# Patient Record
Sex: Female | Born: 1960 | Race: White | Hispanic: No | Marital: Married | Smoking: Former smoker
Health system: Southern US, Community
[De-identification: ages and names within clinical notes are randomized; demographics above are authoritative.]

## PROBLEM LIST (undated history)

## (undated) DIAGNOSIS — J45909 Unspecified asthma, uncomplicated: Secondary | ICD-10-CM

## (undated) DIAGNOSIS — K589 Irritable bowel syndrome without diarrhea: Secondary | ICD-10-CM

## (undated) DIAGNOSIS — R87610 Atypical squamous cells of undetermined significance on cytologic smear of cervix (ASC-US): Secondary | ICD-10-CM

## (undated) DIAGNOSIS — M549 Dorsalgia, unspecified: Secondary | ICD-10-CM

## (undated) HISTORY — PX: ABDOMINAL HYSTERECTOMY: SHX81

## (undated) HISTORY — DX: Atypical squamous cells of undetermined significance on cytologic smear of cervix (ASC-US): R87.610

## (undated) HISTORY — PX: OOPHORECTOMY: SHX86

## (undated) HISTORY — DX: Dorsalgia, unspecified: M54.9

## (undated) HISTORY — PX: PELVIC LAPAROSCOPY: SHX162

## (undated) HISTORY — PX: DILATION AND CURETTAGE OF UTERUS: SHX78

## (undated) HISTORY — PX: OTHER SURGICAL HISTORY: SHX169

## (undated) HISTORY — DX: Irritable bowel syndrome, unspecified: K58.9

## (undated) HISTORY — DX: Unspecified asthma, uncomplicated: J45.909

---

## 1998-08-08 ENCOUNTER — Encounter: Payer: Self-pay | Admitting: Gastroenterology

## 1998-08-08 ENCOUNTER — Ambulatory Visit (HOSPITAL_COMMUNITY): Admission: RE | Admit: 1998-08-08 | Discharge: 1998-08-08 | Payer: Self-pay | Admitting: Gastroenterology

## 2002-04-27 ENCOUNTER — Ambulatory Visit (HOSPITAL_BASED_OUTPATIENT_CLINIC_OR_DEPARTMENT_OTHER): Admission: RE | Admit: 2002-04-27 | Discharge: 2002-04-27 | Payer: Self-pay | Admitting: Orthopedic Surgery

## 2003-10-22 ENCOUNTER — Other Ambulatory Visit: Admission: RE | Admit: 2003-10-22 | Discharge: 2003-10-22 | Payer: Self-pay | Admitting: Obstetrics and Gynecology

## 2003-10-23 ENCOUNTER — Other Ambulatory Visit: Admission: RE | Admit: 2003-10-23 | Discharge: 2003-10-23 | Payer: Self-pay | Admitting: Obstetrics and Gynecology

## 2003-11-28 ENCOUNTER — Encounter (INDEPENDENT_AMBULATORY_CARE_PROVIDER_SITE_OTHER): Payer: Self-pay | Admitting: Specialist

## 2003-11-28 ENCOUNTER — Ambulatory Visit (HOSPITAL_COMMUNITY): Admission: RE | Admit: 2003-11-28 | Discharge: 2003-11-28 | Payer: Self-pay | Admitting: Obstetrics and Gynecology

## 2005-10-29 ENCOUNTER — Encounter (INDEPENDENT_AMBULATORY_CARE_PROVIDER_SITE_OTHER): Payer: Self-pay | Admitting: Specialist

## 2005-10-29 ENCOUNTER — Ambulatory Visit (HOSPITAL_COMMUNITY): Admission: RE | Admit: 2005-10-29 | Discharge: 2005-10-29 | Payer: Self-pay | Admitting: Obstetrics and Gynecology

## 2006-01-06 ENCOUNTER — Inpatient Hospital Stay (HOSPITAL_COMMUNITY): Admission: RE | Admit: 2006-01-06 | Discharge: 2006-01-08 | Payer: Self-pay | Admitting: Obstetrics and Gynecology

## 2006-01-06 ENCOUNTER — Encounter (INDEPENDENT_AMBULATORY_CARE_PROVIDER_SITE_OTHER): Payer: Self-pay | Admitting: Specialist

## 2009-03-06 ENCOUNTER — Encounter: Admission: RE | Admit: 2009-03-06 | Discharge: 2009-03-06 | Payer: Self-pay | Admitting: Family Medicine

## 2009-11-05 ENCOUNTER — Ambulatory Visit: Payer: Self-pay | Admitting: Gynecology

## 2009-11-05 ENCOUNTER — Other Ambulatory Visit: Admission: RE | Admit: 2009-11-05 | Discharge: 2009-11-05 | Payer: Self-pay | Admitting: Gynecology

## 2009-11-11 ENCOUNTER — Ambulatory Visit: Payer: Self-pay | Admitting: Gynecology

## 2010-04-01 ENCOUNTER — Emergency Department (HOSPITAL_COMMUNITY)
Admission: EM | Admit: 2010-04-01 | Discharge: 2010-04-01 | Disposition: A | Discharge status: Home / Self Care | Payer: BC Managed Care – PPO | Attending: Emergency Medicine | Admitting: Emergency Medicine

## 2010-04-01 DIAGNOSIS — IMO0001 Reserved for inherently not codable concepts without codable children: Secondary | ICD-10-CM | POA: Insufficient documentation

## 2010-04-01 DIAGNOSIS — M545 Low back pain, unspecified: Secondary | ICD-10-CM | POA: Insufficient documentation

## 2010-04-01 DIAGNOSIS — M533 Sacrococcygeal disorders, not elsewhere classified: Secondary | ICD-10-CM | POA: Insufficient documentation

## 2010-04-01 DIAGNOSIS — Z79899 Other long term (current) drug therapy: Secondary | ICD-10-CM | POA: Insufficient documentation

## 2010-04-01 DIAGNOSIS — K589 Irritable bowel syndrome without diarrhea: Secondary | ICD-10-CM | POA: Insufficient documentation

## 2010-06-20 NOTE — Op Note (Signed)
NAME:  Deborah Jennings, Deborah Jennings NO.:  [id number]   MEDICAL RECORD NO.:  [id number]          PATIENT TYPE:  AMB   LOCATION:  SDC                           FACILITY:  [facility]   PHYSICIAN:  Malva Limes, M.D.    DATE OF BIRTH:  [birth year]   DATE OF PROCEDURE:  11/28/2003  DATE OF DISCHARGE:                                 OPERATIVE REPORT   PREOPERATIVE DIAGNOSES:  Incomplete abortion at nine weeks estimated  gestational age.   POSTOPERATIVE DIAGNOSES:  Incomplete abortion at nine weeks estimated  gestational age.   PROCEDURE:  Dilation and curettage.   SURGEON:  Malva Limes, M.D.   ANESTHESIA:  MAC with paracervical block.   DRAINS:  None.   SPECIMENS:  Products of conception sent to pathology.   ANTIBIOTICS:  Ancef 1 g.   COMPLICATIONS:  None.   ESTIMATED BLOOD LOSS:  20 mL.   DESCRIPTION OF PROCEDURE:  The patient was taken to the operating room where  she was placed in dorsal supine position. The MAC anesthesia was then  administered, she was placed in dorsal lithotomy position, she was prepped  with Hibiclens and draped in the usual fashion for this procedure. An  examination revealed an anteverted uterus approximately eight weeks in size.  A sterile speculum was placed in the vagina, 15 mL of 1% lidocaine was used  for paracervical block. A single tooth tenaculum was applied to the anterior  cervical lip. The cervical os was then serially dilated to a 21 [place]. An 8  mm suction cannula was placed into the uterine cavity and products of  conception were withdrawn. Sharp curettage was then performed followed by  repeat suction. 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  It appeared  that very little of this fibroid protruded into the cavity.  This concluded  the procedure. The patient was taken to the recovery room in stable  condition. She will be discharged to home with Keflex  500 mg q.i.d. for two  days, Darvocet to take p.r.n.  She will be instructed to followup in the  office in four weeks. She will be given RhoGAM is Rh negative.     Mark   MA/MEDQ  D:  11/28/2003  T:  11/28/2003  Job:  [id number]

## 2010-06-20 NOTE — Discharge Summary (Signed)
NAMEWRENN, WILLCOX               ACCOUNT NO.:  [id number]   MEDICAL RECORD NO.:  [id number]          PATIENT TYPE:  INP   LOCATION:  9302                          FACILITY:  [facility]   PHYSICIAN:  Juluis Mire, M.D.   DATE OF BIRTH:  03-13-1960   DATE OF ADMISSION:  01/06/2006  DATE OF DISCHARGE:  01/08/2006                               DISCHARGE SUMMARY   ADMISSION DIAGNOSIS:  Pelvic pain secondary to pelvic adhesions.   DISCHARGE DIAGNOSIS:  Pelvic pain secondary to pelvic adhesions.   OPERATIVE PROCEDURE:  Diagnostic laparoscopy.  Subsequent exploratory  surgery with total abdominal hysterectomy, left salpingo-oophorectomy.   HISTORY:  For complete history and physical, please see dictated note.   COURSE IN THE HOSPITAL:  After laparoscopy noted that she had  significant pelvic adhesions involving the cul-de-sac, decision to  proceed with exploratory surgery.  We did exploratory laparotomy with  TAH-LSO.  Postoperatively she did excellent.  Postop hemoglobin was  11.8.  Discharged home on the second postop day.  At that time she was  tolerating a regular diet and ambulating without difficulty.  She had a  bowel movement, was voiding without difficulty.  No active vaginal  bleeding.  Incision and abdominal exam were benign.  She was completely  afebrile with stable vital signs.   There were no complications encountered during her stay in the hospital.  The patient was discharged home in stable condition.   DISPOSITION:  The patient to avoid heavy lifting, vaginal entrance or  driving of a car.  She is cautioned to watch for signs of infection,  nausea or vomiting, increased abdominal pain, active vaginal bleeding or  signs of phlebitis or pulmonary embolus.  Discharged home on Tylox as  needed for pain.  She will be followed up early next week in the office  to remove staples.      Juluis Mire, M.D.  Electronically Signed     JSM/MEDQ  D:  01/08/2006  T:   01/08/2006  Job:  [id number]

## 2010-06-20 NOTE — H&P (Signed)
Deborah Jennings, Deborah Jennings               ACCOUNT NO.:  [id number]   MEDICAL RECORD NO.:  [id number]          PATIENT TYPE:  AMB   LOCATION:  SDC                           [facility]ILITY[facility]   PHYSICIAN:  Juluis Mire, M.D.   DATE OF BIRTH:  [birth year]   DATE OF ADMISSION:  01/06/2006  DATE OF DISCHARGE:                              HISTORY & PHYSICAL   The patient is a 50 year old gravida 1, para 1, female who presents for  laparoscopic-assisted vaginal hysterectomy.   In relation to the present admission, the patient's cycles have  generally been between 21 and 28 days.  She has 5 days of flow with 2-  1/2 to 3 days being extremely heavy, changing pads and tampons every  hour with associated clots.  She is also having progressive pain and  discomfort, particularly on the left side.  She has extreme pain with  intercourse.  We had done a previous laparoscopy, hysteroscopy.  We did  find pelvic adhesions.  These were taken down.  Despite this, the  patient continues to have pain requiring higher doses of pain  medication.  In view of this, the patient now presents for  laparoscopically-assisted vaginal hysterectomy.   ALLERGIES:  She is allergic to AMPICILLIN.   MEDICATION:  Levbid.   PAST MEDICAL HISTORY:  The usual childhood diseases.  Does have a  history of irritable bowel syndrome.   PAST SURGICAL HISTORY:  She had the above-noted laparoscopy and  hysteroscopy as noted above.  She has also had a prior cesarean section  for breech presentation.   SOCIAL HISTORY:  No tobacco or alcohol use.   FAMILY HISTORY:  Father has a history of diabetes as well as  hypertension.   REVIEW OF SYSTEMS:  Noncontributory.   PHYSICAL EXAMINATION:  VITAL SIGNS:  The patient is afebrile with stable  vital signs.  HEENT:  Patient normocephalic.  Pupils equal, round and reactive to  light and accommodation.  Extraocular movements were intact.  Sclerae  and conjunctivae clear.  Oropharynx clear.  NECK:  Without thyromegaly.  BREASTS:  No discrete masses.  LUNGS:  Clear.  CARDIAC:  Regular rate and rhythm, no murmurs or gallops.  ABDOMEN:  Benign.  Does have left-sided tenderness.  PELVIC:  Normal external genitalia.  Vaginal mucosa clear.  Cervix  unremarkable.  Uterus upper limits of normal size, moderately tender.  Adnexa unremarkable.  EXTREMITIES:  Trace edema.  NEUROLOGIC:  Grossly within normal limits.   IMPRESSION:  Continued abnormal bleeding and pelvic pain secondary to  adenomyosis and pelvic adhesions.   PLAN:  The patient will undergo a laparoscopically-assisted vaginal  hysterectomy with left salpingo-oophorectomy.  She does understand that  this may not completely relieve pain symptoms.  The risks of the  procedure have been discussed, including the risk of infection; the risk  of hemorrhage that could require transfusion with the risk of AIDS or  hepatitis.  There is a risk of injury to adjacent organs including the  bladder, bowel or ureters that could require further exploratory  surgery.  Risk of deep venous thrombosis and  pulmonary emboli.  There is  also a risk of injury with placement during gynecological procedures  that can lead to nerve or muscular damage.  The patient expressed an  understanding of indications and risks.      Juluis Mire, M.D.  Electronically Signed     JSM/MEDQ  D:  01/06/2006  T:  01/06/2006  Job:  [id number]

## 2010-06-20 NOTE — Op Note (Signed)
NAME:  Deborah Jennings, Deborah Jennings                       ACCOUNT NO.:  [id number]   MEDICAL RECORD NO.:  [id number]                   PATIENT TYPE:  AMB   LOCATION:  DSC                                  FACILITY:  [facility]   PHYSICIAN:  Katy Fitch. Naaman Plummer., M.D.          DATE OF BIRTH:  Jun 02, 1960   DATE OF PROCEDURE:  04/27/2002  DATE OF DISCHARGE:                                 OPERATIVE REPORT   PREOPERATIVE DIAGNOSIS:  Entrapment neuropathy, median nerve, right carpal  tunnel.   POSTOPERATIVE DIAGNOSIS:  Entrapment neuropathy, median nerve, right carpal  tunnel.   PROCEDURE:  Release of right transverse carpal ligament.   SURGEON:  Katy Fitch. Sypher, M.D.   ASSISTANT:  Jonni Sanger, P.A.   ANESTHESIA:  General by LMA supervised by the anesthesiologist, Maren Beach, M.D.   INDICATIONS:  The patient is a 50 year old woman who presented for  evaluation and management of numbness in her right hand.  Clinical  examination revealed signs of carpal tunnel syndrome.  Electrodiagnostic  studies confirmed median neuropathy at the level of the right wrist.   After failure to respond to nonoperative measures, she is brought to the  operating room at this time for release of her right transverse carpal  ligament.   DESCRIPTION OF PROCEDURE:  The patient was brought to the operating room and  placed in the supine position on the operating table.  Following induction  of general anesthesia by LMA, the right arm was prepped with Betadine soap  and solution and sterilely draped.   Following exsanguination of the limb with an Esmarch bandage, the arterial  tourniquet was inflated to 220 mmHg.  The procedure commenced with a short  incision in the line of the ring finger of the palm.  The subcutaneous  tissues were carefully divided to reveal the palmar fascia.  This was split  longitudinally to reveal the common sensory branch of the median nerve.   These were followed back to the  transverse carpal ligament, which was  carefully isolated from the median nerve.   Bleeding points along the margin of the released ligament were  electrocauterized with bipolar current, followed by repair of the skin with  intradermal 3-0 Prolene suture.   A compressive dressing applied with a volar plaster splint maintaining the  wrist in 5 degrees of dorsiflexion.   For aftercare the patient is given a prescription for Percocet 5 mg one or  two tablets p.o. q.4-6h. p.r.n. pain, 20 tablets without refill.   She will return to our office for follow-up in seven to 10 days for dressing  change and advancement to an exercise program.  Anticipate suture removal at  10-14 days.  Katy Fitch Naaman Plummer., M.D.    RVS/MEDQ  D:  04/27/2002  T:  04/28/2002  Job:  [id number]

## 2010-06-20 NOTE — Op Note (Signed)
Deborah Jennings, Deborah Jennings               ACCOUNT NO.:  [id number]   MEDICAL RECORD NO.:  [id number]          PATIENT TYPE:  AMB   LOCATION:  SDC                           [facility]ACILITY[facility]   PHYSICIAN:  Juluis Mire, M.D.   DATE OF BIRTH:  [birth year]   DATE OF PROCEDURE:  01/06/2006  DATE OF DISCHARGE:                               OPERATIVE REPORT   PREOPERATIVE DIAGNOSIS:  Pelvic pain secondary to pelvic adhesions.   POSTOPERATIVE DIAGNOSIS:  Pelvic pain secondary to pelvic adhesions.   OPERATIVE PROCEDURE:  1. Open laparoscopy.  2. Attempt at lysis of adhesions.  3. Subsequent total abdominal hysterectomy with left salpingo-      oophorectomy.   SURGEON:  Juluis Mire, M.D.   ASSISTANT:  Duke Salvia. Marcelle Overlie, M.D.   ANESTHESIA:  General endotracheal.   ESTIMATED BLOOD LOSS:  300-400 mL.   PACKS AND DRAINS:  None.   INTRAOPERATIVE BLOOD REPLACEMENT:  None.   COMPLICATIONS:  None.   INDICATIONS:  In dictated history and physical.   PROCEDURE:  The patient was taken to the OR and placed in the supine  position.  After a satisfactory level of general endotracheal anesthesia  was obtained, the patient was placed in dorsal lithotomy position using  the Allen stirrups.  The abdomen, perineum and vagina were prepped out  with Betadine.  Bladder was emptied by in-and-out catheterization.  A  Hulka tenaculum was put in place and secured.  The patient then draped  in a sterile field.  A subumbilical incision made with a knife and  extended through the subcutaneous tissue.  The fascia was identified,  entered sharply and the incision in fascia was extended laterally.  We  entered the peritoneum with blunt finger pressure.  The Taut open  laparoscopic trocar was put in place and secured.  The laparoscope was  introduced.  There was no evidence of injury to adjacent organs.  A 5-mm  trocar was put in place in the suprapubic area under direct  visualization.  We put in a third 5-mm  trocar in the left lower quadrant  because of the pelvic adhesions.  At this point in time, we elevated the  uterus.  We were able to free up the left ovary and the right ovary  appeared to be normal, except for some implants of endometriosis.  However, at this point in time, the cul-de-sac had become completely  obliterated between adhesions from the sigmoid colon to the back to the  uterus and the left pelvic sidewall.  These appeared to be dense and we  felt like we could not approach these through the laparoscope; the  decision was to proceed with total abdominal hysterectomy.  At this  point in time, the abdomen was deflated of its carbon dioxide and all  trocars removed.  The patient's legs were repositioned.  The Hulka  tenaculum was removed.  A Foley was placed to straight drain.  A low  transverse skin incision was made with a knife and carried through  subcutaneous tissue.  The anterior rectus fascia was entered sharply and  incision in the fascia was extended laterally.  The fascia was then  taken off the muscle superiorly and inferiorly.  Muscles were separated  in the midline.  Peritoneum was entered and incision in the peritoneum  extended both superiorly and inferiorly.  O'Connor-O'Sullivan retractor  was put in place and bowel contents were packed superiorly out of the  pelvic cavity.  Utero-ovarian ligaments were clamped with Kellys.  The  uterus was then elevated.  We were able to dissect the sigmoid colon off  the back the uterus and developed the cul-de-sac at this point using  both blunt and sharp dissection; there was no evidence of injury to the  bowel.  At this point in time, the right round ligament was clamped, cut  and suture-ligated with 0 Vicryl.  The right utero-ovarian pedicle was  developed, clamped, cut and doubly ligated, first with a free tie of 0  Vicryl, then a suture ligature of 0 Vicryl.  Next, the left round  ligament was clamped, cut and  suture-ligated with 0 Vicryl.  We isolated  the ovarian vasculature above the ureter; it was clamped and cut and  doubly ligated first with free tie of 0 Vicryl, then a suture ligature  of 0 Vicryl.  Bladder flap was then developed. Uterine vessels were  skeletonized, clamped, cut and suture-ligated with 0 Vicryl.  Using the  clamp, cut and tie technique with suture ligatures of 0 Vicryl, the  parametrium was serially separated from the sides of the uterus.  We  made sure that the colon was out of the way.  Next, both vaginal angles  were clamped and cut, intervening vaginal mucosa was excised, uterus and  cervix and left ovary passed off the operative field and sent to  Pathology.  At this point in time, vaginal angles were secured with a  suture ligature of 0 Vicryl.  The intervening vaginal mucosa was closed  with a running suture of 0 Vicryl.  We thoroughly irrigated the pelvis;  we had good hemostasis.  The appendix was visualized and noted be  normal.  The right ovary was hemostatically intact and out of the  pelvis.  The left ovarian vasculature was hemostatically intact.  She  had minimal urine output throughout the case; however, it was clear.  At  this point in time, all laps and self-retaining tractors removed.  Muscles and peritoneum were closed with a running suture of 2-0 Vicryl,  fascia closed with a running suture of 0 PDS and skin was closed with  staples and Steri-Strips.  At this point in time, subumbilical fascia  was closed with 2 figure-of-eights of 0 Vicryl, skin with interrupted  subcuticulars of 4-0 Vicryl.  The suprapubic and lateral 5-mm trocar  incisions were closed with Dermabond.  The patient was taken out of the  dorsal lithotomy position and once alert and extubated, was transferred  to the recovery room in good condition.  Sponge, instrument and needle  counts were reported as correct by circulating nurse x2.     Juluis Mire, M.D.  Electronically  Signed     JSM/MEDQ  D:  01/06/2006  T:  01/06/2006  Job:  [id number]

## 2010-06-20 NOTE — H&P (Signed)
Deborah Jennings, Deborah Jennings               ACCOUNT NO.:  [id number]   MEDICAL RECORD NO.:  [id number]          PATIENT TYPE:  AMB   LOCATION:  SDC                           FACILITY:  [facility]   PHYSICIAN:  Juluis Mire, M.D.   DATE OF BIRTH:  1960-04-16   DATE OF ADMISSION:  10/29/2005  DATE OF DISCHARGE:                                HISTORY & PHYSICAL   A 50 year old  gravida 1, para 1 married female presents for hysteroscopy  with laparoscopy with laser standby.   In relation to the present admission the patient's cycles have generally  been between 21 at 28 days.  She has 5 days of flow, 2-1/2 days to 3 days  being extremely heavy, changing pads and tampons every hour with clots.  She  is also reporting increasing abdominal pain and bloating, particularly on  the left side.  With intercourse she also has discomfort on that side.  Ultrasound evaluation; it looked like the ovaries were somewhat fixed in the  pelvis.  Findings highly suggestive of endometriosis.  The patient now  presents for the above-noted surgery.   ALLERGIES:  IN TERMS OF ALLERGIES ALLERGIC TO AMPICILLIN.   MEDICATIONS:  Levbid.   PAST MEDICAL HISTORY:  Usual childhood diseases.  Does have a history of  irritable bowel syndrome.   SOCIAL HISTORY:  Reveals no tobacco or alcohol use.   FAMILY HISTORY:  Strong history of diabetes and hypertension.   REVIEW OF SYSTEMS:  Noncontributory.   PHYSICAL EXAMINATION:  The patient is afebrile with stable vital signs.  HEENT:  The patient is normocephalic.  Pupils equal, round, reactive to  light and accommodation.  Extraocular movements were intact.  Sclerae and  conjunctiva clear.  Oropharynx clear.  NECK:  Without thyromegaly.  BREASTS :  No discrete masses.  LUNGS:  Clear.  CARDIOVASCULAR:  Regular rhythm and rate without murmurs, rubs or gallops.  ABDOMEN:  Benign.  No mass, organomegaly or tenderness.  PELVIC:  Normal external genitalia.  Vaginal mucosa is clear.   Cervix is  unremarkable.  Uterus is of normal size and shape.  Uterus is somewhat  fixed.  Adnexa unremarkable.  EXTREMITIES:  Trace edema.  NEUROLOGIC:  Grossly within normal limits.   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   PLAN:  The patient will undergo hysteroscopy with subsequent diagnostic  laparoscopy with laser standby.  The overall risks of surgery have been  discussed including the risk of infection.  The risk of hemorrhage which  could  require transfusion with the risk of AIDS or hepatitis.  The risk of injury  to adjacent organs requiring further exploratory surgery.  The risk of deep  venous thrombosis and pulmonary emboli.  The patient voiced understanding of  indications and risks and accepting of them.      Juluis Mire, M.D.  Electronically Signed     JSM/MEDQ  D:  10/29/2005  T:  10/30/2005  Job:  [id number]

## 2010-06-20 NOTE — Op Note (Signed)
Deborah Jennings, Deborah Jennings               ACCOUNT NO.:  [id number]   MEDICAL RECORD NO.:  [id number]          PATIENT TYPE:  AMB   LOCATION:  SDC                           FACILITY:  [facility]   PHYSICIAN:  Juluis Mire, M.D.   DATE OF BIRTH:  [birth year]   DATE OF PROCEDURE:  10/29/2005  DATE OF DISCHARGE:  10/29/2005                                 OPERATIVE REPORT   PREOPERATIVE DIAGNOSIS:  Abnormal bleeding and pelvic pain.   POSTOPERATIVE DIAGNOSIS:  Pelvic adhesions.   OPERATIVE PROCEDURE:  Hysteroscopy with D&C. Laparoscopy with lysis of  adhesions.   SURGEON:  Juluis Mire, M.D.   ANESTHESIA:  General.   ESTIMATED BLOOD LOSS:  Minimal.   PACKS AND DRAINS:  None.   INTRAOPERATIVE BLOOD REPLACED:  None.   COMPLICATIONS:  None.   INDICATIONS:  Dictated in history and physical.   PROCEDURE IN DETAIL:  The patient was taken to the OR and placed in supine  position. After satisfactory level of general endotracheal anesthesia was  obtained, the patient was placed in a dorsal lithotomy position using the  Allen stirrups. The abdomen, perineum and vagina were prepped out with  Betadine. The patient was then draped out for hysteroscopy. Speculum was  placed in the vaginal vault. Cervix was grasped with a single-toothed  tenaculum. Uterus sounded to approximately 11 cm. Cervix was dilated to a  size 27 Pratt dilator. Nonoperative hysteroscopy was introduced.  Intrauterine cavity was distended using Sorbitol. Visualization revealed  normal endometrial cavity. There was no polyps or abnormalities.  Hysteroscope was then removed. Total deficit was 60 cc. Endometrial  curettings were then obtained and sent for pathological review. The Hulka  tenaculum was then put in place and secured.   The patient's legs were repositioned, and the patient was redraped. A  subumbilical incision was made with a knife. Veress needle was introduced  into the abdominal pain. The abdomen was insufflated  with approximately 3  liters of carbon dioxide. The operating laparoscope was introduced into the  abdominal cavity without difficulty. A 5-mm trocar was put into place in the  suprapubic area under direct visualization. There was no evidence of injury  to adjacent organs. Appendix was seen and found to be normal. Upper abdomen  including liver tip and gallbladder were cleared. Both lateral gutters were  cleared. The uterus was elevated. The uterus was the upper limits of normal  size, irregular, consistent with adenomyosis. Both ovaries were adherent  from the cul-de-sac. We were able to free using blunt and sharp dissection  both ovaries from the pelvic side wall. They were otherwise unremarkable. No  active endometriosis noted. Sigmoid colon was somewhat adherent to the  posterior aspect of the uterus and the cul-de-sac, at least in the lower  portion. This was left in place. We used a bipolar to control any bleeding  from the ovaries. No other pelvic pathology was noted at this point in time.  The pelvic cavity was thoroughly irrigated. We had good hemostasis. The  abdomen was deflated of carbon dioxide. All trocars removed. Subumbilical  incisions were  closed with interrupted subcuticular of 4-0 Vicryl. The  suprapubic incisions were closed with Dermabond. The Hulka tenaculum was  then removed. The patient was taken out of the dorsal lithotomy position.  Once alert and extubated, transferred to the recovery room in good  condition. Sponge, instrument and needle count ______________ circulating  nurse x2.      Juluis Mire, M.D.  Electronically Signed     JSM/MEDQ  D:  10/29/2005  T:  10/31/2005  Job:  [id number]

## 2011-02-09 ENCOUNTER — Encounter: Payer: Self-pay | Admitting: Gynecology

## 2011-02-11 ENCOUNTER — Other Ambulatory Visit: Payer: Self-pay | Admitting: *Deleted

## 2011-02-11 DIAGNOSIS — R921 Mammographic calcification found on diagnostic imaging of breast: Secondary | ICD-10-CM

## 2011-02-13 ENCOUNTER — Other Ambulatory Visit: Payer: Self-pay | Admitting: *Deleted

## 2011-02-13 ENCOUNTER — Other Ambulatory Visit: Payer: Self-pay | Admitting: Gynecology

## 2011-02-13 DIAGNOSIS — R921 Mammographic calcification found on diagnostic imaging of breast: Secondary | ICD-10-CM

## 2011-02-13 DIAGNOSIS — R928 Other abnormal and inconclusive findings on diagnostic imaging of breast: Secondary | ICD-10-CM

## 2011-02-16 ENCOUNTER — Other Ambulatory Visit: Payer: Self-pay | Admitting: Radiology

## 2011-02-17 ENCOUNTER — Other Ambulatory Visit: Payer: Self-pay | Admitting: Gynecology

## 2011-02-17 DIAGNOSIS — R928 Other abnormal and inconclusive findings on diagnostic imaging of breast: Secondary | ICD-10-CM

## 2011-02-17 DIAGNOSIS — R921 Mammographic calcification found on diagnostic imaging of breast: Secondary | ICD-10-CM

## 2011-03-16 ENCOUNTER — Encounter: Payer: Self-pay | Admitting: Gynecology

## 2011-03-16 DIAGNOSIS — K589 Irritable bowel syndrome without diarrhea: Secondary | ICD-10-CM | POA: Insufficient documentation

## 2011-03-16 DIAGNOSIS — N809 Endometriosis, unspecified: Secondary | ICD-10-CM | POA: Insufficient documentation

## 2011-03-17 ENCOUNTER — Encounter: Payer: BC Managed Care – PPO | Admitting: Gynecology

## 2011-03-25 ENCOUNTER — Encounter: Payer: Self-pay | Admitting: Gynecology

## 2011-03-25 ENCOUNTER — Ambulatory Visit (INDEPENDENT_AMBULATORY_CARE_PROVIDER_SITE_OTHER): Payer: Managed Care, Other (non HMO) | Admitting: Gynecology

## 2011-03-25 VITALS — BP 138/94 | Ht 65.0 in | Wt 179.0 lb

## 2011-03-25 DIAGNOSIS — N907 Vulvar cyst: Secondary | ICD-10-CM

## 2011-03-25 DIAGNOSIS — Z01419 Encounter for gynecological examination (general) (routine) without abnormal findings: Secondary | ICD-10-CM

## 2011-03-25 DIAGNOSIS — G43909 Migraine, unspecified, not intractable, without status migrainosus: Secondary | ICD-10-CM | POA: Insufficient documentation

## 2011-03-25 DIAGNOSIS — G8929 Other chronic pain: Secondary | ICD-10-CM

## 2011-03-25 DIAGNOSIS — R51 Headache: Secondary | ICD-10-CM

## 2011-03-25 DIAGNOSIS — F411 Generalized anxiety disorder: Secondary | ICD-10-CM

## 2011-03-25 DIAGNOSIS — N9089 Other specified noninflammatory disorders of vulva and perineum: Secondary | ICD-10-CM

## 2011-03-25 DIAGNOSIS — N951 Menopausal and female climacteric states: Secondary | ICD-10-CM

## 2011-03-25 DIAGNOSIS — F419 Anxiety disorder, unspecified: Secondary | ICD-10-CM

## 2011-03-25 MED ORDER — BUTALBITAL-ASA-CAFFEINE 50-325-40 MG PO CAPS
1.0000 | ORAL_CAPSULE | Freq: Two times a day (BID) | ORAL | Status: AC | PRN
Start: 2011-03-25 — End: 2011-04-04

## 2011-03-25 MED ORDER — LEVALBUTEROL HCL 0.63 MG/3ML IN NEBU: 1.0000 | INHALATION_SOLUTION | Freq: Once | RESPIRATORY_TRACT | Status: DC

## 2011-03-25 MED ORDER — ALPRAZOLAM 0.5 MG PO TABS: 0.5000 mg | ORAL_TABLET | Freq: Every evening | ORAL | Status: AC | PRN

## 2011-03-25 NOTE — Progress Notes (Signed)
Deborah Jennings [birth year] [id number]        51 y.o.  for annual exam.  Number of issues noted below.  Past medical history,surgical history, medications, allergies, family history and social history were all reviewed and documented in the EPIC chart. ROS:  Was performed and pertinent positives and negatives are included in the history.  Exam: Sherrilyn Rist chaperone present Filed Vitals:   03/25/11 1129  BP: 138/94   General appearance  Normal Skin grossly normal Head/Neck normal with no cervical or supraclavicular adenopathy thyroid normal Lungs  Bilateral mid to lower lung wheezing Cardiac RR, without RMG Abdominal  soft, nontender, without masses, organomegaly or hernia Breasts  examined lying and sitting without masses, retractions, discharge or axillary adenopathy.  Small area at needle biopsy site upper right breast noted Pelvic  Ext/BUS/vagina  A number of small classic sebaceous cysts over both labia majora bilaterally  Adnexa  Without masses or tenderness    Anus and perineum  normal   Rectovaginal  normal sphincter tone without palpated masses or tenderness.    Assessment/Plan:  51 y.o. female for annual exam.    1. Patient having extreme anxiety associated with menopausal symptoms. Husband is on disability and she has been laid off and having a lot of stress at home. She asked about Xanax which she has used in the past. I discussed other possibilities to include Prozac/Cymbalta both of which she has tried apparently without success. She normally sees Tomi Bamberger for this but cannot see her at present as Darl Pikes is setting up a new office and unable to see patients at this time.  I gave her a prescription for Xanax 0.5 mg #30 no refill and she is making an appointment to see her primary within the month. I did stress to her I think she needs to be on something more consistent and she can discuss this with Darl Pikes. 2. Menopausal symptoms. We'll check FSH/TSH. If menopausal I reviewed  options to include ERT. I discussed the WHI study increased risk of stroke heart attack DVT and possible increased risk of breast cancer. The ACOG and NAMS statements for lowest dose the shortest period of time. Given the symptoms of hot flushes, sweats and her emotional lability I think ERT would be appropriate and she agrees. I would start with a transdermal to avoid the first pass effect and she agrees with this. She will follow up for her hormone levels. 3. Headaches. Patient has a long history since teenage years of migraines and is out of her medication. She uses Fiorinal which seems to help her and I gave her a no refill prescription to get her through until she sees her primary who typically manages this for her. 4. Asthma. Patient has history of asthma with her smoking history. Her exam today shows bilateral wheezing. I refilled her bronchodilator she ran out and again she will follow up with her primary in reference to this. 5. Elevated blood pressure.  Patient's blood pressure is 138/94. She says it tends to go up when she is stressed. Again I urged her to follow up with her primary to follow and manage this and she agrees with this. 6. Mammography. Patient had recent mammogram with follow up biopsy which was benign. The small area on her breast I think is due to healing from this biopsy site and she will continue to follow this as long as it doesn't change or resolves then she'll follow.  She'll follow up for mammogram in 6 months  as they have recommended.  SBE on a monthly basis reviewed. 7. Vulvar cysts. She has a number of classic sebaceous cysts on her vulva which she has had and are not bothersome to her and she'll continue to monitor. 8. Pap smear. I did not do a Pap smear today. Her last Pap smear was October 2011. She has no history of abnormal Pap smears before. I reviewed current screening guidelines and the options to stop altogether she is status post hysterectomy for benign indications  versus doing a less frequent interval was reviewed and we'll readdress this on an annual basis. 9. Health maintenance. No other blood work besides her TSH FSH was done issues could have this all done through her primary physician's office.  Dara Lords MD, 1:11 PM 03/25/2011

## 2011-03-25 NOTE — Patient Instructions (Signed)
Follow up with her primary physician as we have discussed. We will call you with your hormone results.

## 2011-03-26 ENCOUNTER — Telehealth: Payer: Self-pay | Admitting: Gynecology

## 2011-03-26 LAB — FOLLICLE STIMULATING HORMONE: FSH: 84.2 m[IU]/mL

## 2011-03-26 LAB — TSH: TSH: 0.841 u[IU]/mL (ref 0.350–4.500)

## 2011-03-26 MED ORDER — ESTRADIOL 0.05 MG/24HR TD PTTW: 1.0000 | MEDICATED_PATCH | TRANSDERMAL | Status: DC

## 2011-03-26 NOTE — Telephone Encounter (Signed)
Left message for pt to call.

## 2011-03-26 NOTE — Telephone Encounter (Signed)
Tell patient that her FSH did come back elevated consistent with menopause. I do think that estrogen replacement would be a good idea given her situation. Again she would have to accept the risks of stroke, heart attack, DVT and the issue of breast cancer. Make sure that she follows up with her primary in reference to her blood pressure particularly in I'm going to prescribe the estrogen patch 0.05 mg that she can try. Ask her to follow up with me in several weeks just to see how she's doing.

## 2011-03-26 NOTE — Telephone Encounter (Signed)
Pt informed with the below note, she will call back to follow up.

## 2011-04-30 ENCOUNTER — Other Ambulatory Visit: Payer: Self-pay | Admitting: *Deleted

## 2011-04-30 MED ORDER — ESTRADIOL 0.05 MG/24HR TD PTTW: 1.0000 | MEDICATED_PATCH | TRANSDERMAL | Status: DC

## 2011-04-30 NOTE — Progress Notes (Signed)
Pharmacy requested 90-day rx

## 2013-06-05 ENCOUNTER — Ambulatory Visit
Admission: RE | Admit: 2013-06-05 | Discharge: 2013-06-05 | Disposition: A | Discharge status: Home / Self Care | Payer: BC Managed Care – PPO | Source: Ambulatory Visit | Attending: Nurse Practitioner | Admitting: Nurse Practitioner

## 2013-06-05 ENCOUNTER — Other Ambulatory Visit: Payer: Self-pay | Admitting: Nurse Practitioner

## 2013-06-05 DIAGNOSIS — R0602 Shortness of breath: Secondary | ICD-10-CM

## 2013-06-05 DIAGNOSIS — R059 Cough, unspecified: Secondary | ICD-10-CM

## 2013-06-05 DIAGNOSIS — R05 Cough: Secondary | ICD-10-CM

## 2013-06-05 DIAGNOSIS — R0989 Other specified symptoms and signs involving the circulatory and respiratory systems: Secondary | ICD-10-CM

## 2013-07-14 ENCOUNTER — Encounter: Payer: Self-pay | Admitting: Gynecology

## 2013-08-25 ENCOUNTER — Encounter: Payer: Self-pay | Admitting: Gynecology

## 2013-11-27 ENCOUNTER — Encounter (HOSPITAL_COMMUNITY): Payer: Self-pay | Admitting: Emergency Medicine

## 2013-11-27 ENCOUNTER — Emergency Department (HOSPITAL_COMMUNITY)
Admission: EM | Admit: 2013-11-27 | Discharge: 2013-11-27 | Disposition: A | Discharge status: Home / Self Care | Payer: BC Managed Care – PPO | Source: Home / Self Care | Attending: Emergency Medicine | Admitting: Emergency Medicine

## 2013-11-27 ENCOUNTER — Emergency Department (INDEPENDENT_AMBULATORY_CARE_PROVIDER_SITE_OTHER): Payer: BC Managed Care – PPO

## 2013-11-27 DIAGNOSIS — S82841A Displaced bimalleolar fracture of right lower leg, initial encounter for closed fracture: Secondary | ICD-10-CM

## 2013-11-27 DIAGNOSIS — R52 Pain, unspecified: Secondary | ICD-10-CM

## 2013-11-27 DIAGNOSIS — W19XXXA Unspecified fall, initial encounter: Secondary | ICD-10-CM

## 2013-11-27 DIAGNOSIS — S43421A Sprain of right rotator cuff capsule, initial encounter: Secondary | ICD-10-CM

## 2013-11-27 MED ORDER — HYDROCODONE-ACETAMINOPHEN 5-325 MG PO TABS: ORAL_TABLET | ORAL | Status: DC

## 2013-11-27 MED ORDER — ONDANSETRON 4 MG PO TBDP
8.0000 mg | ORAL_TABLET | Freq: Once | ORAL | Status: AC
  Administered 2013-11-27: 8 mg via ORAL

## 2013-11-27 MED ORDER — HYDROMORPHONE HCL 1 MG/ML IJ SOLN
2.0000 mg | Freq: Once | INTRAMUSCULAR | Status: AC
  Administered 2013-11-27: 2 mg via INTRAMUSCULAR

## 2013-11-27 MED ORDER — HYDROCODONE-ACETAMINOPHEN 5-325 MG PO TABS
ORAL_TABLET | ORAL | Status: AC
  Filled 2013-11-27: qty 2

## 2013-11-27 MED ORDER — HYDROMORPHONE HCL 1 MG/ML IJ SOLN
INTRAMUSCULAR | Status: AC
  Filled 2013-11-27: qty 2

## 2013-11-27 MED ORDER — ONDANSETRON 4 MG PO TBDP
ORAL_TABLET | ORAL | Status: AC
  Filled 2013-11-27: qty 2

## 2013-11-27 MED ORDER — HYDROCODONE-ACETAMINOPHEN 5-325 MG PO TABS
2.0000 | ORAL_TABLET | Freq: Once | ORAL | Status: AC
  Administered 2013-11-27: 2 via ORAL

## 2013-11-27 NOTE — Discharge Instructions (Signed)
Most shoulder pain is caused by soft tissue problems rather than arthritis.  Rotator cuff tendonitis or tendonosis, rotator cuff tears, impingement syndrome and cartilege (labrum tears) are a few of the common causes of shoulder pain.  Fortunately, most of these can be treated with conservative measures as outlined below.  Do not do the following:  Doing any work with the arms above shoulder level (especially lifting) until the pain has subsided.  Sleeping on the affected side.  Especially avoid sleeping with your arm under your head or your pillow.  This is a habit that is hard to break.  Some people have to pin the arm of their pajamas to the chest area to prevent this.  Do the following:  Do the shoulder exercises below twice daily followed by ice for 10 minutes.  If no better in 1 month, follow up here, with your primary care doctor, or with an orthopedist.  Use of over the counter pain meds can be of help.  Tylenol (or acetaminophen) is the safest to use.  It often helps to take this regularly.  You can take up to 2 325 mg tablets 5 times daily, but it best to start out much lower that that, perhaps 2 325 mg tablets twice daily, then increase from there. People who are on the blood thinner warfarin have to be careful about taking high doses of Tylenol.  For people who are able to tolerate them, ibuprofen and Aleve can also help with the pain.  You should discuss these agents with your physician before taking them.  People with chronic kidney disease, hypertension, peptic ulcer disease, and reflux can suffer adverse side effects. They should not be taken with warfarin. The maximum dosage of ibuprofen is 800 mg 3 times daily with meals.  The maximum dosage of Aleve is 2 and 1/2 tablets twice daily with food, but again, start out low and gradually increase the dose until adequate pain relief is achieved. Ibuprofen and Aleve should always be taken with food.        Ankle Fracture A fracture  is a break in a bone. The ankle joint is made up of three bones. These include the lower (distal)sections of your lower leg bones, called the tibia and fibula, along with a bone in your foot, called the talus. Depending on how bad the break is and if more than one ankle joint bone is broken, a cast or splint is used to protect and keep your injured bone from moving while it heals. Sometimes, surgery is required to help the fracture heal properly.  There are two general types of fractures:  Stable fracture. This includes a single fracture line through one bone, with no injury to ankle ligaments. A fracture of the talus that does not have any displacement (movement of the bone on either side of the fracture line) is also stable.  Unstable fracture. This includes more than one fracture line through one or more bones in the ankle joint. It also includes fractures that have displacement of the bone on either side of the fracture line. CAUSES  A direct blow to the ankle.   Quickly and severely twisting your ankle.  Trauma, such as a car accident or falling from a significant height. RISK FACTORS You may be at a higher risk of ankle fracture if:  You have certain medical conditions.  You are involved in high-impact sports.  You are involved in a high-impact car accident. SIGNS AND SYMPTOMS   Tender  and swollen ankle.  Bruising around the injured ankle.  Pain on movement of the ankle.  Difficulty walking or putting weight on the ankle.  A cold foot below the site of the ankle injury. This can occur if the blood vessels passing through your injured ankle were also damaged.  Numbness in the foot below the site of the ankle injury. DIAGNOSIS  An ankle fracture is usually diagnosed with a physical exam and X-rays. A CT scan may also be required for complex fractures. TREATMENT  Stable fractures are treated with a cast or splint and using crutches to avoid putting weight on your injured  ankle. This is followed by an ankle strengthening program. Some patients require a special type of cast, depending on other medical problems they may have. Unstable fractures require surgery to ensure the bones heal properly. Your health care provider will tell you what type of fracture you have and the best treatment for your condition. HOME CARE INSTRUCTIONS   Review correct crutch use with your health care provider and use your crutches as directed. Safe use of crutches is extremely important. Misuse of crutches can cause you to fall or cause injury to nerves in your hands or armpits.  Do not put weight or pressure on the injured ankle until directed by your health care provider.  To lessen the swelling, keep the injured leg elevated while sitting or lying down.  Apply ice to the injured area:  Put ice in a plastic bag.  Place a towel between your cast and the bag.  Leave the ice on for 20 minutes, 2-3 times a day.  If you have a plaster or fiberglass cast:  Do not try to scratch the skin under the cast with any objects. This can increase your risk of skin infection.  Check the skin around the cast every day. You may put lotion on any red or sore areas.  Keep your cast dry and clean.  If you have a plaster splint:  Wear the splint as directed.  You may loosen the elastic around the splint if your toes become numb, tingle, or turn cold or blue.  Do not put pressure on any part of your cast or splint; it may break. Rest your cast only on a pillow the first 24 hours until it is fully hardened.  Your cast or splint can be protected during bathing with a plastic bag sealed to your skin with medical tape. Do not lower the cast or splint into water.  Take medicines as directed by your health care provider. Only take over-the-counter or prescription medicines for pain, discomfort, or fever as directed by your health care provider.  Do not drive a vehicle until your health care  provider specifically tells you it is safe to do so.  If your health care provider has given you a follow-up appointment, it is very important to keep that appointment. Not keeping the appointment could result in a chronic or permanent injury, pain, and disability. If you have any problem keeping the appointment, call the facility for assistance. SEEK MEDICAL CARE IF: You develop increased swelling or discomfort. SEEK IMMEDIATE MEDICAL CARE IF:   Your cast gets damaged or breaks.  You have continued severe pain.  You develop new pain or swelling after the cast was put on.  Your skin or toenails below the injury turn blue or gray.  Your skin or toenails below the injury feel cold, numb, or have loss of sensitivity to touch.  There is a bad smell or pus draining from under the cast. MAKE SURE YOU:   Understand these instructions.  Will watch your condition.  Will get help right away if you are not doing well or get worse. Document Released: 01/17/2000 Document Revised: 01/24/2013 Document Reviewed: 08/18/2012 [facility] Patient Information 2015 [place], [place]. This information is not intended to replace advice given to you by your health care provider. Make sure you discuss any questions you have with your health care provider.

## 2013-11-27 NOTE — ED Provider Notes (Signed)
Chief Complaint   Ankle Pain   History of Present Illness   Deborah Jennings is a 53 year old female who stepped off a porch at her mother-in-law's house yesterday, and states her right ankle gave way underneath her, she rolled it inward and she fell to the ground. She did not hear a pop or snap. Ever since then she's had pain over both the medial and lateral malleoli, swelling, and is been unable to bear weight. She did not hit her head and there was no loss of consciousness. She also injured her right shoulder. The shoulder has good range of motion but with pain on abduction and flexion. The right foot feels numb and tingly. There is no numbness or weakness in the right arm.  Review of Systems   Other than as noted above, the patient denies any of the following symptoms: Systemic:  No fevers or chills.   Musculoskeletal:  No joint pain or swelling, back pain, or neck pain. Neurological:  No muscular weakness or paresthesias.  [place]   Past medical history, family history, social history, meds, and allergies were reviewed. She is allergic to ampicillin. She takes omeprazole and Xanax. She has asthma and migraine headaches.  Physical Examination     Vital signs:  BP 158/98  Pulse 80  Temp(Src) 99.1 F (37.3 C) (Oral)  Resp 20  SpO2 98% Gen:  Alert and oriented times 3.  In no distress. Musculoskeletal: Exam of the ankle reveals there is market swelling and pain to palpation over both medial and lateral The ankle has minimal range of motion with pain on slight movement. Anterior drawer sign negative.  Talar tilt could not be done. Squeeze test positive. Achilles tendon, peroneal tendon, and tibialis posterior were intact. Otherwise, all joints had a full a ROM with no swelling, bruising or deformity.  No edema, pulses full. Extremities were warm and pink.  Capillary refill was brisk.  Skin:  Clear, warm and dry.  No rash. Neuro:  Alert and oriented times 3.  Muscle strength was normal.   Sensation was intact to light touch.   Radiology   Dg Shoulder Right  11/27/2013   CLINICAL DATA:  Right shoulder pain  EXAM: RIGHT SHOULDER - 2+ VIEW  COMPARISON:  None.  FINDINGS: There is no evidence of fracture or dislocation. There is no evidence of arthropathy or other focal bone abnormality. Soft tissues are unremarkable.  IMPRESSION: No acute osseous injury of the right shoulder.   Electronically Signed   By: Kathreen Devoid   On: 11/27/2013 11:51   Dg Ankle Complete Right  11/27/2013   CLINICAL DATA:  Injury stepping off a porch yesterday, stepped down onto RIGHT ankle wrong, fell landing on RIGHT shoulder, pain and swelling throughout ankle greater medially, initial encounter  EXAM: RIGHT ANKLE - COMPLETE 3+ VIEW  COMPARISON:  None  FINDINGS: Osseous mineralization normal.  Ankle mortise intact.  Diffuse soft tissue swelling greatest laterally.  Small plantar calcaneal spur.  Question tiny avulsion fragment at tip of lateral malleolus.  Additional tiny calcific densities are seen adjacent to the tip of the medial malleolus cannot exclude tiny avulsion fractures.  No additional fracture, dislocation or bone destruction.  IMPRESSION: Question tiny avulsion fractures at the tips of the medial and lateral malleoli.  Significant soft tissue swelling.   Electronically Signed   By: Lavonia Dana M.D.   On: 11/27/2013 11:52   I reviewed the images independently and personally and concur with the radiologist's findings.  Course  in [facility]   The following medications were given:  Medications  HYDROcodone-acetaminophen (NORCO/VICODIN) 5-325 MG per tablet 2 tablet (2 tablets Oral Given 11/27/13 1129)  HYDROmorphone (DILAUDID) injection 2 mg (2 mg Intramuscular Given 11/27/13 1258)  ondansetron (ZOFRAN-ODT) disintegrating tablet 8 mg (8 mg Oral Given 11/27/13 1258)    She is placed in a cam walker boot and given crutches.   Assessment   The primary encounter diagnosis was Pain.  Diagnoses of Fall, Bimalleolar fracture, right, closed, initial encounter, and Rotator cuff sprain, right, initial encounter were also pertinent to this visit.  No evidence of compartment syndrome. The fractures are minimal evulsion fractures. Will require surgical intervention. They will take about 6 weeks to heal up. Will need follow-up with orthopedics.  Plan     1.  Meds:  The following meds were prescribed:   New Prescriptions   HYDROCODONE-ACETAMINOPHEN (NORCO/VICODIN) 5-325 MG PER TABLET    1 to 2 tabs every 4 to 6 hours as needed for pain.    2.  Patient Education/Counseling:  The patient was given appropriate handouts, self care instructions, including rest and activity, elevation, application of ice and compression, and instructed in pain control.  Given exercises for the shoulder.  3.  Follow up:  The patient was told to follow up here if no better in 3 to 4 days, or sooner if becoming worse in any way, and given some red flag symptoms such as increasing pain or neurological symptoms which would prompt immediate return.  Follow up with Dr. Augustin Coupe within the next week. No weightbearing until then.     Harden Mo, MD 11/27/13 [phone]

## 2013-11-27 NOTE — ED Notes (Signed)
Pt  Reports    While    Going  Down  Some  Steps          She  Twisted     Her  r  Ankle         She  Has  Pain  And  Swelling        To  The affected area

## 2013-12-04 ENCOUNTER — Encounter (HOSPITAL_COMMUNITY): Payer: Self-pay | Admitting: Emergency Medicine

## 2013-12-05 DIAGNOSIS — M25511 Pain in right shoulder: Secondary | ICD-10-CM | POA: Insufficient documentation

## 2013-12-05 DIAGNOSIS — S93401A Sprain of unspecified ligament of right ankle, initial encounter: Secondary | ICD-10-CM | POA: Insufficient documentation

## 2014-03-29 DIAGNOSIS — S82891A Other fracture of right lower leg, initial encounter for closed fracture: Secondary | ICD-10-CM | POA: Insufficient documentation

## 2014-05-01 ENCOUNTER — Other Ambulatory Visit: Payer: Self-pay | Admitting: Gynecology

## 2014-05-28 ENCOUNTER — Encounter: Payer: Self-pay | Admitting: Gynecology

## 2014-07-16 ENCOUNTER — Other Ambulatory Visit (HOSPITAL_COMMUNITY)
Admission: RE | Admit: 2014-07-16 | Discharge: 2014-07-16 | Disposition: A | Discharge status: Home / Self Care | Payer: 59 | Source: Ambulatory Visit | Attending: Gynecology | Admitting: Gynecology

## 2014-07-16 ENCOUNTER — Encounter: Payer: Self-pay | Admitting: Gynecology

## 2014-07-16 ENCOUNTER — Ambulatory Visit (INDEPENDENT_AMBULATORY_CARE_PROVIDER_SITE_OTHER): Payer: 59 | Admitting: Gynecology

## 2014-07-16 VITALS — BP 122/78 | Ht 65.0 in | Wt 215.0 lb

## 2014-07-16 DIAGNOSIS — N9489 Other specified conditions associated with female genital organs and menstrual cycle: Secondary | ICD-10-CM | POA: Diagnosis not present

## 2014-07-16 DIAGNOSIS — Z01419 Encounter for gynecological examination (general) (routine) without abnormal findings: Secondary | ICD-10-CM | POA: Insufficient documentation

## 2014-07-16 DIAGNOSIS — N898 Other specified noninflammatory disorders of vagina: Secondary | ICD-10-CM

## 2014-07-16 DIAGNOSIS — N907 Vulvar cyst: Secondary | ICD-10-CM | POA: Diagnosis not present

## 2014-07-16 DIAGNOSIS — N951 Menopausal and female climacteric states: Secondary | ICD-10-CM | POA: Diagnosis not present

## 2014-07-16 LAB — WET PREP FOR TRICH, YEAST, CLUE
Trich, Wet Prep: NONE SEEN
WBC, Wet Prep HPF POC: NONE SEEN
Yeast Wet Prep HPF POC: NONE SEEN

## 2014-07-16 MED ORDER — ESTRADIOL 0.05 MG/24HR TD PTTW: 1.0000 | MEDICATED_PATCH | TRANSDERMAL | Status: DC

## 2014-07-16 MED ORDER — CLINDAMYCIN PHOSPHATE 2 % VA CREA: 1.0000 | TOPICAL_CREAM | Freq: Every day | VAGINAL | Status: DC

## 2014-07-16 NOTE — Addendum Note (Signed)
Addended by: Nelva Nay on: 07/16/2014 04:54 PM   Modules accepted: Orders

## 2014-07-16 NOTE — Patient Instructions (Signed)
Use the vaginal cream nightly for 7 nights. Follow up if your symptoms persist or worsen. Start on the estrogen patches twice weekly. Call me if you have any issues with this.  You may obtain a copy of any labs that were done today by logging onto MyChart as outlined in the instructions provided with your AVS (after visit summary). The office will not call with normal lab results but certainly if there are any significant abnormalities then we will contact you.   Health Maintenance, Female A healthy lifestyle and preventative care can promote health and wellness.  Maintain regular health, dental, and eye exams.  Eat a healthy diet. Foods like vegetables, fruits, whole grains, low-fat dairy products, and lean protein foods contain the nutrients you need without too many calories. Decrease your intake of foods high in solid fats, added sugars, and salt. Get information about a proper diet from your caregiver, if necessary.  Regular physical exercise is one of the most important things you can do for your health. Most adults should get at least 150 minutes of moderate-intensity exercise (any activity that increases your heart rate and causes you to sweat) each week. In addition, most adults need muscle-strengthening exercises on 2 or more days a week.   Maintain a healthy weight. The body mass index (BMI) is a screening tool to identify possible weight problems. It provides an estimate of body fat based on height and weight. Your caregiver can help determine your BMI, and can help you achieve or maintain a healthy weight. For adults 20 years and older:  A BMI below 18.5 is considered underweight.  A BMI of 18.5 to 24.9 is normal.  A BMI of 25 to 29.9 is considered overweight.  A BMI of 30 and above is considered obese.  Maintain normal blood lipids and cholesterol by exercising and minimizing your intake of saturated fat. Eat a balanced diet with plenty of fruits and vegetables. Blood tests for  lipids and cholesterol should begin at age 52 and be repeated every 5 years. If your lipid or cholesterol levels are high, you are over 50, or you are a high risk for heart disease, you may need your cholesterol levels checked more frequently.Ongoing high lipid and cholesterol levels should be treated with medicines if diet and exercise are not effective.  If you smoke, find out from your caregiver how to quit. If you do not use tobacco, do not start.  Lung cancer screening is recommended for adults aged 69 80 years who are at high risk for developing lung cancer because of a history of smoking. Yearly low-dose computed tomography (CT) is recommended for people who have at least a 30-pack-year history of smoking and are a current smoker or have quit within the past 15 years. A pack year of smoking is smoking an average of 1 pack of cigarettes a day for 1 year (for example: 1 pack a day for 30 years or 2 packs a day for 15 years). Yearly screening should continue until the smoker has stopped smoking for at least 15 years. Yearly screening should also be stopped for people who develop a health problem that would prevent them from having lung cancer treatment.  If you are pregnant, do not drink alcohol. If you are breastfeeding, be very cautious about drinking alcohol. If you are not pregnant and choose to drink alcohol, do not exceed 1 drink per day. One drink is considered to be 12 ounces (355 mL) of beer, 5 ounces (148  mL) of wine, or 1.5 ounces (44 mL) of liquor.  Avoid use of street drugs. Do not share needles with anyone. Ask for help if you need support or instructions about stopping the use of drugs.  High blood pressure causes heart disease and increases the risk of stroke. Blood pressure should be checked at least every 1 to 2 years. Ongoing high blood pressure should be treated with medicines, if weight loss and exercise are not effective.  If you are 10 to 53 years old, ask your caregiver if  you should take aspirin to prevent strokes.  Diabetes screening involves taking a blood sample to check your fasting blood sugar level. This should be done once every 3 years, after age 56, if you are within normal weight and without risk factors for diabetes. Testing should be considered at a younger age or be carried out more frequently if you are overweight and have at least 1 risk factor for diabetes.  Breast cancer screening is essential preventative care for women. You should practice "breast self-awareness." This means understanding the normal appearance and feel of your breasts and may include breast self-examination. Any changes detected, no matter how small, should be reported to a caregiver. Women in their 24s and 30s should have a clinical breast exam (CBE) by a caregiver as part of a regular health exam every 1 to 3 years. After age 52, women should have a CBE every year. Starting at age 90, women should consider having a mammogram (breast X-ray) every year. Women who have a family history of breast cancer should talk to their caregiver about genetic screening. Women at a high risk of breast cancer should talk to their caregiver about having an MRI and a mammogram every year.  Breast cancer gene (BRCA)-related cancer risk assessment is recommended for women who have family members with BRCA-related cancers. BRCA-related cancers include breast, ovarian, tubal, and peritoneal cancers. Having family members with these cancers may be associated with an increased risk for harmful changes (mutations) in the breast cancer genes BRCA1 and BRCA2. Results of the assessment will determine the need for genetic counseling and BRCA1 and BRCA2 testing.  The Pap test is a screening test for cervical cancer. Women should have a Pap test starting at age 33. Between ages 82 and 49, Pap tests should be repeated every 2 years. Beginning at age 17, you should have a Pap test every 3 years as long as the past 3 Pap  tests have been normal. If you had a hysterectomy for a problem that was not cancer or a condition that could lead to cancer, then you no longer need Pap tests. If you are between ages 61 and 77, and you have had normal Pap tests going back 10 years, you no longer need Pap tests. If you have had past treatment for cervical cancer or a condition that could lead to cancer, you need Pap tests and screening for cancer for at least 20 years after your treatment. If Pap tests have been discontinued, risk factors (such as a new sexual partner) need to be reassessed to determine if screening should be resumed. Some women have medical problems that increase the chance of getting cervical cancer. In these cases, your caregiver may recommend more frequent screening and Pap tests.  The human papillomavirus (HPV) test is an additional test that may be used for cervical cancer screening. The HPV test looks for the virus that can cause the cell changes on the cervix. The cells  collected during the Pap test can be tested for HPV. The HPV test could be used to screen women aged 13 years and older, and should be used in women of any age who have unclear Pap test results. After the age of 52, women should have HPV testing at the same frequency as a Pap test.  Colorectal cancer can be detected and often prevented. Most routine colorectal cancer screening begins at the age of 76 and continues through age 98. However, your caregiver may recommend screening at an earlier age if you have risk factors for colon cancer. On a yearly basis, your caregiver may provide home test kits to check for hidden blood in the stool. Use of a small camera at the end of a tube, to directly examine the colon (sigmoidoscopy or colonoscopy), can detect the earliest forms of colorectal cancer. Talk to your caregiver about this at age 54, when routine screening begins. Direct examination of the colon should be repeated every 5 to 10 years through age 80,  unless early forms of pre-cancerous polyps or small growths are found.  Hepatitis C blood testing is recommended for all people born from [birth year] through 1965 and any individual with known risks for hepatitis C.  Practice safe sex. Use condoms and avoid high-risk sexual practices to reduce the spread of sexually transmitted infections (STIs). Sexually active women aged 65 and younger should be checked for Chlamydia, which is a common sexually transmitted infection. Older women with new or multiple partners should also be tested for Chlamydia. Testing for other STIs is recommended if you are sexually active and at increased risk.  Osteoporosis is a disease in which the bones lose minerals and strength with aging. This can result in serious bone fractures. The risk of osteoporosis can be identified using a bone density scan. Women ages 27 and over and women at risk for fractures or osteoporosis should discuss screening with their caregivers. Ask your caregiver whether you should be taking a calcium supplement or vitamin D to reduce the rate of osteoporosis.  Menopause can be associated with physical symptoms and risks. Hormone replacement therapy is available to decrease symptoms and risks. You should talk to your caregiver about whether hormone replacement therapy is right for you.  Use sunscreen. Apply sunscreen liberally and repeatedly throughout the day. You should seek shade when your shadow is shorter than you. Protect yourself by wearing long sleeves, pants, a wide-brimmed hat, and sunglasses year round, whenever you are outdoors.  Notify your caregiver of new moles or changes in moles, especially if there is a change in shape or color. Also notify your caregiver if a mole is larger than the size of a pencil eraser.  Stay current with your immunizations. Document Released: 08/04/2010 Document Revised: 05/16/2012 Document Reviewed: 08/04/2010 [facility] Patient Information 2014 [place].

## 2014-07-16 NOTE — Progress Notes (Signed)
Deborah Jennings 13-Aug-1960 [id number]        53 y.o.  G2P1011 for annual exam.  Has not been in since 2013. Several issues noted below.  Past medical history,surgical history, problem list, medications, allergies, family history and social history were all reviewed and documented as reviewed in the EPIC chart.  ROS:  Performed with pertinent positives and negatives included in the history, assessment and plan.   Additional significant findings :  none   Exam: Kim Counsellor Vitals:   07/16/14 1612  BP: 122/78  Height: 5\' 5"  (1.651 m)  Weight: 215 lb (97.523 kg)   General appearance:  Normal affect, orientation and appearance. Skin: Grossly normal HEENT: Without gross lesions.  No cervical or supraclavicular adenopathy. Thyroid normal.  Lungs:  Clear without wheezing, rales or rhonchi Cardiac: RR, without RMG Abdominal:  Soft, nontender, without masses, guarding, rebound, organomegaly or hernia Breasts:  Examined lying and sitting without masses, retractions, discharge or axillary adenopathy. Pelvic:  Ext/BUS/vagina with slight white discharge. Numerous benign appearing small sebaceous cysts over both labia majora  Adnexa  Without masses or tenderness    Anus and perineum  Normal   Rectovaginal  Normal sphincter tone without palpated masses or tenderness.    Assessment/Plan:  54 y.o. G80P1011 female for annual exam.   1. Postmenopausal/menopausal symptoms. Status post TAH RSO in the past for endometriosis. Had been on Vivelle 0.05 mg patches but ran out. Now having significant hot flashes, night sweats, emotional swings and vaginal dryness. Options for management reviewed to include reinitiation of her ERT. Risks/benefits to include increased risk of thrombosis such as stroke heart attack DVT as well as possible breast cancer risks. Patient understands and accepts and wants to start back and I really ordered her Vivelle-Dot 0.05 mg patches 1 year. Will call me if she does not get  acceptable results after 1-2 months. 2. Vaginal odor. Patient notes vaginal odor on and off. No significant discharge itching or irritation. Wet prep is positive for bacterial vaginosis. Treat with Cleocin vaginal cream nightly 7 nights at her request. Follow up if symptoms persist, worsen or recur. 3. Sebaceous cysts bilateral labia majora. Present and stable for years. Continue to monitor and report any changes. 4. Pap smear 2011. Pap smear of vaginal cuff today. No history of significant abnormal Pap smears. Options to stop screening and she is status post hysterectomy for benign indications versus less frequent screening intervals reviewed. Will readdress on annual basis. 5. Mammography 2013 and overdue. Patient knows to schedule now agrees to do so. SBE monthly reviewed. 6. Colostomy 2013. Repeat at their recommended interval. 7. DEXA never. Will plan further into the menopause. Increase calcium vitamin D reviewed. 8. Health maintenance. No routine blood work done as patient reports this done at her primary physician's office. Follow up in one year, sooner if any issues with her HRT     Anastasio Auerbach MD, 4:44 PM 07/16/2014

## 2014-07-17 LAB — URINALYSIS W MICROSCOPIC + REFLEX CULTURE
Bilirubin Urine: NEGATIVE
Casts: NONE SEEN
Crystals: NONE SEEN
Glucose, UA: NEGATIVE mg/dL
Hgb urine dipstick: NEGATIVE
Ketones, ur: NEGATIVE mg/dL
Leukocytes, UA: NEGATIVE
Nitrite: NEGATIVE
Protein, ur: NEGATIVE mg/dL
Specific Gravity, Urine: 1.03 (ref 1.005–1.030)
Urobilinogen, UA: 0.2 mg/dL (ref 0.0–1.0)
pH: 5 (ref 5.0–8.0)

## 2014-07-18 LAB — URINE CULTURE
Colony Count: NO GROWTH
Organism ID, Bacteria: NO GROWTH

## 2014-07-18 LAB — CYTOLOGY - PAP

## 2015-09-10 ENCOUNTER — Encounter: Payer: Self-pay | Admitting: *Deleted

## 2016-04-14 ENCOUNTER — Encounter: Payer: Self-pay | Admitting: Gynecology

## 2016-04-21 ENCOUNTER — Ambulatory Visit (INDEPENDENT_AMBULATORY_CARE_PROVIDER_SITE_OTHER): Payer: Commercial Managed Care - HMO | Admitting: Gynecology

## 2016-04-21 ENCOUNTER — Encounter: Payer: Self-pay | Admitting: Gynecology

## 2016-04-21 VITALS — BP 132/76 | Ht 64.5 in | Wt 210.0 lb

## 2016-04-21 DIAGNOSIS — B9689 Other specified bacterial agents as the cause of diseases classified elsewhere: Secondary | ICD-10-CM

## 2016-04-21 DIAGNOSIS — N898 Other specified noninflammatory disorders of vagina: Secondary | ICD-10-CM | POA: Diagnosis not present

## 2016-04-21 DIAGNOSIS — N952 Postmenopausal atrophic vaginitis: Secondary | ICD-10-CM

## 2016-04-21 DIAGNOSIS — Z01411 Encounter for gynecological examination (general) (routine) with abnormal findings: Secondary | ICD-10-CM

## 2016-04-21 DIAGNOSIS — Z13220 Encounter for screening for lipoid disorders: Secondary | ICD-10-CM | POA: Diagnosis not present

## 2016-04-21 DIAGNOSIS — N760 Acute vaginitis: Secondary | ICD-10-CM | POA: Diagnosis not present

## 2016-04-21 DIAGNOSIS — L723 Sebaceous cyst: Secondary | ICD-10-CM

## 2016-04-21 DIAGNOSIS — N941 Unspecified dyspareunia: Secondary | ICD-10-CM | POA: Diagnosis not present

## 2016-04-21 DIAGNOSIS — N951 Menopausal and female climacteric states: Secondary | ICD-10-CM | POA: Diagnosis not present

## 2016-04-21 LAB — WET PREP FOR TRICH, YEAST, CLUE
Trich, Wet Prep: NONE SEEN
Yeast Wet Prep HPF POC: NONE SEEN

## 2016-04-21 MED ORDER — CLINDAMYCIN PHOSPHATE 2 % VA CREA: 1.0000 | TOPICAL_CREAM | Freq: Every day | VAGINAL | 0 refills | Status: DC

## 2016-04-21 MED ORDER — ESTRADIOL 0.05 MG/24HR TD PTTW: 1.0000 | MEDICATED_PATCH | TRANSDERMAL | 12 refills | Status: DC

## 2016-04-21 NOTE — Progress Notes (Signed)
Deborah Jennings Sep 08, 1960 [id number]        56 y.o.  G2P1011 for annual exam.  Also complaining of vaginal odor with irritation. Previously treated for bacterial vaginosis 2016 with Cleocin vaginal cream with resolution of her symptoms but they now have returned. Had previously been on ERT but discontinued last year. Notes worsening hot flushes and night sweats. Also having daily vaginal dryness and dyspareunia despite use of the lubricants now becoming very uncomfortable for her. Status post TAH RSO in the past for endometriosis.  Past medical history,surgical history, problem list, medications, allergies, family history and social history were all reviewed and documented as reviewed in the EPIC chart.  ROS:  Performed with pertinent positives and negatives included in the history, assessment and plan.   Additional significant findings :  None   Exam: Copywriter, advertising Vitals:   04/21/16 1108  BP: 132/76  Weight: 210 lb (95.3 kg)  Height: 5' 4.5" (1.638 m)   Body mass index is 35.49 kg/m.  General appearance:  Normal affect, orientation and appearance. Skin: Grossly normal HEENT: Without gross lesions.  No cervical or supraclavicular adenopathy. Thyroid normal.  Lungs:  Clear without wheezing, rales or rhonchi Cardiac: RR, without RMG Abdominal:  Soft, nontender, without masses, guarding, rebound, organomegaly. Small finger size umbilical hernia easily reducible noted Breasts:  Examined lying and sitting without masses, retractions, discharge or axillary adenopathy. Pelvic:  Ext, BUS, Vagina: With atrophic changes. Multiple small classic sebaceous cysts involving both labia majora  Adnexa: Without masses or tenderness    Anus and perineum: Normal   Rectovaginal: Normal sphincter tone without palpated masses or tenderness.    Assessment/Plan:  56 y.o. G53P1011 female for annual exam.   1. Menopausal symptoms/vaginal atrophy symptoms of vaginal dryness daily with worsening  dyspareunia. I reviewed options with her to include different lubricants as well as OTC products. I reviewed vaginal estrogen alone options up to including ERT. Various deliveries to include oral versus transdermal discussed. Benefits of transdermal from a decreased thrombosis risk reviewed. Risks to include stroke heart attack DVT and the breast cancer issue discussed. After a lengthy discussion she wants to start on estradiol 0.5 mg patches. We'll start twice weekly with refill 1 year. She'll call me if she does not have adequate relief of her symptoms. 2. Vaginal odor with irritation. Wet prep consistent with bacterial vaginosis. Options for treatment to include oral versus vaginal discussed and she prefers Cleocin vaginal cream 7 nights. Follow up if symptoms persist, worsen or recur. 3. Vulvar sebaceous cysts. Present for years not bothersome to the patient. Continue to follow and report any issues. 4. Easily reducible umbilical hernia present for years. Signs and symptoms of incarceration discussed. 5. Mammography 2013. The common cancer in women reviewed. Need to schedule now stressed and patient agrees to do so. SBE monthly reviewed. 6. Pap smear 2016. No Pap smear done today. No history of significant abnormal Pap smears. Options to stop screening altogether per current screening guidelines and hysterectomy history discussed. Will readdress on annual basis. 7. Colonoscopy 2013. Repeat at their recommended interval. 8. DEXA never. Will check baseline D level today. Plan DEXA further into the menopause. 9. Health maintenance. Patient requests baseline labs. CBC, CMP, lipid profile, TSH, vitamin D, urinalysis ordered. Follow up in one year, sooner as needed.  Additional time in excess of her routine gynecologic exam was spent in direct face to face counseling and coordination of care in regards to her menopausal symptoms and atrophic vaginitis  symptoms with discussion of treatment options and  ultimately prescription provided. Also her bacterial vaginitis evaluation and treatment.    Anastasio Auerbach MD, 11:34 AM 04/21/2016

## 2016-04-21 NOTE — Patient Instructions (Signed)
Use the vaginal cream nightly for 7 nights. Follow up if the vaginal issues continue.  Start back on the estrogen patches twice weekly. Call for any issues with this.  Schedule your mammogram.

## 2016-04-22 ENCOUNTER — Other Ambulatory Visit: Payer: Commercial Managed Care - HMO

## 2016-04-22 LAB — URINALYSIS W MICROSCOPIC + REFLEX CULTURE
Bacteria, UA: NONE SEEN [HPF]
Bilirubin Urine: NEGATIVE
Casts: NONE SEEN [LPF]
Crystals: NONE SEEN [HPF]
Glucose, UA: NEGATIVE
Hgb urine dipstick: NEGATIVE
Ketones, ur: NEGATIVE
Leukocytes, UA: NEGATIVE
Nitrite: NEGATIVE
Protein, ur: NEGATIVE
Specific Gravity, Urine: 1.019 (ref 1.001–1.035)
WBC, UA: NONE SEEN WBC/HPF (ref <=5)
Yeast: NONE SEEN [HPF]
pH: 7 (ref 5.0–8.0)

## 2016-04-23 ENCOUNTER — Other Ambulatory Visit: Payer: Commercial Managed Care - HMO

## 2016-04-23 LAB — COMPREHENSIVE METABOLIC PANEL
ALT: 26 U/L (ref 6–29)
AST: 25 U/L (ref 10–35)
Albumin: 4.1 g/dL (ref 3.6–5.1)
Alkaline Phosphatase: 67 U/L (ref 33–130)
BUN: 18 mg/dL (ref 7–25)
CO2: 26 mmol/L (ref 20–31)
Calcium: 8.9 mg/dL (ref 8.6–10.4)
Chloride: 108 mmol/L (ref 98–110)
Creat: 0.65 mg/dL (ref 0.50–1.05)
Glucose, Bld: 104 mg/dL — ABNORMAL HIGH (ref 65–99)
Potassium: 4.7 mmol/L (ref 3.5–5.3)
Sodium: 142 mmol/L (ref 135–146)
Total Bilirubin: 0.4 mg/dL (ref 0.2–1.2)
Total Protein: 6.4 g/dL (ref 6.1–8.1)

## 2016-04-23 LAB — VITAMIN D 25 HYDROXY (VIT D DEFICIENCY, FRACTURES): Vit D, 25-Hydroxy: 31 ng/mL (ref 30–100)

## 2016-04-23 LAB — CBC WITH DIFFERENTIAL/PLATELET
Basophils Absolute: 0 {cells}/uL (ref 0–200)
Basophils Relative: 0 %
Eosinophils Absolute: 178 {cells}/uL (ref 15–500)
Eosinophils Relative: 2 %
HCT: 41.8 % (ref 35.0–45.0)
Hemoglobin: 14.2 g/dL (ref 11.7–15.5)
Lymphocytes Relative: 35 %
Lymphs Abs: 3115 {cells}/uL (ref 850–3900)
MCH: 31.1 pg (ref 27.0–33.0)
MCHC: 34 g/dL (ref 32.0–36.0)
MCV: 91.5 fL (ref 80.0–100.0)
MPV: 11.7 fL (ref 7.5–12.5)
Monocytes Absolute: 712 {cells}/uL (ref 200–950)
Monocytes Relative: 8 %
Neutro Abs: 4895 {cells}/uL (ref 1500–7800)
Neutrophils Relative %: 55 %
Platelets: 216 K/uL (ref 140–400)
RBC: 4.57 MIL/uL (ref 3.80–5.10)
RDW: 14.4 % (ref 11.0–15.0)
WBC: 8.9 K/uL (ref 3.8–10.8)

## 2016-04-23 LAB — LIPID PANEL
Cholesterol: 241 mg/dL — ABNORMAL HIGH
HDL: 77 mg/dL
LDL Cholesterol: 119 mg/dL — ABNORMAL HIGH
Total CHOL/HDL Ratio: 3.1 ratio
Triglycerides: 223 mg/dL — ABNORMAL HIGH
VLDL: 45 mg/dL — ABNORMAL HIGH

## 2016-04-23 LAB — URINE CULTURE

## 2016-04-23 LAB — TSH: TSH: 1.98 mIU/L

## 2016-04-24 ENCOUNTER — Other Ambulatory Visit: Payer: Self-pay | Admitting: *Deleted

## 2016-04-24 DIAGNOSIS — E782 Mixed hyperlipidemia: Secondary | ICD-10-CM

## 2016-04-24 DIAGNOSIS — R7309 Other abnormal glucose: Secondary | ICD-10-CM

## 2016-05-05 ENCOUNTER — Other Ambulatory Visit: Payer: Commercial Managed Care - HMO

## 2016-08-28 ENCOUNTER — Ambulatory Visit (INDEPENDENT_AMBULATORY_CARE_PROVIDER_SITE_OTHER): Payer: 59 | Admitting: Family Medicine

## 2016-08-28 ENCOUNTER — Encounter: Payer: Self-pay | Admitting: Family Medicine

## 2016-08-28 VITALS — BP 136/90 | HR 84 | Temp 98.7°F | Resp 18 | Ht 65.0 in | Wt 208.0 lb

## 2016-08-28 DIAGNOSIS — Z1231 Encounter for screening mammogram for malignant neoplasm of breast: Secondary | ICD-10-CM

## 2016-08-28 DIAGNOSIS — Z1239 Encounter for other screening for malignant neoplasm of breast: Secondary | ICD-10-CM

## 2016-08-28 DIAGNOSIS — H66002 Acute suppurative otitis media without spontaneous rupture of ear drum, left ear: Secondary | ICD-10-CM

## 2016-08-28 DIAGNOSIS — F418 Other specified anxiety disorders: Secondary | ICD-10-CM

## 2016-08-28 DIAGNOSIS — Z0000 Encounter for general adult medical examination without abnormal findings: Secondary | ICD-10-CM | POA: Diagnosis not present

## 2016-08-28 MED ORDER — AZITHROMYCIN 250 MG PO TABS
ORAL_TABLET | ORAL | 0 refills | Status: DC
Start: 2016-08-28 — End: 2017-03-12

## 2016-08-28 MED ORDER — ALPRAZOLAM 1 MG PO TABS: 1.0000 mg | ORAL_TABLET | Freq: Three times a day (TID) | ORAL | 1 refills | Status: DC | PRN
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
## 2016-09-01 ENCOUNTER — Other Ambulatory Visit: Payer: 59

## 2016-09-02 ENCOUNTER — Telehealth: Payer: Self-pay | Admitting: Family Medicine

## 2016-09-02 MED ORDER — CEFDINIR 300 MG PO CAPS: 300.0000 mg | ORAL_CAPSULE | Freq: Two times a day (BID) | ORAL | 0 refills | Status: DC

## 2016-09-02 NOTE — Telephone Encounter (Signed)
Called and spoke to Spencer, pt's mother, and informed her that we were sending in different antibx. She will inform pt.

## 2016-09-02 NOTE — Telephone Encounter (Signed)
Pt is still sick from last visit, dr. Dennard Schaumann told her to call if she was not better so we could call in something else.

## 2016-09-04 ENCOUNTER — Other Ambulatory Visit: Payer: 59

## 2016-10-12 ENCOUNTER — Encounter: Payer: Self-pay | Admitting: Family Medicine

## 2016-10-21 LAB — HM MAMMOGRAPHY

## 2016-10-28 ENCOUNTER — Encounter: Payer: Self-pay | Admitting: Family Medicine

## 2017-02-16 ENCOUNTER — Other Ambulatory Visit: Payer: Self-pay | Admitting: Family Medicine

## 2017-02-16 NOTE — Telephone Encounter (Signed)
Requesting refill    Xanax  LOV: 08/28/16  LRF:   08/28/16

## 2017-03-12 ENCOUNTER — Ambulatory Visit (INDEPENDENT_AMBULATORY_CARE_PROVIDER_SITE_OTHER): Payer: Managed Care, Other (non HMO) | Admitting: Family Medicine

## 2017-03-12 ENCOUNTER — Encounter: Payer: Self-pay | Admitting: Family Medicine

## 2017-03-12 VITALS — BP 150/100 | HR 88 | Temp 98.8°F | Resp 20 | Ht 65.0 in | Wt 206.0 lb

## 2017-03-12 DIAGNOSIS — J209 Acute bronchitis, unspecified: Secondary | ICD-10-CM | POA: Diagnosis not present

## 2017-03-12 DIAGNOSIS — M67471 Ganglion, right ankle and foot: Secondary | ICD-10-CM | POA: Diagnosis not present

## 2017-03-12 MED ORDER — AZITHROMYCIN 250 MG PO TABS: ORAL_TABLET | ORAL | 0 refills | Status: DC

## 2017-03-12 MED ORDER — ALBUTEROL SULFATE HFA 108 (90 BASE) MCG/ACT IN AERS: 2.0000 | INHALATION_SPRAY | Freq: Four times a day (QID) | RESPIRATORY_TRACT | 0 refills | Status: DC | PRN

## 2017-03-12 MED ORDER — HYDROCODONE-HOMATROPINE 5-1.5 MG/5ML PO SYRP
5.0000 mL | ORAL_SOLUTION | Freq: Three times a day (TID) | ORAL | 0 refills | Status: DC | PRN
Start: 2017-03-12 — End: 2017-04-22

## 2017-03-12 MED ORDER — PREDNISONE 20 MG PO TABS: ORAL_TABLET | ORAL | 0 refills | Status: DC

## 2017-03-12 NOTE — Progress Notes (Signed)
Subjective:    Patient ID: Deborah Jennings, female    DOB: May 05, 1960, 57 y.o.   MRN: [id number]  HPI  Patient developed a cold 1 week ago.  Symptoms gradually improved over last weekend.  However Monday symptoms drastically worsened suddenly.  She reports audible wheezing, increasing chest congestion, cough productive of yellow sputum, she reports bilateral pleurisy.  On exam she has diminished breath sounds bilaterally with expiratory wheezing.  She also has rhonchorous breath sounds bilaterally.  She also reports a lesion growing on the dorsum of her right foot.  It is over the proximal portion of the fifth metatarsal.  It is a soft well-circumscribed fluid-filled cyst similar to a ganglion cyst however is extremely tender to touch.  She states that it used to be small but is recently begun getting larger. Past Medical History:  Diagnosis Date  . Asthma   . Endometriosis   . IBS (irritable bowel syndrome)   . Migraine    Past Surgical History:  Procedure Laterality Date  . ABDOMINAL HYSTERECTOMY     RSO  . CESAREAN SECTION    . DILATION AND CURETTAGE OF UTERUS    . lower back surgery     "[place]"  . OOPHORECTOMY     RSO  . PELVIC LAPAROSCOPY     Lysis of adhesions-Endometriosis   Current Outpatient Medications on File Prior to Visit  Medication Sig Dispense Refill  . ALPRAZolam (XANAX) 1 MG tablet TAKE 1 TABLET BY MOUTH 3 TIMES A DAY AS NEEDED FOR ANXIETY 30 tablet 1  . estradiol (VIVELLE-DOT) 0.05 MG/24HR patch Place 1 patch (0.05 mg total) onto the skin 2 (two) times a week. 8 patch 12  . Multiple Vitamin (MULTIVITAMIN) tablet Take 1 tablet by mouth daily.     No current facility-administered medications on file prior to visit.    Allergies  Allergen Reactions  . Ampicillin Swelling   Social History   Socioeconomic History  . Marital status: Married    Spouse name: Not on file  . Number of children: Not on file  . Years of education: Not on file  .  Highest education level: Not on file  Social Needs  . Financial resource strain: Not on file  . Food insecurity - worry: Not on file  . Food insecurity - inability: Not on file  . Transportation needs - medical: Not on file  . Transportation needs - non-medical: Not on file  Occupational History  . Not on file  Tobacco Use  . Smoking status: Former Research scientist (life sciences)  . Smokeless tobacco: Never Used  Substance and Sexual Activity  . Alcohol use: Yes    Alcohol/week: 3.0 oz    Types: 5 Standard drinks or equivalent per week  . Drug use: No  . Sexual activity: Not Currently    Birth control/protection: Surgical    Comment: 1st intercourse 57 yo-More than 5 partners  Other Topics Concern  . Not on file  Social History Narrative  . Not on file     Review of Systems  All other systems reviewed and are negative.      Objective:   Physical Exam  HENT:  Right Ear: External ear normal.  Left Ear: External ear normal.  Nose: Nose normal.  Mouth/Throat: Oropharynx is clear and moist. No oropharyngeal exudate.  Eyes: Conjunctivae are normal.  Neck: Neck supple.  Cardiovascular: Normal rate, regular rhythm and normal heart sounds.  Pulmonary/Chest: No respiratory distress. She has wheezes.  Abdominal:  Soft. Bowel sounds are normal.  Musculoskeletal:       Right foot: There is tenderness, bony tenderness and deformity.       Feet:  Lymphadenopathy:    She has no cervical adenopathy.  Vitals reviewed.         Assessment & Plan:  Acute bronchitis, unspecified organism - Plan: albuterol (PROVENTIL HFA;VENTOLIN HFA) 108 (90 Base) MCG/ACT inhaler, azithromycin (ZITHROMAX) 250 MG tablet, predniSONE (DELTASONE) 20 MG tablet  Ganglion cyst of right foot - Plan: Ambulatory referral to Podiatry  Patient has acute bronchitis with bronchospasm.  Begin a Z-Pak for the bronchitis and treat the bronchospasm with prednisone taper pack in addition to albuterol 2 puffs inhaled every 4-6 hours as  needed for wheezing.  She also has a ganglion cyst on the dorsum of her right foot.  I will consult podiatrist for surgical excision.  This is also the second event where her blood pressure was elevated.  Patient denies that she has high blood pressure and would like to check it at home and report advised to me in 1 week.  She believes this numbers artificially elevated because she feels poorly due to her illness.

## 2017-03-19 ENCOUNTER — Telehealth: Payer: Self-pay | Admitting: Family Medicine

## 2017-03-19 NOTE — Telephone Encounter (Signed)
Pt needs refill on levbid or levson sent to [facility] [address].

## 2017-03-22 ENCOUNTER — Other Ambulatory Visit: Payer: Self-pay | Admitting: Family Medicine

## 2017-03-22 MED ORDER — HYOSCYAMINE SULFATE ER 0.375 MG PO TB12: 0.3750 mg | ORAL_TABLET | Freq: Two times a day (BID) | ORAL | 5 refills | Status: DC

## 2017-03-22 NOTE — Telephone Encounter (Signed)
Called pt and she states that she has taken levbid in the past for her IBS and it works better for her, she is having a flare. OK to send to pharm. Need sig please as we have never rx'd that for her.

## 2017-03-22 NOTE — Telephone Encounter (Signed)
[facility] 

## 2017-03-29 ENCOUNTER — Encounter: Payer: Managed Care, Other (non HMO) | Admitting: Podiatry

## 2017-03-29 ENCOUNTER — Ambulatory Visit (INDEPENDENT_AMBULATORY_CARE_PROVIDER_SITE_OTHER): Payer: Managed Care, Other (non HMO)

## 2017-03-29 DIAGNOSIS — M6740 Ganglion, unspecified site: Secondary | ICD-10-CM | POA: Diagnosis not present

## 2017-04-01 NOTE — Progress Notes (Signed)
Patient cancelled

## 2017-04-02 DIAGNOSIS — R87610 Atypical squamous cells of undetermined significance on cytologic smear of cervix (ASC-US): Secondary | ICD-10-CM

## 2017-04-02 HISTORY — DX: Atypical squamous cells of undetermined significance on cytologic smear of cervix (ASC-US): R87.610

## 2017-04-06 ENCOUNTER — Encounter: Payer: Self-pay | Admitting: Podiatry

## 2017-04-06 ENCOUNTER — Ambulatory Visit (INDEPENDENT_AMBULATORY_CARE_PROVIDER_SITE_OTHER): Payer: Managed Care, Other (non HMO)

## 2017-04-06 ENCOUNTER — Ambulatory Visit: Payer: Managed Care, Other (non HMO) | Admitting: Podiatry

## 2017-04-06 DIAGNOSIS — M21619 Bunion of unspecified foot: Secondary | ICD-10-CM

## 2017-04-06 DIAGNOSIS — M6740 Ganglion, unspecified site: Secondary | ICD-10-CM | POA: Diagnosis not present

## 2017-04-06 NOTE — Patient Instructions (Signed)
Bunion A bunion is a bump on the base of the big toe that forms when the bones of the big toe joint move out of position. Bunions may be small at first, but they often get larger over time. The can make walking painful. What are the causes? A bunion may be caused by:  Wearing narrow or pointed shoes that force the big toe to press against the other toes.  Abnormal foot development that causes the foot to roll inward (pronate).  Changes in the foot that are caused by certain diseases, such as rheumatoid arthritis and polio.  A foot injury.  What increases the risk? The following factors may make you more likely to develop this condition:  Wearing shoes that squeeze the toes together.  Having certain diseases, such as: ? Rheumatoid arthritis. ? Polio. ? Cerebral palsy.  Having family members who have bunions.  Being born with a foot deformity, such as flat feet or low arches.  Doing activities that put a lot of pressure on the feet, such as ballet dancing.  What are the signs or symptoms? The main symptom of a bunion is a noticeable bump on the big toe. Other symptoms may include:  Pain.  Swelling around the big toe.  Redness and inflammation.  Thick or hardened skin on the big toe or between the toes.  Stiffness or loss of motion in the big toe.  Trouble with walking.  How is this diagnosed? A bunion may be diagnosed based on your symptoms, medical history, and activities. You may have tests, such as:  X-rays. These allow your health care provider to check the position of the bones in your foot and look for damage to your joint. They also help your health care provider to determine the severity of your bunion and the best way to treat it.  Joint aspiration. In this test, a sample of fluid is removed from the toe joint. This test, which may be done if you are in a lot of pain, helps to rule out diseases that cause painful swelling of the joints, such as  arthritis.  How is this treated? There is no cure for a bunion, but treatment can help to prevent a bunion from getting worse. Treatment depends on the severity of your symptoms. Your health care provider may recommend:  Wearing shoes that have a wide toe box.  Using bunion pads to cushion the affected area.  Taping your toes together to keep them in a normal position.  Placing a device inside your shoe (orthotics) to help reduce pressure on your toe joint.  Taking medicine to ease pain, inflammation, and swelling.  Applying heat or ice to the affected area.  Doing stretching exercises.  Surgery to remove scar tissue and move the toes back into their normal position. This treatment is rare.  Follow these instructions at home:  Support your toe joint with proper footwear, shoe padding, or taping as told by your health care provider.  Take over-the-counter and prescription medicines only as told by your health care provider.  If directed, apply ice to the injured area: ? Put ice in a plastic bag. ? Place a towel between your skin and the bag. ? Leave the ice on for 20 minutes, 2-3 times per day.  If directed, apply heat to the affected area before you exercise. Use the heat source that your health care provider recommends, such as a moist heat pack or a heating pad. ? Place a towel between your   skin and the heat source. ? Leave the heat on for 20-30 minutes. ? Remove the heat if your skin turns bright red. This is especially important if you are unable to feel pain, heat, or cold. You may have a greater risk of getting burned.  Do exercises as told by your health care provider.  Keep all follow-up visits as told by your health care provider. Contact a health care provider if:  Your symptoms get worse.  Your symptoms do not improve in 2 weeks. Get help right away if:  You have severe pain and trouble with walking. This information is not intended to replace advice given  to you by your health care provider. Make sure you discuss any questions you have with your health care provider. Document Released: 01/19/2005 Document Revised: 06/27/2015 Document Reviewed: 08/19/2014 Elsevier Interactive Patient Education  2018 [employer]. Ganglion Cyst A ganglion cyst is a noncancerous, fluid-filled lump that occurs near joints or tendons. The ganglion cyst grows out of a joint or the lining of a tendon. It most often develops in the hand or wrist, but it can also develop in the shoulder, elbow, hip, knee, ankle, or foot. The round or oval ganglion cyst can be the size of a pea or larger than a grape. Increased activity may enlarge the size of the cyst because more fluid starts to build up. What are the causes? It is not known what causes a ganglion cyst to grow. However, it may be related to:  Inflammation or irritation around the joint.  An injury.  Repetitive movements or overuse.  Arthritis.  What increases the risk? Risk factors include:  Being a woman.  Being age 82-50.  What are the signs or symptoms? Symptoms may include:  A lump. This most often appears on the hand or wrist, but it can occur in other areas of the body.  Tingling.  Pain.  Numbness.  Muscle weakness.  Weak grip.  Less movement in a joint.  How is this diagnosed? Ganglion cysts are most often diagnosed based on a physical exam. Your health care provider will feel the lump and may shine a light alongside it. If it is a ganglion cyst, a light often shines through it. Your health care provider may order an X-ray, ultrasound, or MRI to rule out other conditions. How is this treated? Ganglion cysts usually go away on their own without treatment. If pain or other symptoms are involved, treatment may be needed. Treatment is also needed if the ganglion cyst limits your movement or if it gets infected. Treatment may include:  Wearing a brace or splint on your wrist or  finger.  Taking anti-inflammatory medicine.  Draining fluid from the lump with a needle (aspiration).  Injecting a steroid into the joint.  Surgery to remove the ganglion cyst.  Follow these instructions at home:  Do not press on the ganglion cyst, poke it with a needle, or hit it.  Take medicines only as directed by your health care provider.  Wear your brace or splint as directed by your health care provider.  Watch your ganglion cyst for any changes.  Keep all follow-up visits as directed by your health care provider. This is important. Contact a health care provider if:  Your ganglion cyst becomes larger or more painful.  You have increased redness, red streaks, or swelling.  You have pus coming from the lump.  You have weakness or numbness in the affected area.  You have a fever or  chills. This information is not intended to replace advice given to you by your health care provider. Make sure you discuss any questions you have with your health care provider. Document Released: 01/17/2000 Document Revised: 06/27/2015 Document Reviewed: 07/04/2013 Elsevier Interactive Patient Education  2018 [employer].

## 2017-04-06 NOTE — Progress Notes (Signed)
Subjective:   Patient ID: Deborah Jennings, female   DOB: 57 y.o.   MRN: [id number]   HPI Deborah Jennings presents the office today for concerns of a painful mass to the outside aspect of the right foot which is been ongoing for last several weeks.  She states that it feels that there is hitting the nerve on the area.  She states that it is tender to touch.  Denies any recent injury or trauma.  She also has bunions of both of her feet with the left side worse than the right.  She states that then she developed a mass in the right foot it puts more pressure to the shoes and causing the bunion to her.  She has had findings her entire life in the in general do not hurt when she wears the proper shoes.  No other treatment for this.  No other concerns today.  Review of Systems  All other systems reviewed and are negative.  Past Medical History:  Diagnosis Date  . Asthma   . Endometriosis   . IBS (irritable bowel syndrome)   . Migraine     Past Surgical History:  Procedure Laterality Date  . ABDOMINAL HYSTERECTOMY     RSO  . CESAREAN SECTION    . DILATION AND CURETTAGE OF UTERUS    . lower back surgery     "[place]"  . OOPHORECTOMY     RSO  . PELVIC LAPAROSCOPY     Lysis of adhesions-Endometriosis     Current Outpatient Medications:  .  albuterol (PROVENTIL HFA;VENTOLIN HFA) 108 (90 Base) MCG/ACT inhaler, Inhale 2 puffs into the lungs every 6 (six) hours as needed for wheezing or shortness of breath., Disp: 1 Inhaler, Rfl: 0 .  ALPRAZolam (XANAX) 1 MG tablet, TAKE 1 TABLET BY MOUTH 3 TIMES A DAY AS NEEDED FOR ANXIETY, Disp: 30 tablet, Rfl: 1 .  azithromycin (ZITHROMAX) 250 MG tablet, 2 tabs poqday1, 1 tab poqday 2-5, Disp: 6 tablet, Rfl: 0 .  estradiol (VIVELLE-DOT) 0.05 MG/24HR patch, Place 1 patch (0.05 mg total) onto the skin 2 (two) times a week., Disp: 8 patch, Rfl: 12 .  HYDROcodone-homatropine (HYCODAN) 5-1.5 MG/5ML syrup, Take 5 mLs by mouth every 8 (eight) hours as  needed for cough., Disp: 120 mL, Rfl: 0 .  hyoscyamine (LEVBID) 0.375 MG 12 hr tablet, Take 1 tablet (0.375 mg total) by mouth 2 (two) times daily., Disp: 60 tablet, Rfl: 5 .  Multiple Vitamin (MULTIVITAMIN) tablet, Take 1 tablet by mouth daily., Disp: , Rfl:  .  predniSONE (DELTASONE) 20 MG tablet, 3 tabs poqday 1-2, 2 tabs poqday 3-4, 1 tab poqday 5-6, Disp: 12 tablet, Rfl: 0  Allergies  Allergen Reactions  . Ampicillin Swelling    Social History   Socioeconomic History  . Marital status: Married    Spouse name: Not on file  . Number of children: Not on file  . Years of education: Not on file  . Highest education level: Not on file  Social Needs  . Financial resource strain: Not on file  . Food insecurity - worry: Not on file  . Food insecurity - inability: Not on file  . Transportation needs - medical: Not on file  . Transportation needs - non-medical: Not on file  Occupational History  . Not on file  Tobacco Use  . Smoking status: Former Research scientist (life sciences)  . Smokeless tobacco: Never Used  Substance and Sexual Activity  . Alcohol use: Yes  Alcohol/week: 3.0 oz    Types: 5 Standard drinks or equivalent per week  . Drug use: No  . Sexual activity: Not Currently    Birth control/protection: Surgical    Comment: 1st intercourse 57 yo-More than 5 partners  Other Topics Concern  . Not on file  Social History Narrative  . Not on file       Objective:  Physical Exam  General: AAO x3, NAD  Dermatological: Skin is warm, dry and supple bilateral. Nails x 10 are well manicured; remaining integument appears unremarkable at this time. There are no open sores, no preulcerative lesions, no rash or signs of infection present.  Vascular: Dorsalis Pedis artery and Posterior Tibial artery pedal pulses are 2/4 bilateral with immedate capillary fill time. Pedal hair growth present. No varicosities and no lower extremity edema present bilateral. There is no pain with calf compression, swelling,  warmth, erythema.   Neruologic: Grossly intact via light touch bilateral. Vibratory intact via tuning fork bilateral. Protective threshold with Semmes Wienstein monofilament intact to all pedal sites bilateral.   Musculoskeletal: Fluid-filled mobile soft tissue mass to the dorsal lateral aspect the right foot with tenderness palpation.  No swelling erythema or increased warmth.  No crepitation.  There is also significant HAV present in the left side and moderate on the right side.  No pain along the findings today.  Mild decrease in first MPJ range of motion.  No other areas of tenderness identified today.  Muscular strength 5/5 in all groups tested bilateral.  Gait: Unassisted, Nonantalgic.       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  Significant HAV is present bilaterally.  Skin marker was utilized to identify an area of soft tissue mass in the right foot without any underlying calcification foreign body present. -Regards the findings we discussed both conservative as well as surgical treatment options.  At this time her pain is minimal to the bunions and I recommended continuous shoe gear modifications, offloading and padding.  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 -Regards to the ganglion cyst on the right foot I recommended aspiration and steroid injection.  Understanding risks and complications she wished to proceed with this.  Area was cleaned with alcohol and 3 cc of a one-to-one mixture of 2% lidocaine plain 0.5% Marcaine plain was infiltrated in a regional block fashion around the mass.  Once anesthetized the skin was prepped with Betadine.  An 18-gauge needle was utilized to aspirate clear, gel-like  fluid.  She is having some discomfort so I tried to manually express the fluid I was able to get quite a bit of clear gel-like fluid coming from the area.  No pus.  After I was no longer able to get any fluid I injected 1 cc mixture of Kenalog as well as 0.5% Marcaine plain into the area.  A compression bandage was applied.  She tolerated without any complications.  Postinjection care was discussed.  Discussed surgical intervention should it come back.  Trula Slade DPM

## 2017-04-09 ENCOUNTER — Other Ambulatory Visit: Payer: Self-pay | Admitting: Family Medicine

## 2017-04-09 DIAGNOSIS — J209 Acute bronchitis, unspecified: Secondary | ICD-10-CM

## 2017-04-19 ENCOUNTER — Ambulatory Visit: Payer: Managed Care, Other (non HMO) | Admitting: Podiatry

## 2017-04-19 DIAGNOSIS — M6740 Ganglion, unspecified site: Secondary | ICD-10-CM | POA: Diagnosis not present

## 2017-04-20 NOTE — Progress Notes (Signed)
Subjective: Berlynn presents the office today for concerns of the cyst on the also aspect of the right foot starting to come back.  She states it is not anywhere near like it was not is not painful except if she puts some pressure to the area and even this is better.  She denies any redness.  She has noticed some bruising on the area since the aspiration.  She denies any recent injury or trauma she has no other concerns today. Denies any systemic complaints such as fevers, chills, nausea, vomiting. No acute changes since last appointment, and no other complaints at this time.   Objective: AAO x3, NAD DP/PT pulses palpable bilaterally, CRT less than 3 seconds Mild recurrence of the ganglion cyst to the lateral aspect of the foot.  There is mild ecchymosis overlying the area.  There is no tenderness palpation of the area.  There is only a small fluid-filled mass present and is substantially improved compared to what it was. No open lesions or pre-ulcerative lesions.  No pain with calf compression, swelling, warmth, erythema  Assessment: Ganglion cyst right foot  Plan: -All treatment options discussed with the patient including all alternatives, risks, complications.  -At this time we discussed various treatment options.  We discussed observation, injection, aspiration, surgery.  After discussion she elected to proceed to wash the area.  I will see her back in the next 6 weeks to see how she is doing.  He states the same we will try to draw some fluid off of it gets larger we discussed surgical intervention.  I dispensed a compression anklet to see if this will help as well.  Also discussed warm compress and ice to the area/ -Patient encouraged to call the office with any questions, concerns, change in symptoms.   Trula Slade DPM

## 2017-04-22 ENCOUNTER — Ambulatory Visit: Payer: Managed Care, Other (non HMO) | Admitting: Gynecology

## 2017-04-22 ENCOUNTER — Encounter: Payer: Self-pay | Admitting: Gynecology

## 2017-04-22 VITALS — BP 124/80 | Ht 65.0 in | Wt 203.0 lb

## 2017-04-22 DIAGNOSIS — Z01411 Encounter for gynecological examination (general) (routine) with abnormal findings: Secondary | ICD-10-CM

## 2017-04-22 DIAGNOSIS — Z79890 Hormone replacement therapy (postmenopausal): Secondary | ICD-10-CM | POA: Diagnosis not present

## 2017-04-22 DIAGNOSIS — N952 Postmenopausal atrophic vaginitis: Secondary | ICD-10-CM

## 2017-04-22 DIAGNOSIS — N951 Menopausal and female climacteric states: Secondary | ICD-10-CM

## 2017-04-22 DIAGNOSIS — N907 Vulvar cyst: Secondary | ICD-10-CM | POA: Diagnosis not present

## 2017-04-22 DIAGNOSIS — K429 Umbilical hernia without obstruction or gangrene: Secondary | ICD-10-CM | POA: Diagnosis not present

## 2017-04-22 MED ORDER — ESTRADIOL 0.075 MG/24HR TD PTTW: 1.0000 | MEDICATED_PATCH | TRANSDERMAL | 12 refills | Status: DC

## 2017-04-22 NOTE — Addendum Note (Signed)
Addended by: Nelva Nay on: 04/22/2017 02:47 PM   Modules accepted: Orders

## 2017-04-22 NOTE — Patient Instructions (Signed)
Start the higher dose estrogen patch as we discussed.  Let me know if there are any issues.  Follow-up for annual exam in 1 year

## 2017-04-22 NOTE — Progress Notes (Signed)
    Lisandra Mathisen Nov 12, 1960 [id number]        57 y.o.  G2P1011 for annual gynecologic exam.  Patient notes she is continuing to have some hot flushes and sweats as well as vaginal dryness.  She is on estradiol 0.05 mg patches.  She notes she is much better since starting the patches but not totally resolved as far as symptoms.  Past medical history,surgical history, problem list, medications, allergies, family history and social history were all reviewed and documented as reviewed in the EPIC chart.  ROS:  Performed with pertinent positives and negatives included in the history, assessment and plan.   Additional significant findings : None   Exam: Caryn Bee assistant Vitals:   04/22/17 1401  BP: 124/80  Weight: 203 lb (92.1 kg)  Height: 5\' 5"  (1.651 m)   Body mass index is 33.78 kg/m.  General appearance:  Normal affect, orientation and appearance. Skin: Grossly normal HEENT: Without gross lesions.  No cervical or supraclavicular adenopathy. Thyroid normal.  Lungs:  Clear without wheezing, rales or rhonchi Cardiac: RR, without RMG Abdominal:  Soft, nontender, without masses, guarding, rebound, organomegaly or hernia Breasts:  Examined lying and sitting without masses, retractions, discharge or axillary adenopathy. Pelvic:  Ext, BUS, Vagina: With mild atrophic changes.  Multiple small classic appearing sebaceous cysts involving both labia majora.  Pap smear of vaginal cuff done  Adnexa: Without masses or tenderness    Anus and perineum: Normal   Rectovaginal: Normal sphincter tone without palpated masses or tenderness.    Assessment/Plan:  57 y.o. G43P1011 female for annual gynecologic exam.  Status post TAH RSO for endometriosis in the past.   1. Postmenopausal/atrophic genital changes/HRT.  Patient continued to have some menopausal symptoms although much improved since starting the estradiol 0.05 mg patch.  Also having vaginal dryness using lubricants intermittently.  We  again reviewed the whole issue of HRT, risks versus benefits.  Thrombosis such as stroke heart attack DVT and breast cancer issues discussed versus benefits as far as symptom relief cardiovascular and bone health.  Recommended increasing her dose slightly to 0.075 mg patch and see how she does with that from her symptom standpoint.  Patient agrees with this.  Refill times 1 year provided.  She will call if she does not feel she is getting adequate response. 2. Pap smear 2016.  Pap smear of vaginal cuff today.  No history of significant abnormal Pap smears.  Options to stop screening per current screening guidelines reviewed.  Will readdress on annual basis. 3. DEXA never.  Will plan further into the menopause. 4. Colonoscopy 2013.  Repeat at their recommended interval. 5. Mammography 10/2016.  Continue with annual mammography when due.  Breast exam normal today. 6. Sebaceous cysts both labia majora.  Classic in appearance.  Asymptomatic to the patient.  Continue to monitor and report any changes. 7. Health maintenance.  No routine lab work done as patient reports she is going to follow-up with Dr. Dennard Schaumann and due through his office.  Follow-up in 1 year, sooner if any issues with the HRT.   Anastasio Auerbach MD, 2:24 PM 04/22/2017

## 2017-04-27 ENCOUNTER — Encounter: Payer: Self-pay | Admitting: Gynecology

## 2017-04-27 LAB — PAP IG W/ RFLX HPV ASCU

## 2017-04-27 LAB — HUMAN PAPILLOMAVIRUS, HIGH RISK: HPV DNA High Risk: NOT DETECTED

## 2017-05-01 ENCOUNTER — Other Ambulatory Visit: Payer: Self-pay

## 2017-05-01 ENCOUNTER — Ambulatory Visit (HOSPITAL_COMMUNITY)
Admission: EM | Admit: 2017-05-01 | Discharge: 2017-05-01 | Disposition: A | Discharge status: Home / Self Care | Payer: Managed Care, Other (non HMO) | Attending: Family Medicine | Admitting: Family Medicine

## 2017-05-01 DIAGNOSIS — L03012 Cellulitis of left finger: Secondary | ICD-10-CM | POA: Diagnosis not present

## 2017-05-01 DIAGNOSIS — W268XXA Contact with other sharp object(s), not elsewhere classified, initial encounter: Secondary | ICD-10-CM

## 2017-05-01 MED ORDER — LIDOCAINE HCL (PF) 1 % IJ SOLN
INTRAMUSCULAR | Status: AC
  Filled 2017-05-01: qty 2

## 2017-05-01 MED ORDER — SULFAMETHOXAZOLE-TRIMETHOPRIM 800-160 MG PO TABS: 1.0000 | ORAL_TABLET | Freq: Two times a day (BID) | ORAL | 0 refills | Status: AC

## 2017-05-01 MED ORDER — CEFTRIAXONE SODIUM 1 G IJ SOLR
1.0000 g | Freq: Once | INTRAMUSCULAR | Status: AC
  Administered 2017-05-01: 1 g via INTRAMUSCULAR

## 2017-05-01 MED ORDER — CEFTRIAXONE SODIUM 1 G IJ SOLR
INTRAMUSCULAR | Status: AC
  Filled 2017-05-01: qty 10

## 2017-05-01 MED ORDER — HYDROCODONE-ACETAMINOPHEN 5-325 MG PO TABS: 1.0000 | ORAL_TABLET | Freq: Four times a day (QID) | ORAL | 0 refills | Status: DC | PRN

## 2017-05-01 NOTE — ED Provider Notes (Signed)
[place]   [id number] 05/01/17 Arrival Time: 1211  ASSESSMENT & PLAN:  1. Paronychia of finger of left hand     Meds ordered this encounter  Medications  . cefTRIAXone (ROCEPHIN) injection 1 g  . sulfamethoxazole-trimethoprim (BACTRIM DS,SEPTRA DS) 800-160 MG tablet    Sig: Take 1 tablet by mouth 2 (two) times daily for 10 days.    Dispense:  20 tablet    Refill:  0  . HYDROcodone-acetaminophen (NORCO/VICODIN) 5-325 MG tablet    Sig: Take 1 tablet by mouth every 6 (six) hours as needed for moderate pain or severe pain.    Dispense:  10 tablet    Refill:  0   Medication sedation precautions. Will f/u with PCP or here if not showing improvement over the next 24-36 hours; ED if needed. I doubt she has a felon. Reviewed expectations re: course of current medical issues. Questions answered. Outlined signs and symptoms indicating need for more acute intervention. Patient verbalized understanding. After Visit Summary given.   SUBJECTIVE:  Deborah Jennings is a 57 y.o. female who reports cutting her distal left second finger about 5 days ago. Oer the past 1-2 days reports increasing discomfort and erythema over L second finger around nail. Afebrile. Self-opened "swollen area" at home with some bloody/purulent drainage. Still tender. No specific aggravating or alleviating factors reported. No h/o similar.  ROS: As per HPI.   OBJECTIVE:  Vitals:   05/01/17 1249  BP: (!) 142/97  Pulse: 80  Temp: 98.6 F (37 C)  TempSrc: Oral  SpO2: 100%     General appearance: alert; no distress Skin: distal left second finger with erythema and swelling around nail folds; swelling and erythema extend proximally over DIP joint; approx 0.5 cm area of darkened skin/bruising just behind fingernail; pad of fingertip slightly tense Psychological: alert and cooperative; normal mood and affect   Allergies  Allergen Reactions  . Ampicillin Swelling    Past Medical History:    Diagnosis Date  . ASCUS of cervix with negative high risk HPV 04/2017  . Asthma   . Endometriosis   . IBS (irritable bowel syndrome)   . Migraine    Social History   Socioeconomic History  . Marital status: Married    Spouse name: Not on file  . Number of children: Not on file  . Years of education: Not on file  . Highest education level: Not on file  Occupational History  . Not on file  Social Needs  . Financial resource strain: Not on file  . Food insecurity:    Worry: Not on file    Inability: Not on file  . Transportation needs:    Medical: Not on file    Non-medical: Not on file  Tobacco Use  . Smoking status: Former Research scientist (life sciences)  . Smokeless tobacco: Never Used  Substance and Sexual Activity  . Alcohol use: Yes    Alcohol/week: 3.0 oz    Types: 5 Standard drinks or equivalent per week  . Drug use: Never  . Sexual activity: Yes    Birth control/protection: Surgical    Comment: 1st intercourse 57 yo-More than 5 partners  Lifestyle  . Physical activity:    Days per week: Not on file    Minutes per session: Not on file  . Stress: Not on file  Relationships  . Social connections:    Talks on phone: Not on file    Gets together: Not on file    Attends religious service: Not  on file    Active member of club or organization: Not on file    Attends meetings of clubs or organizations: Not on file    Relationship status: Not on file  Other Topics Concern  . Not on file  Social History Narrative  . Not on file         Vanessa Kick, MD 05/05/17 [phone]

## 2017-05-01 NOTE — Discharge Instructions (Addendum)
If not seeing improvement over the next 24-48 hours please proceed to the Emergency Department for evaluation.  Be aware, pain medications may cause drowsiness. Please do not drive, operate heavy machinery or make important decisions while on this medication, it can cloud your judgement.  Medications received today: Meds ordered this encounter  Medications   cefTRIAXone (ROCEPHIN) injection 1 g   sulfamethoxazole-trimethoprim (BACTRIM DS,SEPTRA DS) 800-160 MG tablet    Sig: Take 1 tablet by mouth 2 (two) times daily for 10 days.    Dispense:  20 tablet    Refill:  0   HYDROcodone-acetaminophen (NORCO/VICODIN) 5-325 MG tablet    Sig: Take 1 tablet by mouth every 6 (six) hours as needed for moderate pain or severe pain.    Dispense:  10 tablet    Refill:  0

## 2017-05-01 NOTE — ED Triage Notes (Signed)
Left pointer finger , cut it Monday and it was healing up and Thursday it started getting sore, per pt she can feel her heart rate and she was in pain, per pt her enter lft pointer finger is swollen and looks infected. Per pt she tried to treat it at home but she thinks it got worse

## 2017-05-06 ENCOUNTER — Ambulatory Visit (INDEPENDENT_AMBULATORY_CARE_PROVIDER_SITE_OTHER): Payer: Managed Care, Other (non HMO)

## 2017-05-06 ENCOUNTER — Ambulatory Visit (INDEPENDENT_AMBULATORY_CARE_PROVIDER_SITE_OTHER): Payer: Managed Care, Other (non HMO) | Admitting: Orthopaedic Surgery

## 2017-05-06 DIAGNOSIS — L03012 Cellulitis of left finger: Secondary | ICD-10-CM | POA: Diagnosis not present

## 2017-05-06 MED ORDER — TRAMADOL HCL 50 MG PO TABS: 50.0000 mg | ORAL_TABLET | Freq: Three times a day (TID) | ORAL | 2 refills | Status: DC | PRN

## 2017-05-06 MED ORDER — AMOXICILLIN-POT CLAVULANATE 875-125 MG PO TABS: 1.0000 | ORAL_TABLET | Freq: Two times a day (BID) | ORAL | 0 refills | Status: DC

## 2017-05-06 MED ORDER — NAPROXEN 500 MG PO TABS: 500.0000 mg | ORAL_TABLET | Freq: Two times a day (BID) | ORAL | 3 refills | Status: DC

## 2017-05-06 NOTE — Progress Notes (Signed)
Office Visit Note   Patient: Deborah Jennings           Date of Birth: 04-01-1960           MRN: [id number] Visit Date: 05/06/2017              Requested by: Susy Frizzle, MD [address] [place], [place] [address] PCP: Susy Frizzle, MD   Assessment & Plan: Visit Diagnoses:  1. Paronychia of left index finger     Plan: Impression is 57 year old female with partially treated left index finger paronychia.  Under sterile conditions and digital block I performed decompression of the paronychia and I took a sliver on the radial side of the nail.  This was decompressed with a 11 blade under sterile conditions.  No frank pus was expressed.  Bactroban ointment with dry dressing applied.  Patient instructed to perform Dial soap soaks twice a day with dressing changes.  Pain medicines and Augmentin were prescribed.  Patient states that she has no problems with amoxicillin.  Follow-up in 1 week for wound check.  Patient instructed to follow-up sooner if she develops any concerning signs or symptoms. Total face to face encounter time was greater than 45 minutes and over half of this time was spent in counseling and/or coordination of care.  Follow-Up Instructions: Return in about 1 week (around 05/13/2017) for wound check.   Orders:  Orders Placed This Encounter  Procedures  . XR Finger Index Left   Meds ordered this encounter  Medications  . amoxicillin-clavulanate (AUGMENTIN) 875-125 MG tablet    Sig: Take 1 tablet by mouth 2 (two) times daily.    Dispense:  20 tablet    Refill:  0  . naproxen (NAPROSYN) 500 MG tablet    Sig: Take 1 tablet (500 mg total) by mouth 2 (two) times daily with a meal.    Dispense:  30 tablet    Refill:  3  . traMADol (ULTRAM) 50 MG tablet    Sig: Take 1-2 tablets (50-100 mg total) by mouth 3 (three) times daily as needed.    Dispense:  30 tablet    Refill:  2      Procedures: No procedures performed   Clinical Data: No additional  findings.   Subjective: Chief Complaint  Patient presents with  . Left Hand - Wound Check    HPI patient is a pleasant 57 year old female who presents to our clinic today with an infection to her left index finger.  She states that on 04/26/2017, she was cutting with a kitchen knife and sliced the distal aspect of her index finger.  A few days later she was experiencing soreness, so she applied An oil.  The next day, the finger was much more painful and started to look infected.  She sterilized a needle and drained this herself.  The next day, Saturday, the finger swelled up once again but looks as if it was all pus.  She went to [facility] urgent care where she was evaluated.  She was given a shot of Rocephin as well as oral Bactrim.  She has been taking this twice a day since.  She has not noticed any relief in pain or appearance of the finger.  No fevers or chills.  No other systemic symptoms.  Review of Systems as detailed in HPI.  All others reviewed and are negative.   Objective: Vital Signs: There were no vitals taken for this visit.  Physical Exam  well-developed well-nourished female in no acute distress.  Alert and oriented x3.  Ortho Exam examination of her index finger of the left hand reveals moderate swelling and tenderness to the distal aspect.  Fingertip is soft. Limited range of motion secondary to pain and swelling.  Specialty Comments:  No specialty comments available.  Imaging: No results found.   PMFS History: Patient Active Problem List   Diagnosis Date Noted  . Migraine   . Endometriosis   . IBS (irritable bowel syndrome)    Past Medical History:  Diagnosis Date  . ASCUS of cervix with negative high risk HPV 04/2017  . Asthma   . Endometriosis   . IBS (irritable bowel syndrome)   . Migraine     Family History  Problem Relation Age of Onset  . Diabetes Father   . Hypertension Father   . Hypertension Mother   . Dementia Mother     Past Surgical History:    Procedure Laterality Date  . ABDOMINAL HYSTERECTOMY     RSO  . CESAREAN SECTION    . DILATION AND CURETTAGE OF UTERUS    . lower back surgery     "[place]"  . OOPHORECTOMY     RSO  . PELVIC LAPAROSCOPY     Lysis of adhesions-Endometriosis   Social History   Occupational History  . Not on file  Tobacco Use  . Smoking status: Former Research scientist (life sciences)  . Smokeless tobacco: Never Used  Substance and Sexual Activity  . Alcohol use: Yes    Alcohol/week: 3.0 oz    Types: 5 Standard drinks or equivalent per week  . Drug use: Never  . Sexual activity: Yes    Birth control/protection: Surgical    Comment: 1st intercourse 57 yo-More than 5 partners

## 2017-05-13 ENCOUNTER — Ambulatory Visit (INDEPENDENT_AMBULATORY_CARE_PROVIDER_SITE_OTHER): Payer: Managed Care, Other (non HMO) | Admitting: Orthopaedic Surgery

## 2017-05-13 ENCOUNTER — Encounter (INDEPENDENT_AMBULATORY_CARE_PROVIDER_SITE_OTHER): Payer: Self-pay | Admitting: Orthopaedic Surgery

## 2017-05-13 DIAGNOSIS — L03011 Cellulitis of right finger: Secondary | ICD-10-CM | POA: Insufficient documentation

## 2017-05-13 DIAGNOSIS — L03012 Cellulitis of left finger: Secondary | ICD-10-CM

## 2017-05-13 NOTE — Progress Notes (Signed)
      Patient: Deborah Jennings           Date of Birth: 1960-07-03           MRN: [id number] Visit Date: 05/13/2017 PCP: Susy Frizzle, MD   Assessment & Plan:  Chief Complaint:  Chief Complaint  Patient presents with  . Left Index Finger - Pain, Follow-up   Visit Diagnoses:  1. Paronychia of finger of left hand     Plan: Patient comes in for follow-up of decompression of paronychia of the index finger of the left hand.  She is doing much better.  She has noticed some bloody and occasional clear drainage from the wound.  No purulent drainage.  Pain is much better.  No fevers or chills.  She has been on the Augmentin without any issues.  Examination of the left hand reveals moderate swelling with mild erythema to the index finger.  No active drainage noted.  moderate tenderness throughout.  She is neurovascularly intact distally.  At this point, we will have her continue with wet-to-dry dressing changes with mupirocin.  She will follow-up with [place] in 2 weeks time for repeat evaluation.  Call concern or questions in the meantime  Follow-Up Instructions: Return in about 2 weeks (around 05/27/2017).   Orders:  No orders of the defined types were placed in this encounter.  No orders of the defined types were placed in this encounter.   Imaging: No results found.  PMFS History: Patient Active Problem List   Diagnosis Date Noted  . Paronychia of finger of left hand 05/13/2017  . Migraine   . Endometriosis   . IBS (irritable bowel syndrome)    Past Medical History:  Diagnosis Date  . ASCUS of cervix with negative high risk HPV 04/2017  . Asthma   . Endometriosis   . IBS (irritable bowel syndrome)   . Migraine     Family History  Problem Relation Age of Onset  . Diabetes Father   . Hypertension Father   . Hypertension Mother   . Dementia Mother     Past Surgical History:  Procedure Laterality Date  . ABDOMINAL HYSTERECTOMY     RSO  . CESAREAN SECTION    . DILATION  AND CURETTAGE OF UTERUS    . lower back surgery     "[place]"  . OOPHORECTOMY     RSO  . PELVIC LAPAROSCOPY     Lysis of adhesions-Endometriosis   Social History   Occupational History  . Not on file  Tobacco Use  . Smoking status: Former Research scientist (life sciences)  . Smokeless tobacco: Never Used  Substance and Sexual Activity  . Alcohol use: Yes    Alcohol/week: 3.0 oz    Types: 5 Standard drinks or equivalent per week  . Drug use: Never  . Sexual activity: Yes    Birth control/protection: Surgical    Comment: 1st intercourse 57 yo-More than 5 partners

## 2017-05-25 ENCOUNTER — Ambulatory Visit: Payer: Managed Care, Other (non HMO) | Admitting: Podiatry

## 2017-05-25 DIAGNOSIS — M6740 Ganglion, unspecified site: Secondary | ICD-10-CM | POA: Diagnosis not present

## 2017-05-25 NOTE — Patient Instructions (Signed)

## 2017-05-26 ENCOUNTER — Other Ambulatory Visit: Payer: Self-pay | Admitting: Podiatry

## 2017-05-26 ENCOUNTER — Telehealth: Payer: Self-pay | Admitting: *Deleted

## 2017-05-26 DIAGNOSIS — M6740 Ganglion, unspecified site: Secondary | ICD-10-CM

## 2017-05-26 NOTE — Telephone Encounter (Signed)
-----   Message from Trula Slade, DPM sent at 05/26/2017  1:39 PM EDT ----- Can you please order a diagnostic ultrasound to evaluate the soft tissue mass on the right foot. This is for pre-op. She is scheduled for surgery on 06/16/2017

## 2017-05-26 NOTE — Progress Notes (Signed)
Subjective: 57 year old female presents the office today for follow-up evaluation and recurrence of the cyst of the right foot.  She states is not painful but when it was however has come back and has a small blue discoloration to the area.  Area is painful with pressure in shoes.  No treatment for the last saw her she has no new concerns.  No recent injury. Denies any systemic complaints such as fevers, chills, nausea, vomiting. No acute changes since last appointment, and no other complaints at this time.   Objective: AAO x3, NAD DP/PT pulses palpable bilaterally, CRT less than 3 seconds Along the lateral aspect of the right foot continues to be a reoccurrence of a fluid-filled mobile soft tissue mass consistent with a ganglion cyst.  There is a vein overlying the area does have a slight blue discoloration to it.  There is no pulsating within the mass.  There is tenderness palpation to the area.  No other cyst identified. No open lesions or pre-ulcerative lesions.  No pain with calf compression, swelling, warmth, erythema  Assessment: Recurrent soft tissue mass right foot  Plan: -All treatment options discussed with the patient including all alternatives, risks, complications.  -Given the recurrence I discussed with conservative as well as surgical treatment options.  We will order an ultrasound to make sure there is no vascular structures to the mass.  We discussed aspiration versus surgical intervention.  At this point she wished to go and proceed with surgery to excise the mass.  Discussed this is not a guarantee resolution of symptoms there is a chance of recurrence or need for further surgery.  She wishes to proceed with surgical excision. -The incision placement as well as the postoperative course was discussed with the patient. I discussed risks of the surgery which include, but not limited to, infection, bleeding, pain, swelling, need for further surgery, delayed or nonhealing, painful or  ugly scar, numbness or sensation changes, over/under correction, recurrence, transfer lesions, further deformity, hardware failure, DVT/PE, loss of toe/foot. Patient understands these risks and wishes to proceed with surgery. The surgical consent was reviewed with the patient all 3 pages were signed. No promises or guarantees were given to the outcome of the procedure. All questions were answered to the best of my ability. Before the surgery the patient was encouraged to call the office if there is any further questions. The surgery will be performed at the [facility] on an outpatient basis. -Patient encouraged to call the office with any questions, concerns, change in symptoms.   Trula Slade DPM

## 2017-05-27 NOTE — Telephone Encounter (Signed)
Faxed orders to [facility]. 

## 2017-05-31 ENCOUNTER — Ambulatory Visit (INDEPENDENT_AMBULATORY_CARE_PROVIDER_SITE_OTHER): Payer: Managed Care, Other (non HMO) | Admitting: Orthopaedic Surgery

## 2017-06-02 ENCOUNTER — Telehealth: Payer: Self-pay | Admitting: *Deleted

## 2017-06-02 ENCOUNTER — Telehealth: Payer: Self-pay | Admitting: Podiatry

## 2017-06-02 NOTE — Telephone Encounter (Signed)
I am calling to let you know that Dr. Jacqualyn Posey said it was okay for you not to have the MRI.  "Okay great, what time will my surgery be?  I haven't heard from anybody."  You will hear from someone from the surgical center a day or two prior to your surgery date.  They will give you your arrival time when they call you.  "Let me ask you this, that place has a red streak on it.  It's like a pin point area but nothing has leaked out of it or anything.  What do you think may be going on?"  I'd have to ask Dr. Jacqualyn Posey.  I don't know.  "Well I will just keep an eye on it for the next couple of days.  Nothing is oozing out ou it.  I haven't been wearing a shoe on it because it hurts.  I just been wearing flip flops.  If it gets worse, I will let you know."

## 2017-06-02 NOTE — Telephone Encounter (Signed)
OK thanks. Delydia- can you please let her know that is fine.

## 2017-06-02 NOTE — Telephone Encounter (Signed)
Patient does not want to have the MRI done now- She was told it would cost 2500 to have it done and her deductible is 3000 so it would be completely out of pocket. She just wants to have the surgery. If you can call patient back at [phone]

## 2017-06-02 NOTE — Telephone Encounter (Signed)
She should be scheduled already.

## 2017-06-03 NOTE — Telephone Encounter (Signed)
If it is a red streak it could be infection. I would like to see her to make sure.

## 2017-06-04 ENCOUNTER — Encounter: Payer: Self-pay | Admitting: Podiatry

## 2017-06-04 ENCOUNTER — Ambulatory Visit: Payer: Managed Care, Other (non HMO) | Admitting: Podiatry

## 2017-06-04 DIAGNOSIS — M6740 Ganglion, unspecified site: Secondary | ICD-10-CM

## 2017-06-04 NOTE — Telephone Encounter (Signed)
I am calling to see how you are doing.  "It's about the same.  It hurts and it's still red."  Dr. Jacqualyn Posey would like for you to come in and let him check it.  He said it may be infected.  Can you come in today so he can check it?  "Yes, I can come in today, what time?"  He has a 10:45 am and a 1:15 pm available.  "I can do the 1:15 pm."  I will schedule it.  See you this afternoon.

## 2017-06-06 NOTE — Progress Notes (Signed)
Subjective: Dama presents the office today for follow-up evaluation and concerns of mild increasing pain along the soft tissue mostly also aspect of her right foot.  Due to the cost she did not want to get the MRI performed and she did wear a new pair shoes which may put pressure to the area and since then she noticed a red spot on the mass area.  Denies any increase in warmth or any openings or any drainage.  She states that since the area has gotten larger she is concerned that is coming burst.  She has no other concerns. Denies any systemic complaints such as fevers, chills, nausea, vomiting. No acute changes since last appointment, and no other complaints at this time.   Objective: AAO x3, NAD DP/PT pulses palpable bilaterally, CRT less than 3 seconds On the lateral aspect of the right foot there is a fluid-filled mobile soft tissue mass consistent with a ganglion cyst.  There is a small amount of red area overlying the cyst but this is from superficial skin abrasions likely from where she whether she went rubbed.  This is not an infection redness is more of an abrasion.  There is no increase in warmth.  There is no surrounding erythema or ascending cellulitis.  There is no open lesions identified. No open lesions or pre-ulcerative lesions.  No pain with calf compression, swelling, warmth, erythema  Assessment: Ganglion cyst right foot, abrasion  Plan: -All treatment options discussed with the patient including all alternatives, risks, complications.  -At this time this is not an infection this is an abrasion from her that she would rub.  This is superficial there is no signs of infection.  Continue to offload and continue wearing open toed shoe.  She is scheduled for surgery in 2 weeks but will try to see if he can move it up to next week.  We can discuss the surgery and she has no further questions.  She can keep a small amount of antibiotic ointment on the abrasion daily. -Patient  encouraged to call the office with any questions, concerns, change in symptoms.   Trula Slade DPM

## 2017-06-07 ENCOUNTER — Telehealth: Payer: Self-pay | Admitting: *Deleted

## 2017-06-07 NOTE — Telephone Encounter (Signed)
If she wants to go do this week I can just do it if the surgery center will allow me to work that late.

## 2017-06-07 NOTE — Telephone Encounter (Signed)
"  Dr. Jacqualyn Posey was going to speak to you regarding calling me back.  I did call [employer] and it's going to be at least a one hour nerve test for my husband.  So we were trying to figure out how long it would take, between 10:15 am to 11:30 am.  So, we can schedule it after that on May 9.  Give me a call back.  Thank you."

## 2017-06-07 NOTE — Telephone Encounter (Signed)
I'm calling to let you know Dr. Jacqualyn Posey cannot do your case this Wednesday.  "I know, I received a call from someone at the surgery center.  They said my surgery is on May 15.  They told me to be there at 6 am and asked me to go ahead and register so they will already have all my information.  Plus, I had told him the wrong date.  My husband's appointment is on Thursday instead of Wednesday.  I had messed up.  So everything has turned out fine."

## 2017-06-13 ENCOUNTER — Other Ambulatory Visit: Payer: Self-pay | Admitting: Gynecology

## 2017-06-16 ENCOUNTER — Encounter: Payer: Self-pay | Admitting: Podiatry

## 2017-06-16 ENCOUNTER — Other Ambulatory Visit: Payer: Self-pay | Admitting: Podiatry

## 2017-06-16 DIAGNOSIS — D492 Neoplasm of unspecified behavior of bone, soft tissue, and skin: Secondary | ICD-10-CM | POA: Diagnosis not present

## 2017-06-16 MED ORDER — PROMETHAZINE HCL 25 MG PO TABS: 25.0000 mg | ORAL_TABLET | Freq: Three times a day (TID) | ORAL | 0 refills | Status: DC | PRN

## 2017-06-16 MED ORDER — CLINDAMYCIN HCL 300 MG PO CAPS: 300.0000 mg | ORAL_CAPSULE | Freq: Three times a day (TID) | ORAL | 0 refills | Status: DC

## 2017-06-16 MED ORDER — OXYCODONE-ACETAMINOPHEN 5-325 MG PO TABS: 1.0000 | ORAL_TABLET | Freq: Four times a day (QID) | ORAL | 0 refills | Status: DC | PRN

## 2017-06-21 ENCOUNTER — Encounter: Payer: Self-pay | Admitting: Podiatry

## 2017-06-21 ENCOUNTER — Ambulatory Visit (INDEPENDENT_AMBULATORY_CARE_PROVIDER_SITE_OTHER): Payer: Managed Care, Other (non HMO) | Admitting: Podiatry

## 2017-06-21 VITALS — Temp 98.6°F

## 2017-06-21 DIAGNOSIS — Z9889 Other specified postprocedural states: Secondary | ICD-10-CM

## 2017-06-21 DIAGNOSIS — M6740 Ganglion, unspecified site: Secondary | ICD-10-CM

## 2017-06-22 ENCOUNTER — Telehealth: Payer: Self-pay | Admitting: Podiatry

## 2017-06-22 MED ORDER — OXYCODONE-ACETAMINOPHEN 5-325 MG PO TABS: 1.0000 | ORAL_TABLET | Freq: Four times a day (QID) | ORAL | 0 refills | Status: DC | PRN

## 2017-06-22 NOTE — Telephone Encounter (Signed)
Patient stated she came in yesterday and was suppose to be prescribed a pain medication. When she called the pharmacy nothing was there. She would like a call back at [phone]

## 2017-06-22 NOTE — Telephone Encounter (Signed)
Left message informing pt the rx had been sent to the pharmacy.

## 2017-06-22 NOTE — Progress Notes (Signed)
Subjective: Deborah Jennings is a 57 y.o. is seen today in office s/p right foot soft tissue mass excision preformed on 06/16/2017. They state their pain is improving but she still taking pain medication.  She is asking for refill today.  She is been in the surgical boot.  She gets intermittent intermittent sharp pain but localized on surgical side she denies any sharp pain into the toe or up the foot.. Denies any systemic complaints such as fevers, chills, nausea, vomiting. No calf pain, chest pain, shortness of breath.   Objective: General: No acute distress, AAOx3  DP/PT pulses palpable 2/4, CRT < 3 sec to all digits.  Protective sensation intact. Motor function intact.  Right foot: Incision is well coapted without any evidence of dehiscence and sutures are intact. There is no surrounding erythema, ascending cellulitis, fluctuance, crepitus, malodor, drainage/purulence. There is mild edema around the surgical site. There is mild pain along the surgical site.  No other areas of tenderness to bilateral lower extremities.  No other open lesions or pre-ulcerative lesions.  No pain with calf compression, swelling, warmth, erythema.   Assessment and Plan:  Status post right foot surgery, doing well with no complications   -Treatment options discussed including all alternatives, risks, and complications -Antibiotic ointment was applied today followed by a bandage.  Keep the dressing clean, dry, intact. -Wear CAM boot at all times -Ice/elevation -Pain medication as needed.  Refill this for her today. -Monitor for any clinical signs or symptoms of infection and DVT/PE and directed to call the office immediately should any occur or go to the ER. -Follow-up 10 days for suture removal or sooner if any problems arise. In the meantime, encouraged to call the office with any questions, concerns, change in symptoms.   Celesta Gentile, DPM

## 2017-06-22 NOTE — Telephone Encounter (Signed)
done

## 2017-06-29 ENCOUNTER — Encounter: Payer: Managed Care, Other (non HMO) | Admitting: Podiatry

## 2017-07-01 ENCOUNTER — Ambulatory Visit (INDEPENDENT_AMBULATORY_CARE_PROVIDER_SITE_OTHER): Payer: Managed Care, Other (non HMO) | Admitting: Podiatry

## 2017-07-01 ENCOUNTER — Encounter: Payer: Self-pay | Admitting: Podiatry

## 2017-07-01 DIAGNOSIS — M6740 Ganglion, unspecified site: Secondary | ICD-10-CM | POA: Diagnosis not present

## 2017-07-01 DIAGNOSIS — Z9889 Other specified postprocedural states: Secondary | ICD-10-CM

## 2017-07-04 NOTE — Progress Notes (Signed)
Subjective: Deborah Jennings is a 57 y.o. is seen today in office s/p right foot soft tissue mass excision preformed on 06/16/2017.  She states that she is doing better.  She gets some sharp pain sharply along the incision but denies any sharp pains going up the leg or down into the toes.  She has remained in the surgical boot.  She states her pain is overall improving.  She denies any systemic complaints including fevers, chills, nausea, vomiting. No calf pain, chest pain, shortness of breath.   Objective: General: No acute distress, AAOx3  DP/PT pulses palpable 2/4, CRT < 3 sec to all digits.  Protective sensation intact. Motor function intact.  Right foot: Incision is well coapted without any evidence of dehiscence and sutures are intact.  There is mild motion across the central portion of the incision.  There is no surrounding erythema, ascending cellulitis, fluctuance, crepitus, malodor, drainage/purulence. There is decreased edema around the surgical site. There is decreased pain along the surgical site.  No other areas of tenderness to bilateral lower extremities.  No other open lesions or pre-ulcerative lesions.  No pain with calf compression, swelling, warmth, erythema.   Assessment and Plan:  Status post right foot surgery, doing well with no complications   -Treatment options discussed including all alternatives, risks, and complications -The stitches on either end of the incision but the central aspect had some motions are left the sutures intact.  Antibiotic ointment and a bandage was applied.  She can change this every couple days if needed bisoprolol when the area wet.  Transition to a surgical shoe and this was dispensed today.  Continue to ice and elevate.  Pain medication as needed. -Monitor for any clinical signs or symptoms of infection and DVT/PE and directed to call the office immediately should any occur or go to the ER. -Follow-up in 1 week for suture removal or sooner if any  problems arise. In the meantime, encouraged to call the office with any questions, concerns, change in symptoms.   Celesta Gentile, DPM

## 2017-07-08 ENCOUNTER — Ambulatory Visit (INDEPENDENT_AMBULATORY_CARE_PROVIDER_SITE_OTHER): Payer: Self-pay | Admitting: Podiatry

## 2017-07-08 DIAGNOSIS — Z9889 Other specified postprocedural states: Secondary | ICD-10-CM

## 2017-07-08 DIAGNOSIS — M6740 Ganglion, unspecified site: Secondary | ICD-10-CM

## 2017-07-11 NOTE — Progress Notes (Signed)
Subjective: Deborah Jennings is a 57 y.o. is seen today in office s/p right foot soft tissue mass excision preformed on 06/16/2017.  She states the area is doing better and only a little bit of tenderness.  She presents today for suture removal.  She is remained in the cam boot.  Her pain is improving.  No other concerns.  Does not report any changes.  She denies any systemic complaints including fevers, chills, nausea, vomiting. No calf pain, chest pain, shortness of breath.   Objective: General: No acute distress, AAOx3  DP/PT pulses palpable 2/4, CRT < 3 sec to all digits.  Protective sensation intact. Motor function intact.  Right foot: Incision is well coapted without any evidence of dehiscence and sutures are intact.  Incision appears to be well coapted and the scab is forming along the incision.  There is no surrounding erythema, ascending sialitis.  There is no fluctuation or crepitation or any malodor.  No drainage or pus.  Minimal tenderness palpation from the surgical site.  Subjectively she is getting some sharp pain starting on the incision but not shooting into the toes of the foot. No other open lesions or pre-ulcerative lesions.  No pain with calf compression, swelling, warmth, erythema.   Assessment and Plan:  Status post right foot surgery, doing well with no complications  -Treatment options discussed including all alternatives, risks, and complications -Sutures removed to any complications.  Steri-Strips were applied for reinforcement followed by antibiotic ointment and a dressing.  She can do this on a daily basis as well.  Surgical shoe.  Ice and elevation.  Pain medication as needed but she has not been needing this. -Reviewed pathology with her and gave her a copy of the results -Monitor for any clinical signs or symptoms of infection and directed to call the office immediately should any occur or go to the ER.  Return in about 10 days (around 07/18/2017).  Trula Slade  DPM

## 2017-07-13 ENCOUNTER — Telehealth (INDEPENDENT_AMBULATORY_CARE_PROVIDER_SITE_OTHER): Payer: Self-pay | Admitting: Orthopaedic Surgery

## 2017-07-13 NOTE — Telephone Encounter (Signed)
See message below °

## 2017-07-13 NOTE — Telephone Encounter (Signed)
She was supposed to follow up.

## 2017-07-13 NOTE — Telephone Encounter (Signed)
Patient called and wanted to know if Dr. Erlinda Hong wanted to see her after the nail came completely off or did he want to see her before to make sure there is no more infection.  CB#[phone].  Thank you.

## 2017-07-14 NOTE — Telephone Encounter (Signed)
Can you make appt please see message below.

## 2017-07-15 NOTE — Telephone Encounter (Signed)
07/21/17 3:15pm appt made

## 2017-07-20 ENCOUNTER — Encounter: Payer: Managed Care, Other (non HMO) | Admitting: Podiatry

## 2017-07-21 ENCOUNTER — Encounter (INDEPENDENT_AMBULATORY_CARE_PROVIDER_SITE_OTHER): Payer: Self-pay | Admitting: Orthopaedic Surgery

## 2017-07-21 ENCOUNTER — Ambulatory Visit (INDEPENDENT_AMBULATORY_CARE_PROVIDER_SITE_OTHER): Payer: Managed Care, Other (non HMO) | Admitting: Orthopaedic Surgery

## 2017-07-21 DIAGNOSIS — L03012 Cellulitis of left finger: Secondary | ICD-10-CM | POA: Diagnosis not present

## 2017-07-21 NOTE — Progress Notes (Signed)
      Patient: Deborah Jennings           Date of Birth: [birth year]           MRN: [id number] Visit Date: 07/21/2017 PCP: Susy Frizzle, MD   Assessment & Plan:  Chief Complaint:  Chief Complaint  Patient presents with  . Left Index Finger - Pain   Visit Diagnoses:  1. Paronychia of finger of left hand     Plan: Patient is a pleasant 57 year old female who presents to our clinic today for follow-up of her left index finger paronychia.  She still has moderate tenderness to the tip of the finger but no drainage noted.  Her nail is still in place but is close to falling off.  Examination of her left index finger reveals moderate and diffuse tenderness.  No cellulitis or signs of infection.  No drainage.  Her nail is still attached.  At this point, we have reassured her that it is normal to have some tenderness to that area.  She can trim her nail at this point.  She will follow-up with [place] as needed.  Call with concerning symptoms in the meantime. Total face to face encounter time was greater than 25 minutes and over half of this time was spent in counseling and/or coordination of care.  Follow-Up Instructions: Return if symptoms worsen or fail to improve.   Orders:  No orders of the defined types were placed in this encounter.  No orders of the defined types were placed in this encounter.   Imaging: No results found.  PMFS History: Patient Active Problem List   Diagnosis Date Noted  . Ganglion 05/26/2017  . Paronychia of finger of left hand 05/13/2017  . Avulsion fracture of right ankle 03/29/2014  . Right shoulder pain 12/05/2013  . Sprain of right ankle 12/05/2013  . Migraine   . Endometriosis   . IBS (irritable bowel syndrome)    Past Medical History:  Diagnosis Date  . ASCUS of cervix with negative high risk HPV 04/2017  . Asthma   . Endometriosis   . IBS (irritable bowel syndrome)   . Migraine     Family History  Problem Relation Age of Onset  . Diabetes  Father   . Hypertension Father   . Hypertension Mother   . Dementia Mother     Past Surgical History:  Procedure Laterality Date  . ABDOMINAL HYSTERECTOMY     RSO  . CESAREAN SECTION    . DILATION AND CURETTAGE OF UTERUS    . lower back surgery     "[place]"  . OOPHORECTOMY     RSO  . PELVIC LAPAROSCOPY     Lysis of adhesions-Endometriosis   Social History   Occupational History  . Not on file  Tobacco Use  . Smoking status: Former Research scientist (life sciences)  . Smokeless tobacco: Never Used  Substance and Sexual Activity  . Alcohol use: Yes    Alcohol/week: 3.0 oz    Types: 5 Standard drinks or equivalent per week  . Drug use: Never  . Sexual activity: Yes    Birth control/protection: Surgical    Comment: 1st intercourse 57 yo-More than 5 partners

## 2017-08-02 ENCOUNTER — Other Ambulatory Visit (INDEPENDENT_AMBULATORY_CARE_PROVIDER_SITE_OTHER): Payer: Self-pay | Admitting: Orthopaedic Surgery

## 2017-08-10 ENCOUNTER — Ambulatory Visit (INDEPENDENT_AMBULATORY_CARE_PROVIDER_SITE_OTHER): Payer: Managed Care, Other (non HMO) | Admitting: Podiatry

## 2017-08-10 DIAGNOSIS — Z9889 Other specified postprocedural states: Secondary | ICD-10-CM

## 2017-08-10 DIAGNOSIS — M6740 Ganglion, unspecified site: Secondary | ICD-10-CM

## 2017-08-10 MED ORDER — SULFAMETHOXAZOLE-TRIMETHOPRIM 800-160 MG PO TABS: 1.0000 | ORAL_TABLET | Freq: Two times a day (BID) | ORAL | 0 refills | Status: DC

## 2017-08-11 NOTE — Progress Notes (Signed)
Subjective: 57 year old female presents the office today for concerns of some burning on the incision on the right foot.  She recent underwent soft tissue mass excision.  She states that she has been active and she has been in the yard working and she admits that she did this after surgery but she would keep a bag over the foot.  Today she is concerned for possible infection as there is been some burning to the incision site.  She denies any opening to the incision she denies any drainage or pus coming from the area she denies any red streaks or increase in swelling.  She also denies any systemic complaints  such as fevers, chills, nausea, vomiting. No acute changes since last appointment, and no other complaints at this time.   Objective: AAO x3, NAD DP/PT pulses palpable bilaterally, CRT less than 3 seconds Incision along the lateral aspect the right foot appears to be well-healed pain on the proximal aspect there is a faint hyperkeratotic lesion was slight surrounding erythema.  Unable to debride this today and there is a very minimal amount of clear drainage but there is no purulence.  I think this is from a suture reaction from a subcutaneous suture.  There is no probing, undermining or tunneling.  There is no fluctuation or crepitation.  There is no ascending sialitis.  There is no malodor. No open lesions or pre-ulcerative lesions.  No pain with calf compression, swelling, warmth, erythema  Assessment: 57 year old female with suture reaction right side  Plan:  -All treatment options discussed with the patient including all alternatives, risks, complications.  -I debrided the proximal aspect the incision today.  Area was cleaned and irrigated.  Antibiotic ointment and a bandage was applied.  There is no drainage significant of the culture as this would be a superficial culture.  Prescribed Bactrim today as a precaution.  Daily dressing changes.  Hold off on getting the area dirty and go outside  without covering to the area make sure the area stays clean. -Monitor for any clinical signs or symptoms of infection and directed to call the office immediately should any occur or go to the ER. -Patient encouraged to call the office with any questions, concerns, change in symptoms.   Return in about 2 weeks (around 08/24/2017).  Trula Slade DPM

## 2017-08-27 ENCOUNTER — Encounter: Payer: Managed Care, Other (non HMO) | Admitting: Podiatry

## 2017-09-13 ENCOUNTER — Encounter: Payer: Self-pay | Admitting: Podiatry

## 2017-09-13 ENCOUNTER — Ambulatory Visit (INDEPENDENT_AMBULATORY_CARE_PROVIDER_SITE_OTHER): Payer: Managed Care, Other (non HMO) | Admitting: Podiatry

## 2017-09-13 DIAGNOSIS — Z9889 Other specified postprocedural states: Secondary | ICD-10-CM

## 2017-09-13 DIAGNOSIS — M6740 Ganglion, unspecified site: Secondary | ICD-10-CM

## 2017-09-14 NOTE — Progress Notes (Signed)
Subjective: 57 year old female presents the office today for follow-up evaluation after undergoing soft tissue mass excision of the right foot.  Overall she states that she is doing better.  She is intermittently gets a sharp pain to the foot but overall is getting better.  Overall the pain is improving.  She still does report some pain but this is much better than what it was last appointment.  Denies any redness or swelling to the area.  She was just cut back from the beach as well.  She has no other concerns today.  She denies any systemic complaints  such as fevers, chills, nausea, vomiting. No acute changes since last appointment, and no other complaints at this time.   Objective: AAO x3, NAD DP/PT pulses palpable bilaterally, CRT less than 3 seconds Incision along the lateral aspect the right foot appears to be well-healed.  Mild thickening of the scar scar formation is present to the area but there is no surrounding erythema, ascending cellulitis.  Very minimal edema there is no fluctuation or crepitation is no clinical signs of infection.  No significant tenderness palpation of the surgical site.  No other areas of tenderness. No open lesions or pre-ulcerative lesions.  No pain with calf compression, swelling, warmth, erythema  Assessment: 57 year old female with resolving symptoms right foot  Plan:  -All treatment options discussed with the patient including all alternatives, risks, complications.  -At this point area is doing well and is much improved.  She is wearing a regular shoe without any issues.  Discussed with her she can apply cocoa butter or vitamin E cream to the incision daily to help with scar formation.  Continue wearing supportive shoes and ice at the end of the day.  At this point she is doing well there is no signs of infection the incision is completely healed she is back to wearing regular shoes and will discharge her from the postoperative course however encouraged her to  call any questions or concerns any changes in symptoms and she agrees with this plan.  Trula Slade DPM

## 2017-10-24 ENCOUNTER — Other Ambulatory Visit: Payer: Self-pay | Admitting: Family Medicine

## 2017-11-17 ENCOUNTER — Other Ambulatory Visit (INDEPENDENT_AMBULATORY_CARE_PROVIDER_SITE_OTHER): Payer: Self-pay | Admitting: Orthopaedic Surgery

## 2018-04-27 ENCOUNTER — Encounter: Payer: Managed Care, Other (non HMO) | Admitting: Gynecology

## 2018-05-12 ENCOUNTER — Encounter: Payer: Managed Care, Other (non HMO) | Admitting: Gynecology

## 2018-05-18 ENCOUNTER — Other Ambulatory Visit: Payer: Self-pay | Admitting: *Deleted

## 2018-05-18 MED ORDER — HYOSCYAMINE SULFATE ER 0.375 MG PO TB12: 0.3750 mg | ORAL_TABLET | Freq: Two times a day (BID) | ORAL | 1 refills | Status: DC

## 2018-06-23 ENCOUNTER — Encounter: Payer: Managed Care, Other (non HMO) | Admitting: Gynecology

## 2018-07-29 ENCOUNTER — Other Ambulatory Visit: Payer: Self-pay | Admitting: Family Medicine

## 2018-07-29 DIAGNOSIS — J209 Acute bronchitis, unspecified: Secondary | ICD-10-CM

## 2018-10-19 ENCOUNTER — Ambulatory Visit (INDEPENDENT_AMBULATORY_CARE_PROVIDER_SITE_OTHER): Payer: Managed Care, Other (non HMO) | Admitting: Orthopaedic Surgery

## 2018-10-19 ENCOUNTER — Ambulatory Visit (INDEPENDENT_AMBULATORY_CARE_PROVIDER_SITE_OTHER): Payer: Managed Care, Other (non HMO)

## 2018-10-19 ENCOUNTER — Encounter: Payer: Self-pay | Admitting: Orthopaedic Surgery

## 2018-10-19 VITALS — Ht 65.0 in | Wt 190.0 lb

## 2018-10-19 DIAGNOSIS — L03011 Cellulitis of right finger: Secondary | ICD-10-CM | POA: Diagnosis not present

## 2018-10-19 MED ORDER — AMOXICILLIN-POT CLAVULANATE 875-125 MG PO TABS: 1.0000 | ORAL_TABLET | Freq: Two times a day (BID) | ORAL | 0 refills | Status: DC

## 2018-10-19 NOTE — Progress Notes (Signed)
Office Visit Note   Patient: Deborah Jennings           Date of Birth: 1960/05/30           MRN: [id number] Visit Date: 10/19/2018              Requested by: Susy Frizzle, MD [address] [place],  [place] [address] PCP: Susy Frizzle, MD   Assessment & Plan: Visit Diagnoses:  1. Paronychia of right index finger     Plan: Impression is right index finger paronychia.  We will place her on a course of Augmentin for this.  Patient instructed on the signs and symptoms of worsening and for reasons to return to the office.  Otherwise we can see her back as needed.  Follow-Up Instructions: Return if symptoms worsen or fail to improve.   Orders:  Orders Placed This Encounter  Procedures  . XR Finger Index Right   Meds ordered this encounter  Medications  . DISCONTD: amoxicillin-clavulanate (AUGMENTIN) 875-125 MG tablet    Sig: Take 1 tablet by mouth 2 (two) times daily.    Dispense:  14 tablet    Refill:  0  . amoxicillin-clavulanate (AUGMENTIN) 875-125 MG tablet    Sig: Take 1 tablet by mouth 2 (two) times daily.    Dispense:  20 tablet    Refill:  0      Procedures: No procedures performed   Clinical Data: No additional findings.   Subjective: Chief Complaint  Patient presents with  . Right Index Finger - Pain    [name] comes in today for evaluation of acute right index finger paronychia.  She had 1 of her left index finger last year that was treated with evacuation in the office.  She denies any injuries or trauma.  She states that she has been working in the yard although not she cannot think of any reason why she would get 1.   Review of Systems  Constitutional: Negative.   HENT: Negative.   Eyes: Negative.   Respiratory: Negative.   Cardiovascular: Negative.   Endocrine: Negative.   Musculoskeletal: Negative.   Neurological: Negative.   Hematological: Negative.   Psychiatric/Behavioral: Negative.   All other systems reviewed and are  negative.    Objective: Vital Signs: Ht 5\' 5"  (1.651 m)   Wt 190 lb (86.2 kg)   BMI 31.62 kg/m   Physical Exam Vitals signs and nursing note reviewed.  Constitutional:      Appearance: She is well-developed.  Pulmonary:     Effort: Pulmonary effort is normal.  Skin:    General: Skin is warm.     Capillary Refill: Capillary refill takes less than 2 seconds.  Neurological:     Mental Status: She is alert and oriented to person, place, and time.  Psychiatric:        Behavior: Behavior normal.        Thought Content: Thought content normal.        Judgment: Judgment normal.     Ortho Exam Right index anger shows tenderness along the ulnar side of the left index finger paronychium.  There is no drainage.  There is a dark reddish discoloration around the paronychium.  The finger pulp is soft. Specialty Comments:  No specialty comments available.  Imaging: Xr Finger Index Right  Result Date: 10/19/2018 No bony erosions.  No evidence of acute or structural abnormalities.    PMFS History: Patient Active Problem List   Diagnosis  Date Noted  . Ganglion 05/26/2017  . Paronychia of right index finger 05/13/2017  . Avulsion fracture of right ankle 03/29/2014  . Right shoulder pain 12/05/2013  . Sprain of right ankle 12/05/2013  . Migraine   . Endometriosis   . IBS (irritable bowel syndrome)    Past Medical History:  Diagnosis Date  . ASCUS of cervix with negative high risk HPV 04/2017  . Asthma   . Endometriosis   . IBS (irritable bowel syndrome)   . Migraine     Family History  Problem Relation Age of Onset  . Diabetes Father   . Hypertension Father   . Hypertension Mother   . Dementia Mother     Past Surgical History:  Procedure Laterality Date  . ABDOMINAL HYSTERECTOMY     RSO  . CESAREAN SECTION    . DILATION AND CURETTAGE OF UTERUS    . lower back surgery     "[place]"  . OOPHORECTOMY     RSO  . PELVIC LAPAROSCOPY     Lysis of  adhesions-Endometriosis   Social History   Occupational History  . Not on file  Tobacco Use  . Smoking status: Former Research scientist (life sciences)  . Smokeless tobacco: Never Used  Substance and Sexual Activity  . Alcohol use: Yes    Alcohol/week: 5.0 standard drinks    Types: 5 Standard drinks or equivalent per week  . Drug use: Never  . Sexual activity: Yes    Birth control/protection: Surgical    Comment: 1st intercourse 57 yo-More than 5 partners

## 2018-11-01 ENCOUNTER — Encounter: Payer: Self-pay | Admitting: Gynecology

## 2018-12-01 ENCOUNTER — Ambulatory Visit: Payer: Managed Care, Other (non HMO) | Admitting: Family Medicine

## 2018-12-05 ENCOUNTER — Other Ambulatory Visit: Payer: Self-pay

## 2018-12-05 ENCOUNTER — Ambulatory Visit: Payer: Managed Care, Other (non HMO) | Admitting: Podiatry

## 2018-12-05 ENCOUNTER — Telehealth: Payer: Self-pay | Admitting: Podiatry

## 2018-12-05 ENCOUNTER — Ambulatory Visit (INDEPENDENT_AMBULATORY_CARE_PROVIDER_SITE_OTHER): Payer: BC Managed Care – PPO

## 2018-12-05 ENCOUNTER — Encounter: Payer: Self-pay | Admitting: Podiatry

## 2018-12-05 ENCOUNTER — Telehealth: Payer: Self-pay | Admitting: *Deleted

## 2018-12-05 DIAGNOSIS — R0989 Other specified symptoms and signs involving the circulatory and respiratory systems: Secondary | ICD-10-CM

## 2018-12-05 DIAGNOSIS — S92505A Nondisplaced unspecified fracture of left lesser toe(s), initial encounter for closed fracture: Secondary | ICD-10-CM | POA: Diagnosis not present

## 2018-12-05 DIAGNOSIS — M2042 Other hammer toe(s) (acquired), left foot: Secondary | ICD-10-CM | POA: Diagnosis not present

## 2018-12-05 DIAGNOSIS — M792 Neuralgia and neuritis, unspecified: Secondary | ICD-10-CM | POA: Diagnosis not present

## 2018-12-05 MED ORDER — MELOXICAM 15 MG PO TABS: 15.0000 mg | ORAL_TABLET | Freq: Every day | ORAL | 0 refills | Status: DC

## 2018-12-05 MED ORDER — TRAMADOL HCL 50 MG PO TABS: 50.0000 mg | ORAL_TABLET | Freq: Three times a day (TID) | ORAL | 0 refills | Status: AC | PRN

## 2018-12-05 NOTE — Telephone Encounter (Signed)
Pt called states Dr. Jacqualyn Posey was to call in an antiinflammatory medication and a pain medication, the meloxicam is the only medication at the pharmacy.

## 2018-12-05 NOTE — Progress Notes (Signed)
Subjective: 58 year old female presents the office today for concerns of left foot pain.  She states that 2 months ago she hit her left fifth toe breaking the toe it is evident she had on the corner of the bed.  She states that she is had some pain since then however last week she states that her entire left foot went numb and she fell injuring her right knee and leg.  She has a follow-up with her orthopedic doctor tomorrow for the right knee but she is concerned about the numbness to her left foot.  She also reports that she has had 2 back surgeries and she had increased back pain recently.Denies any systemic complaints such as fevers, chills, nausea, vomiting. No acute changes since last appointment, and no other complaints at this time.   Objective: AAO x3, NAD DP pulses 2/4, PT pulses 1/4.  CRT less than 3 seconds.  There does appear to be color change of the left versus the right leg.  There is bruising of the right leg in the which is likely causing the color change.  Normal temperature gradient bilaterally. There is tenderness to the fifth digit itself in the left fifth MPJ there is no other areas of pinpoint tenderness.  Mild swelling to the dorsal aspect the left foot.  Significant bunion deformities present in the left foot. No pain with calf compression, swelling, warmth, erythema  Assessment: Left fifth toe fracture, neuritis  Plan: -All treatment options discussed with the patient including all alternatives, risks, complications.  -X-rays obtained reviewed.  Subacute fracture present of the fifth digit.  Arthritic changes present the midfoot. -Given her pain with immobilized in a surgical shoe was dispensed today.  Encouraged ice/elevation. Prescribed mobic. Discussed side effects of the medication and directed to stop if any are to occur and call the office.  -I would order nerve conduction test given the numbness to her left foot.  Also encouraged her to follow-up with a  neurosurgeon. -ABI was done in the office.  1.14 in the left and 1.16 on the right.  Trula Slade DPM

## 2018-12-05 NOTE — Addendum Note (Signed)
Addended by: Celesta Gentile R on: 12/05/2018 05:20 PM   Modules accepted: Orders

## 2018-12-05 NOTE — Telephone Encounter (Signed)
Pt was seen in office today and was supposed to have Meloxicam and a pain medication called into her pharmacy but when she checked they had only received the Meloxicam.  Pt calling to follow up on the pain medication.

## 2018-12-06 ENCOUNTER — Ambulatory Visit: Payer: Managed Care, Other (non HMO) | Admitting: Orthopaedic Surgery

## 2018-12-06 ENCOUNTER — Ambulatory Visit (INDEPENDENT_AMBULATORY_CARE_PROVIDER_SITE_OTHER): Payer: Self-pay

## 2018-12-06 ENCOUNTER — Encounter: Payer: Self-pay | Admitting: Orthopaedic Surgery

## 2018-12-06 DIAGNOSIS — M25561 Pain in right knee: Secondary | ICD-10-CM

## 2018-12-06 MED ORDER — SULFAMETHOXAZOLE-TRIMETHOPRIM 800-160 MG PO TABS: 1.0000 | ORAL_TABLET | Freq: Two times a day (BID) | ORAL | 0 refills | Status: DC

## 2018-12-06 MED ORDER — LIDOCAINE HCL 1 % IJ SOLN
2.0000 mL | INTRAMUSCULAR | Status: AC | PRN
  Administered 2018-12-06: 14:00:00 2 mL

## 2018-12-06 MED ORDER — MUPIROCIN 2 % EX OINT: 1.0000 "application " | TOPICAL_OINTMENT | Freq: Two times a day (BID) | CUTANEOUS | 0 refills | Status: DC

## 2018-12-06 MED ORDER — BUPIVACAINE HCL 0.25 % IJ SOLN
2.0000 mL | INTRAMUSCULAR | Status: AC | PRN
  Administered 2018-12-06: 2 mL via INTRA_ARTICULAR

## 2018-12-06 NOTE — Progress Notes (Signed)
Office Visit Note   Patient: Deborah Jennings           Date of Birth: 23-Jul-1960           MRN: [id number] Visit Date: 12/06/2018              Requested by: Susy Frizzle, MD [address] [place],  [place] [address] PCP: Susy Frizzle, MD   Assessment & Plan: Visit Diagnoses:  1. Acute pain of right knee     Plan: Impression is right knee contusion with superficial abrasion.  We attempted aspiration but were unsuccessful as I believe the majority of her swelling is extra-articular edema.  We have applied mupirocin ointment as well as a compression bandage to the knee today.  She will continue to do this twice daily as well as ice and elevate as much as possible.  We will start her on an antibiotic as well.  She is allergic to Keflex is called in Bactrim.  Follow-up with [place] in 2 weeks time for recheck.  Call with concerns or questions in the meantime.  Follow-Up Instructions: Return in about 2 weeks (around 12/20/2018).   Orders:  Orders Placed This Encounter  Procedures  . Large Joint Inj: R knee  . XR KNEE 3 VIEW RIGHT   Meds ordered this encounter  Medications  . mupirocin ointment (BACTROBAN) 2 %    Sig: Place 1 application into the nose 2 (two) times daily. Apply twice daily to the knee    Dispense:  22 g    Refill:  0  . sulfamethoxazole-trimethoprim (BACTRIM DS) 800-160 MG tablet    Sig: Take 1 tablet by mouth 2 (two) times daily.    Dispense:  20 tablet    Refill:  0      Procedures: Large Joint Inj: R knee on 12/06/2018 2:04 PM Indications: pain Details: 22 G needle, anterolateral approach Medications: 2 mL bupivacaine 0.25 %; 2 mL lidocaine 1 %      Clinical Data: No additional findings.   Subjective: Chief Complaint  Patient presents with  . Right Knee - Pain    HPI patient is a pleasant 58 year old female who presents our clinic today with right knee pain.  Last Saturday, 11/27/2018, she was getting out of a golf cart when her left  foot went numb causing her to fall onto the anterior aspect of her right knee.  She had immediate swelling.  She has had increased pain with bearing weight as well as with flexion of the knee.  She has been using Neosporin over the superficial abrasion which she sustained during the fall.  She has been taking over-the-counter NSAIDs as well as tramadol with mild relief of symptoms.  No fevers or chills.  Review of Systems as detailed in HPI.  All others reviewed and are negative.   Objective: Vital Signs: There were no vitals taken for this visit.  Physical Exam well-developed well-nourished female no acute distress.  Alert and oriented x3.  Ortho Exam examination of the right knee reveals a 1+ effusion.  She does have a moderate prepatellar effusion as well.  Increased pain to the patella.  She has apprehension with this as well.  Range of motion 0 to 90 degrees.  She is able to straight leg raise.  Cruciates and collaterals are stable.  She does have mild medial lateral joint line tenderness.  She is neurovascularly intact distally.  She does have moderate sized superficial abrasion with  associated erythema and fibrinous exudate.  No cellulitis.  Specialty Comments:  No specialty comments available.  Imaging: Xr Knee 3 View Right  Result Date: 12/06/2018 X-rays demonstrate marked patellofemoral joint space narrowing, otherwise no acute findings    PMFS History: Patient Active Problem List   Diagnosis Date Noted  . Ganglion 05/26/2017  . Paronychia of right index finger 05/13/2017  . Avulsion fracture of right ankle 03/29/2014  . Right shoulder pain 12/05/2013  . Sprain of right ankle 12/05/2013  . Migraine   . Endometriosis   . IBS (irritable bowel syndrome)    Past Medical History:  Diagnosis Date  . ASCUS of cervix with negative high risk HPV 04/2017  . Asthma   . Endometriosis   . IBS (irritable bowel syndrome)   . Migraine     Family History  Problem Relation Age of  Onset  . Diabetes Father   . Hypertension Father   . Hypertension Mother   . Dementia Mother     Past Surgical History:  Procedure Laterality Date  . ABDOMINAL HYSTERECTOMY     RSO  . CESAREAN SECTION    . DILATION AND CURETTAGE OF UTERUS    . lower back surgery     "[place]"  . OOPHORECTOMY     RSO  . PELVIC LAPAROSCOPY     Lysis of adhesions-Endometriosis   Social History   Occupational History  . Not on file  Tobacco Use  . Smoking status: Former Research scientist (life sciences)  . Smokeless tobacco: Never Used  Substance and Sexual Activity  . Alcohol use: Yes    Alcohol/week: 5.0 standard drinks    Types: 5 Standard drinks or equivalent per week  . Drug use: Never  . Sexual activity: Yes    Birth control/protection: Surgical    Comment: 1st intercourse 58 yo-More than 5 partners

## 2018-12-09 ENCOUNTER — Other Ambulatory Visit: Payer: Self-pay | Admitting: Physician Assistant

## 2018-12-09 ENCOUNTER — Telehealth: Payer: Self-pay | Admitting: Physician Assistant

## 2018-12-09 MED ORDER — HYDROCODONE-ACETAMINOPHEN 5-325 MG PO TABS: 1.0000 | ORAL_TABLET | Freq: Four times a day (QID) | ORAL | 0 refills | Status: DC | PRN

## 2018-12-09 NOTE — Telephone Encounter (Signed)
Spoke to patient

## 2018-12-09 NOTE — Telephone Encounter (Signed)
Patient called advised the Tramadol is not really helping with the pain and asked if Ria Comment would call her before prescribing any other pain medicine for her? The number to contact patient is [phone]

## 2018-12-09 NOTE — Telephone Encounter (Signed)
See below

## 2018-12-20 ENCOUNTER — Other Ambulatory Visit: Payer: Self-pay

## 2018-12-20 ENCOUNTER — Encounter: Payer: Self-pay | Admitting: Orthopaedic Surgery

## 2018-12-20 ENCOUNTER — Ambulatory Visit (INDEPENDENT_AMBULATORY_CARE_PROVIDER_SITE_OTHER): Payer: BC Managed Care – PPO | Admitting: Orthopaedic Surgery

## 2018-12-20 DIAGNOSIS — M25561 Pain in right knee: Secondary | ICD-10-CM | POA: Diagnosis not present

## 2018-12-20 NOTE — Progress Notes (Signed)
   Office Visit Note   Patient: Deborah Jennings           Date of Birth: 04-Feb-1960           MRN: [id number] Visit Date: 12/20/2018              Requested by: Susy Frizzle, MD [address] [place],  [place] [address] PCP: Susy Frizzle, MD   Assessment & Plan: Visit Diagnoses:  1. Acute pain of right knee     Plan: Impression is improving right knee contusion and abrasion with prepatellar effusion.  Based on discussion today she would like to continue to give this more time with compression, ice, heat, symptomatic treatment.  Questions encouraged and answered.  Follow-up as needed.  Follow-Up Instructions: Return if symptoms worsen or fail to improve.   Orders:  No orders of the defined types were placed in this encounter.  No orders of the defined types were placed in this encounter.     Procedures: No procedures performed   Clinical Data: No additional findings.   Subjective: Chief Complaint  Patient presents with  . Right Knee - Follow-up, Pain    Patient returns today for recheck of her right knee contusion and abrasion.  She is overall doing much better.   Review of Systems   Objective: Vital Signs: There were no vitals taken for this visit.  Physical Exam  Ortho Exam Right knee exam shows moderate prepatellar effusion.  No signs of infection.  The abrasion has improved significantly. Specialty Comments:  No specialty comments available.  Imaging: No results found.   PMFS History: Patient Active Problem List   Diagnosis Date Noted  . Acute pain of right knee 12/20/2018  . Ganglion 05/26/2017  . Paronychia of right index finger 05/13/2017  . Avulsion fracture of right ankle 03/29/2014  . Right shoulder pain 12/05/2013  . Sprain of right ankle 12/05/2013  . Migraine   . Endometriosis   . IBS (irritable bowel syndrome)    Past Medical History:  Diagnosis Date  . ASCUS of cervix with negative high risk HPV 04/2017  . Asthma    . Endometriosis   . IBS (irritable bowel syndrome)   . Migraine     Family History  Problem Relation Age of Onset  . Diabetes Father   . Hypertension Father   . Hypertension Mother   . Dementia Mother     Past Surgical History:  Procedure Laterality Date  . ABDOMINAL HYSTERECTOMY     RSO  . CESAREAN SECTION    . DILATION AND CURETTAGE OF UTERUS    . lower back surgery     "[place]"  . OOPHORECTOMY     RSO  . PELVIC LAPAROSCOPY     Lysis of adhesions-Endometriosis   Social History   Occupational History  . Not on file  Tobacco Use  . Smoking status: Former Research scientist (life sciences)  . Smokeless tobacco: Never Used  Substance and Sexual Activity  . Alcohol use: Yes    Alcohol/week: 5.0 standard drinks    Types: 5 Standard drinks or equivalent per week  . Drug use: Never  . Sexual activity: Yes    Birth control/protection: Surgical    Comment: 1st intercourse 58 yo-More than 5 partners

## 2018-12-26 ENCOUNTER — Ambulatory Visit: Payer: Managed Care, Other (non HMO) | Admitting: Podiatry

## 2019-01-04 ENCOUNTER — Other Ambulatory Visit: Payer: Self-pay | Admitting: Family Medicine

## 2019-01-12 ENCOUNTER — Ambulatory Visit: Payer: BC Managed Care – PPO | Admitting: Podiatry

## 2019-01-12 ENCOUNTER — Encounter: Payer: Self-pay | Admitting: Podiatry

## 2019-01-12 ENCOUNTER — Telehealth: Payer: Self-pay | Admitting: *Deleted

## 2019-01-12 ENCOUNTER — Other Ambulatory Visit: Payer: Self-pay

## 2019-01-12 DIAGNOSIS — M792 Neuralgia and neuritis, unspecified: Secondary | ICD-10-CM

## 2019-01-12 DIAGNOSIS — S92505D Nondisplaced unspecified fracture of left lesser toe(s), subsequent encounter for fracture with routine healing: Secondary | ICD-10-CM | POA: Diagnosis not present

## 2019-01-12 NOTE — Telephone Encounter (Signed)
Prepared orders, demographics to be faxed to [facility] once 01/12/2019 clinicals are available.

## 2019-01-12 NOTE — Telephone Encounter (Signed)
-----   Message from Trula Slade, DPM sent at 01/12/2019 10:55 AM EST ----- Can you please order a NCV? Thanks.

## 2019-01-16 NOTE — Progress Notes (Signed)
Subjective: 58 year old female presents the office today for follow-up evaluation left fifth toe fracture as well as numbness to her feet.  She states that she still gets numbness to her left foot as well as in the fourth and fifth toes.  Denies any swelling.  She does have a history of 2 back surgeries and she does report increased back pain recently.  She has not followed up with her neurosurgeon. Denies any systemic complaints such as fevers, chills, nausea, vomiting. No acute changes since last appointment, and no other complaints at this time.   Objective: AAO x3, NAD DP pulses 2/4, PT pulses 1/4.  CRT less than 3 seconds.  Color appears to be equal bilaterally.  There is no pain the left fifth toe but she still describing numbness to her left foot.  There is no weakness or falls recently. No pain with calf compression, swelling, warmth, erythema  Assessment: Left fifth toe fracture with resolved pain, neuritis  Plan: -All treatment options discussed with the patient including all alternatives, risks, complications.  -I did send an order nerve conduction test given the numbness to her left foot.  Continue with supportive shoes as well.  The fifth toe appears to be doing well and she we will hold off on x-rays and she is having no pain or swelling we will hold off.  Return for left foot pain after nerve conduction test .  Trula Slade DPM

## 2019-01-16 NOTE — Telephone Encounter (Signed)
done

## 2019-01-17 NOTE — Telephone Encounter (Signed)
Faxed required form, 01/12/2019 clinicals and demographics to [facility].

## 2019-01-21 ENCOUNTER — Other Ambulatory Visit: Payer: Self-pay | Admitting: Podiatry

## 2019-01-24 ENCOUNTER — Encounter: Payer: Self-pay | Admitting: Neurology

## 2019-01-25 ENCOUNTER — Other Ambulatory Visit: Payer: Self-pay

## 2019-01-25 DIAGNOSIS — M792 Neuralgia and neuritis, unspecified: Secondary | ICD-10-CM

## 2019-02-14 ENCOUNTER — Encounter: Payer: BC Managed Care – PPO | Admitting: Neurology

## 2019-04-25 ENCOUNTER — Other Ambulatory Visit: Payer: Self-pay

## 2019-04-25 ENCOUNTER — Ambulatory Visit (INDEPENDENT_AMBULATORY_CARE_PROVIDER_SITE_OTHER): Payer: BC Managed Care – PPO | Admitting: Nurse Practitioner

## 2019-04-25 ENCOUNTER — Other Ambulatory Visit: Payer: Self-pay | Admitting: Orthopaedic Surgery

## 2019-04-25 DIAGNOSIS — J069 Acute upper respiratory infection, unspecified: Secondary | ICD-10-CM | POA: Diagnosis not present

## 2019-04-25 DIAGNOSIS — J45901 Unspecified asthma with (acute) exacerbation: Secondary | ICD-10-CM

## 2019-04-25 MED ORDER — PREDNISONE 10 MG PO TABS: ORAL_TABLET | ORAL | 0 refills | Status: DC

## 2019-04-25 MED ORDER — HYDROCODONE-HOMATROPINE 5-1.5 MG/5ML PO SYRP: 5.0000 mL | ORAL_SOLUTION | Freq: Three times a day (TID) | ORAL | 0 refills | Status: DC | PRN

## 2019-04-25 MED ORDER — DOXYCYCLINE HYCLATE 100 MG PO TABS: 100.0000 mg | ORAL_TABLET | Freq: Two times a day (BID) | ORAL | 0 refills | Status: DC

## 2019-04-25 NOTE — Progress Notes (Signed)
Telephone Visit  Subjective:    Patient ID: Deborah Jennings, female    DOB: 10-Oct-1960, 59 y.o.   MRN: [id number]  Virtual Visit via Telephone Note    I connected with Deborah Jennings on 04/25/2019 at 2:40PM by telephoneand verified that I am speaking with the correct person using two identifiers.  Pt location: at home   Physician location:  In office, [place], Ishmael Holter, FNP-C    On call: patient and physician   I discussed the limitations, risks, security and privacy concerns of performing an evaluation and management service by telephone and the availability of in person appointments. I also discussed with the patient that there may be a patient responsible charge related to this service. The patient expressed understanding and agreed to proceed.  History of Present Illness: Pt is a 59 year old female presenting by telephone for sxs that started 3 weeks that started with what she describes as asthma exacerbation, now feeling tight to breath in the chest that is relived temporarily with nebulizer treatment, a cough that has worsened with tickle in throat, worse when lying down. She has also used cough drops, dylsum over the counter, salt water gargles. Wheezing at times relieved with nebulizer as well. Ventolin nebulizer using twice per day. Phlegm in nose is now yellow.  Denied fever/chills. No sinus pressure. No cp/ct, gu/gi sxs, loss of taste or smell, sob now, edema.     Past Medical History:  Diagnosis Date  . ASCUS of cervix with negative high risk HPV 04/2017  . Asthma   . Endometriosis   . IBS (irritable bowel syndrome)   . Migraine     Past Surgical History:  Procedure Laterality Date  . ABDOMINAL HYSTERECTOMY     RSO  . CESAREAN SECTION    . DILATION AND CURETTAGE OF UTERUS    . lower back surgery     "[place]"  . OOPHORECTOMY     RSO  . PELVIC LAPAROSCOPY     Lysis of adhesions-Endometriosis    Family History    Problem Relation Age of Onset  . Diabetes Father   . Hypertension Father   . Hypertension Mother   . Dementia Mother     Social History   Socioeconomic History  . Marital status: Married    Spouse name: Not on file  . Number of children: Not on file  . Years of education: Not on file  . Highest education level: Not on file  Occupational History  . Not on file  Tobacco Use  . Smoking status: Former Research scientist (life sciences)  . Smokeless tobacco: Never Used  Substance and Sexual Activity  . Alcohol use: Yes    Alcohol/week: 5.0 standard drinks    Types: 5 Standard drinks or equivalent per week  . Drug use: Never  . Sexual activity: Yes    Birth control/protection: Surgical    Comment: 1st intercourse 59 yo-More than 5 partners  Other Topics Concern  . Not on file  Social History Narrative  . Not on file   Social Determinants of Health   Financial Resource Strain:   . Difficulty of Paying Living Expenses:   Food Insecurity:   . Worried About Charity fundraiser in the Last Year:   . Arboriculturist in the Last Year:   Transportation Needs:   . Film/video editor (Medical):   Marland Kitchen Lack of Transportation (Non-Medical):   Physical Activity:   . Days of  Exercise per Week:   . Minutes of Exercise per Session:   Stress:   . Feeling of Stress :   Social Connections:   . Frequency of Communication with Friends and Family:   . Frequency of Social Gatherings with Friends and Family:   . Attends Religious Services:   . Active Member of Clubs or Organizations:   . Attends Archivist Meetings:   Marland Kitchen Marital Status:   Intimate Partner Violence:   . Fear of Current or Ex-Partner:   . Emotionally Abused:   Marland Kitchen Physically Abused:   . Sexually Abused:     Allergies  Allergen Reactions  . Ampicillin Swelling    "PCN is fine"    Health Maintenance Due  Topic Date Due  . Hepatitis C Screening  Never done  . TETANUS/TDAP  Never done  . COLONOSCOPY  Never done  . INFLUENZA  VACCINE  Never done  . MAMMOGRAM  10/22/2018    Lab Results  Component Value Date   TSH 1.98 04/23/2016   Lab Results  Component Value Date   WBC 8.9 04/23/2016   HGB 14.2 04/23/2016   HCT 41.8 04/23/2016   MCV 91.5 04/23/2016   PLT 216 04/23/2016   Lab Results  Component Value Date   NA 142 04/23/2016   K 4.7 04/23/2016   CO2 26 04/23/2016   GLUCOSE 104 (H) 04/23/2016   BUN 18 04/23/2016   CREATININE 0.65 04/23/2016   BILITOT 0.4 04/23/2016   ALKPHOS 67 04/23/2016   AST 25 04/23/2016   ALT 26 04/23/2016   PROT 6.4 04/23/2016   ALBUMIN 4.1 04/23/2016   CALCIUM 8.9 04/23/2016   Lab Results  Component Value Date   CHOL 241 (H) 04/23/2016   Lab Results  Component Value Date   HDL 77 04/23/2016   Lab Results  Component Value Date   LDLCALC 119 (H) 04/23/2016   Lab Results  Component Value Date   TRIG 223 (H) 04/23/2016   Lab Results  Component Value Date   CHOLHDL 3.1 04/23/2016   Observations/Objective: NAD noted, able to speak in full sentences, no cough or wheezing heard while on call. Pt alert and oriented x3.  Assessment and Plan: Your symptoms are most consistent with Asthma exacerbation with URI. Treat with prednisone, antibiotic, continued Nebulizer's, and cough relief medication.  Complete COVID test  Go to ER for shortness of breath  Follow Up Instructions: as needed for non resolving or worsening sxs.  I discussed the assessment and treatment plan with the patient. The patient was provided an opportunity to ask questions and all were answered. The patient agreed with the plan and demonstrated an understanding of the instructions.  The patient was advised to call back or seek an in-person evaluation if the symptoms worsen or if the condition fails to improve as anticipated.  I provided 15  minutes of non-face-to-face time during this encounter. End Time 2:55PM   Ishmael Holter, FNP-C

## 2019-04-26 ENCOUNTER — Ambulatory Visit: Payer: BC Managed Care – PPO | Attending: Internal Medicine

## 2019-04-26 DIAGNOSIS — Z20822 Contact with and (suspected) exposure to covid-19: Secondary | ICD-10-CM

## 2019-04-27 LAB — NOVEL CORONAVIRUS, NAA: SARS-CoV-2, NAA: NOT DETECTED

## 2019-04-27 LAB — SARS-COV-2, NAA 2 DAY TAT

## 2019-05-04 ENCOUNTER — Other Ambulatory Visit: Payer: Self-pay | Admitting: Family Medicine

## 2019-05-04 ENCOUNTER — Ambulatory Visit (INDEPENDENT_AMBULATORY_CARE_PROVIDER_SITE_OTHER): Payer: BC Managed Care – PPO | Admitting: Family Medicine

## 2019-05-04 DIAGNOSIS — F418 Other specified anxiety disorders: Secondary | ICD-10-CM | POA: Diagnosis not present

## 2019-05-04 MED ORDER — ALPRAZOLAM 1 MG PO TABS: ORAL_TABLET | ORAL | 1 refills | Status: DC

## 2019-05-04 NOTE — Progress Notes (Signed)
Subjective:    Patient ID: Deborah Jennings, female    DOB: 04-05-1960, 59 y.o.   MRN: [id number]  HPI Patient is a 59 year old Caucasian female being seen today as a telephone visit.  Phone call began at 922.  Phone call concluded at 937.  She consents to be seen via telephone.  Initially this was supposed to be a video visit however the patient was unable to get the program to work properly on her computer.  She is requesting a refill on her Xanax.  She is very tearful today over the telephone.  She states that she is been having occasional panic attacks.  She will feel very short of breath.  She will feel very jittery.  She would feel her heart racing.  This is usually brought on by stress.  She has been under tremendous stress recently.  She states that her brother has accused her of abusing her power of attorney.  He has accused her of allowing her mother to fall and neglecting her mother.  This is been over her court case.  He is suing to get the power of attorney.  Patient states that he is trying to get access to the mother's financial assets in her home.  She states that this has caused her mother to go downhill quickly in regards to her dementia.  She states that the brother has only seen the patient's mother 3 times in the last 3 years yet he accuses her of neglecting her mother.  In my interaction with the patient she has always been very caring for her mother.  I have seen no evidence of neglect.  However the accusations have weighed heavily on the patient's conscious.  She has had to go to court and testify.  Therefore the situation has exacerbated her Xanax.  She also has been recently diagnosed with walking pneumonia and is having to use her albuterol occasionally which also has her feeling extremely anxious and jittery.. Past Medical History:  Diagnosis Date  . ASCUS of cervix with negative high risk HPV 04/2017  . Asthma   . Endometriosis   . IBS (irritable bowel syndrome)   . Migraine     Past Surgical History:  Procedure Laterality Date  . ABDOMINAL HYSTERECTOMY     RSO  . CESAREAN SECTION    . DILATION AND CURETTAGE OF UTERUS    . lower back surgery     "[place]"  . OOPHORECTOMY     RSO  . PELVIC LAPAROSCOPY     Lysis of adhesions-Endometriosis   Current Outpatient Medications on File Prior to Visit  Medication Sig Dispense Refill  . albuterol (VENTOLIN HFA) 108 (90 Base) MCG/ACT inhaler INHALE 2 PUFFS INTO THE LUNGS EVERY 6 HOURS AS NEEDED FOR WHEEZE OR SHORTNESS OF BREATH 8.5 g 11  . estradiol (VIVELLE-DOT) 0.075 MG/24HR PLACE 1 PATCH ONTO THE SKIN 2 (TWO) TIMES A WEEK. 24 patch 3  . hyoscyamine (LEVBID) 0.375 MG 12 hr tablet Take 1 tablet (0.375 mg total) by mouth 2 (two) times daily. Requires office visit before any further refills can be given. 180 tablet 1  . meloxicam (MOBIC) 15 MG tablet TAKE 1 TABLET BY MOUTH EVERY DAY 30 tablet 0  . Multiple Vitamin (MULTIVITAMIN) tablet Take 1 tablet by mouth daily.    . mupirocin ointment (BACTROBAN) 2 % Place 1 application into the nose 2 (two) times daily. Apply twice daily to the knee 22 g 0  . naproxen (NAPROSYN)  500 MG tablet TAKE 1 TABLET BY MOUTH 2 TIMES DAILY WITH A MEAL 30 tablet 6  . promethazine (PHENERGAN) 25 MG tablet Take 1 tablet (25 mg total) by mouth every 8 (eight) hours as needed for nausea or vomiting. 20 tablet 0  . traMADol (ULTRAM) 50 MG tablet Take 1-2 tablets (50-100 mg total) by mouth 3 (three) times daily as needed. 30 tablet 2   No current facility-administered medications on file prior to visit.   Allergies  Allergen Reactions  . Ampicillin Swelling    "PCN is fine"   Social History   Socioeconomic History  . Marital status: Married    Spouse name: Not on file  . Number of children: Not on file  . Years of education: Not on file  . Highest education level: Not on file  Occupational History  . Not on file  Tobacco Use  . Smoking status: Former Research scientist (life sciences)  . Smokeless  tobacco: Never Used  Substance and Sexual Activity  . Alcohol use: Yes    Alcohol/week: 5.0 standard drinks    Types: 5 Standard drinks or equivalent per week  . Drug use: Never  . Sexual activity: Yes    Birth control/protection: Surgical    Comment: 1st intercourse 59 yo-More than 5 partners  Other Topics Concern  . Not on file  Social History Narrative  . Not on file   Social Determinants of Health   Financial Resource Strain:   . Difficulty of Paying Living Expenses:   Food Insecurity:   . Worried About Charity fundraiser in the Last Year:   . Arboriculturist in the Last Year:   Transportation Needs:   . Film/video editor (Medical):   Marland Kitchen Lack of Transportation (Non-Medical):   Physical Activity:   . Days of Exercise per Week:   . Minutes of Exercise per Session:   Stress:   . Feeling of Stress :   Social Connections:   . Frequency of Communication with Friends and Family:   . Frequency of Social Gatherings with Friends and Family:   . Attends Religious Services:   . Active Member of Clubs or Organizations:   . Attends Archivist Meetings:   Marland Kitchen Marital Status:   Intimate Partner Violence:   . Fear of Current or Ex-Partner:   . Emotionally Abused:   Marland Kitchen Physically Abused:   . Sexually Abused:       Review of Systems  All other systems reviewed and are negative.      Objective:   Physical Exam        Assessment & Plan:  Situational anxiety  My heart goes out to the patient.  I would be glad to refill her Xanax.  She can take 1 mg every 8 hours as needed.  I cautioned the patient about the risk of dependency and habituation.  I would like her to try to use it less than once a day on average.  She states that this would not be a problem.  She is very fearful for becoming dependent on this medication and plans to use it sparingly.

## 2019-08-08 ENCOUNTER — Encounter: Payer: Self-pay | Admitting: Family Medicine

## 2019-09-01 ENCOUNTER — Ambulatory Visit (INDEPENDENT_AMBULATORY_CARE_PROVIDER_SITE_OTHER): Payer: BC Managed Care – PPO | Admitting: Family Medicine

## 2019-09-01 ENCOUNTER — Other Ambulatory Visit: Payer: Self-pay

## 2019-09-01 VITALS — BP 140/90 | HR 87 | Temp 94.3°F | Ht 65.0 in | Wt 222.0 lb

## 2019-09-01 DIAGNOSIS — B001 Herpesviral vesicular dermatitis: Secondary | ICD-10-CM

## 2019-09-01 DIAGNOSIS — M5442 Lumbago with sciatica, left side: Secondary | ICD-10-CM | POA: Diagnosis not present

## 2019-09-01 DIAGNOSIS — Z1211 Encounter for screening for malignant neoplasm of colon: Secondary | ICD-10-CM | POA: Diagnosis not present

## 2019-09-01 DIAGNOSIS — R208 Other disturbances of skin sensation: Secondary | ICD-10-CM | POA: Diagnosis not present

## 2019-09-01 DIAGNOSIS — G8929 Other chronic pain: Secondary | ICD-10-CM | POA: Diagnosis not present

## 2019-09-01 MED ORDER — VALACYCLOVIR HCL 500 MG PO TABS: 500.0000 mg | ORAL_TABLET | Freq: Every day | ORAL | 11 refills | Status: DC

## 2019-09-01 NOTE — Progress Notes (Signed)
Subjective:    Patient ID: Deborah Jennings, female    DOB: [birth year], 59 y.o.   MRN: [id number]  HPI  Patient is a very pleasant 59 year old Caucasian female who is here today for cold sores.  She is under tremendous amount of stress and anxiety recently.  There is a legal battle with her brother over the care of her mother who has severe dementia.  This is documented in previous office visits.  This is causing tremendous family stress and emotional stress.  As a result, she has been having frequent cold sores.  However she is having them in the distribution of the left trigeminal nerve.  For instance today she has vesicles above her left eyebrow and on her left maxilla and right above her left upper lip.  They are 2 to 3 mm yellow vesicles on an erythematous base consistent with a herpetiform virus.  This would raise the concern about possible shingles as well however this is the second or third outbreak in the last month.  She also has a history of cold sores.  She is interested in medicine to try to suppress the outbreaks.  She also needs a colonoscopy to screen for colon cancer as she is long overdue.  She saw gastroenterologist decades ago and was diagnosed with IBS however she has not seen them in decades and has no recollection of who they were.  She would like to establish with a new gastroenterologist in this area. Past Medical History:  Diagnosis Date  . ASCUS of cervix with negative high risk HPV 04/2017  . Asthma   . Endometriosis   . IBS (irritable bowel syndrome)   . Migraine    Past Surgical History:  Procedure Laterality Date  . ABDOMINAL HYSTERECTOMY     RSO  . CESAREAN SECTION    . DILATION AND CURETTAGE OF UTERUS    . lower back surgery     "[place]"  . OOPHORECTOMY     RSO  . PELVIC LAPAROSCOPY     Lysis of adhesions-Endometriosis   Current Outpatient Medications on File Prior to Visit  Medication Sig Dispense Refill  . albuterol (VENTOLIN HFA) 108  (90 Base) MCG/ACT inhaler INHALE 2 PUFFS INTO THE LUNGS EVERY 6 HOURS AS NEEDED FOR WHEEZE OR SHORTNESS OF BREATH 8.5 g 11  . ALPRAZolam (XANAX) 1 MG tablet TAKE 1 TABLET BY MOUTH 3 TIMES A DAY AS NEEDED FOR ANXIETY 30 tablet 1  . estradiol (VIVELLE-DOT) 0.075 MG/24HR PLACE 1 PATCH ONTO THE SKIN 2 (TWO) TIMES A WEEK. 24 patch 3  . hyoscyamine (LEVBID) 0.375 MG 12 hr tablet Take 1 tablet (0.375 mg total) by mouth 2 (two) times daily. Requires office visit before any further refills can be given. 180 tablet 1  . meloxicam (MOBIC) 15 MG tablet TAKE 1 TABLET BY MOUTH EVERY DAY 30 tablet 0  . Multiple Vitamin (MULTIVITAMIN) tablet Take 1 tablet by mouth daily.    . mupirocin ointment (BACTROBAN) 2 % Place 1 application into the nose 2 (two) times daily. Apply twice daily to the knee 22 g 0  . naproxen (NAPROSYN) 500 MG tablet TAKE 1 TABLET BY MOUTH 2 TIMES DAILY WITH A MEAL 30 tablet 6  . promethazine (PHENERGAN) 25 MG tablet Take 1 tablet (25 mg total) by mouth every 8 (eight) hours as needed for nausea or vomiting. 20 tablet 0  . traMADol (ULTRAM) 50 MG tablet Take 1-2 tablets (50-100 mg total) by mouth 3 (three)  times daily as needed. 30 tablet 2   No current facility-administered medications on file prior to visit.   Allergies  Allergen Reactions  . Ampicillin Swelling    "PCN is fine"   Social History   Socioeconomic History  . Marital status: Married    Spouse name: Not on file  . Number of children: Not on file  . Years of education: Not on file  . Highest education level: Not on file  Occupational History  . Not on file  Tobacco Use  . Smoking status: Former Research scientist (life sciences)  . Smokeless tobacco: Never Used  Vaping Use  . Vaping Use: Never used  Substance and Sexual Activity  . Alcohol use: Yes    Alcohol/week: 5.0 standard drinks    Types: 5 Standard drinks or equivalent per week  . Drug use: Never  . Sexual activity: Yes    Birth control/protection: Surgical    Comment: 1st  intercourse 60 yo-More than 5 partners  Other Topics Concern  . Not on file  Social History Narrative  . Not on file   Social Determinants of Health   Financial Resource Strain:   . Difficulty of Paying Living Expenses:   Food Insecurity:   . Worried About Charity fundraiser in the Last Year:   . Arboriculturist in the Last Year:   Transportation Needs:   . Film/video editor (Medical):   Marland Kitchen Lack of Transportation (Non-Medical):   Physical Activity:   . Days of Exercise per Week:   . Minutes of Exercise per Session:   Stress:   . Feeling of Stress :   Social Connections:   . Frequency of Communication with Friends and Family:   . Frequency of Social Gatherings with Friends and Family:   . Attends Religious Services:   . Active Member of Clubs or Organizations:   . Attends Archivist Meetings:   Marland Kitchen Marital Status:   Intimate Partner Violence:   . Fear of Current or Ex-Partner:   . Emotionally Abused:   Marland Kitchen Physically Abused:   . Sexually Abused:      Review of Systems  All other systems reviewed and are negative.      Objective:   Physical Exam Vitals reviewed.  Constitutional:      General: She is not in acute distress.    Appearance: Normal appearance. She is not ill-appearing or toxic-appearing.  HENT:     Head:   Cardiovascular:     Rate and Rhythm: Normal rate and regular rhythm.     Heart sounds: Normal heart sounds.  Pulmonary:     Effort: Pulmonary effort is normal.     Breath sounds: Normal breath sounds.  Skin:    Findings: Erythema and rash present. Rash is vesicular.  Neurological:     Mental Status: She is alert.           Assessment & Plan:  Colon cancer screening - Plan: Ambulatory referral to Gastroenterology  Recurrent cold sores  We discussed options, and the patient elects to try Valtrex 500 mg daily for preventative/suppression at least until her stress is better.  She also would like me to schedule her to meet with  a gastroenterologist for colon cancer screening.  I will be glad to do this.

## 2019-09-08 ENCOUNTER — Telehealth: Payer: Self-pay | Admitting: Podiatry

## 2019-09-08 DIAGNOSIS — M792 Neuralgia and neuritis, unspecified: Secondary | ICD-10-CM

## 2019-09-08 NOTE — Telephone Encounter (Signed)
Faxed orders, clinicals and demographics to [facility].

## 2019-09-08 NOTE — Telephone Encounter (Signed)
Pt called stating she was referred to [facility]  several months ago and was unable to go at that time. Pt is able to go now and was told that an updated referral would need to be sent in for her.   Please update and resubmit referral to [facility] for patient.

## 2019-09-08 NOTE — Addendum Note (Signed)
Addended by: Harriett Sine D on: 09/08/2019 04:07 PM   Modules accepted: Orders

## 2019-09-11 ENCOUNTER — Encounter: Payer: Self-pay | Admitting: Neurology

## 2019-09-28 DIAGNOSIS — R140 Abdominal distension (gaseous): Secondary | ICD-10-CM | POA: Diagnosis not present

## 2019-09-28 DIAGNOSIS — R194 Change in bowel habit: Secondary | ICD-10-CM | POA: Diagnosis not present

## 2019-09-28 DIAGNOSIS — R1033 Periumbilical pain: Secondary | ICD-10-CM | POA: Diagnosis not present

## 2019-09-28 DIAGNOSIS — Z1211 Encounter for screening for malignant neoplasm of colon: Secondary | ICD-10-CM | POA: Diagnosis not present

## 2019-10-02 ENCOUNTER — Other Ambulatory Visit: Payer: Self-pay | Admitting: Gastroenterology

## 2019-10-02 DIAGNOSIS — R7989 Other specified abnormal findings of blood chemistry: Secondary | ICD-10-CM

## 2019-10-10 ENCOUNTER — Ambulatory Visit (INDEPENDENT_AMBULATORY_CARE_PROVIDER_SITE_OTHER): Payer: BC Managed Care – PPO | Admitting: Neurology

## 2019-10-10 ENCOUNTER — Other Ambulatory Visit: Payer: Self-pay

## 2019-10-10 DIAGNOSIS — M792 Neuralgia and neuritis, unspecified: Secondary | ICD-10-CM | POA: Diagnosis not present

## 2019-10-10 DIAGNOSIS — M5417 Radiculopathy, lumbosacral region: Secondary | ICD-10-CM

## 2019-10-10 NOTE — Procedures (Signed)
[facility]  [place], [address]  [place], [place] [address] Tel: [phone] Fax:  [phone] Test Date:  10/10/2019  Patient: Deborah Jennings DOB: 26-Oct-1960 Physician: Narda Amber, DO  Sex: Female Height: 5\' 5"  Ref Phys: Celesta Gentile, DPM  ID#: [id number] Temp: 32.0C Technician:    Patient Complaints: This is a 59 year old female referred for evaluation of left foot numbness.  NCV & EMG Findings: Extensive electrodiagnostic testing of the left lower extremity and additional studies of the right shows:  1. Bilateral sural and superficial peroneal sensory responses are within normal limits. 2. Left peroneal motor response shows asymmetrically reduced amplitude (1.3 mV) at the extensor digitorum brevis and is normal at the tibialis anterior. Right peroneal and bilateral tibial motor responses are within normal limits. 3. Left tibial H reflex study is within normal limits. 4. Chronic motor axonal loss changes are seen affecting the L5-S1 myotomes on the left, without accompanying active denervation.  Impression: 1. Chronic L5-S1 radiculopathy affecting the left lower extremity, mild. 2. There is no evidence of a large fiber sensorimotor polyneuropathy affecting either lower extremity.   ___________________________ Narda Amber, DO    Nerve Conduction Studies Anti Sensory Summary Table   Stim Site NR Peak (ms) Norm Peak (ms) P-T Amp (V) Norm P-T Amp  Left Sup Peroneal Anti Sensory (Ant Lat Mall)  32C  12 cm    2.2 <4.6 5.1 >4  Right Sup Peroneal Anti Sensory (Ant Lat Mall)  32C  12 cm    1.6 <4.6 5.2 >4  Left Sural Anti Sensory (Lat Mall)  32C  Calf    2.9 <4.6 5.6 >4  Right Sural Anti Sensory (Lat Mall)  32C  Calf    2.0 <4.6 5.7 >4   Motor Summary Table   Stim Site NR Onset (ms) Norm Onset (ms) O-P Amp (mV) Norm O-P Amp Site1 Site2 Delta-0 (ms) Dist (cm) Vel (m/s) Norm Vel (m/s)  Left Peroneal Motor (Ext Dig Brev)  32C  Ankle    3.7 <6.0 1.3  >2.5 B Fib Ankle 5.4 31.0 57 >40  B Fib    9.1  1.3  Poplt B Fib 1.8 8.0 44 >40  Poplt    10.9  1.1         Right Peroneal Motor (Ext Dig Brev)  32C  Ankle    2.7 <6.0 4.0 >2.5 B Fib Ankle 6.8 34.0 50 >40  B Fib    9.5  3.5  Poplt B Fib 1.4 8.0 57 >40  Poplt    10.9  3.4         Left Peroneal TA Motor (Tib Ant)  32C  Fib Head    1.6 <4.5 3.0 >3 Poplit Fib Head 1.5 8.0 53 >40  Poplit    3.1  2.9         Right Peroneal TA Motor (Tib Ant)  32C  Fib Head    2.3 <4.5 3.4 >3 Poplit Fib Head 1.4 7.0 50 >40  Poplit    3.7  2.8         Left Tibial Motor (Abd Hall Brev)  32C  Ankle    2.7 <6.0 5.6 >4 Knee Ankle 7.6 37.0 49 >40  Knee    10.3  3.9         Right Tibial Motor (Abd Hall Brev)  32C  Ankle    2.8 <6.0 9.3 >4 Knee Ankle 7.1 40.0 56 >40  Knee    9.9  6.4          H Reflex Studies   NR H-Lat (ms) Lat Norm (ms) L-R H-Lat (ms)  Left Tibial (Gastroc)  32C     33.47 <35    EMG   Side Muscle Ins Act Fibs Psw Fasc Number Recrt Dur Dur. Amp Amp. Poly Poly. Comment  Left AntTibialis Nml Nml Nml Nml 1- Rapid Some 1+ Some 1+ Nml Nml N/A  Left Gastroc Nml Nml Nml Nml 1- Rapid Some 1+ Some 1+ Nml Nml N/A  Left Flex Dig Long Nml Nml Nml Nml 1- Rapid Some 1+ Some 1+ Nml Nml N/A  Left RectFemoris Nml Nml Nml Nml Nml Nml Nml Nml Nml Nml Nml Nml N/A  Left GluteusMed Nml Nml Nml Nml Nml Nml Nml Nml Nml Nml Nml Nml N/A  Left BicepsFemS Nml Nml Nml Nml 1- Rapid Some 1+ Some 1+ Nml Nml N/A      Waveforms:

## 2019-10-19 NOTE — Progress Notes (Signed)
Dr. Arnoldo Morale- I spoke with Joelene Millin and she has an appointment to see you. I just wanted to go ahead and send you the report of the NCV.

## 2019-12-04 ENCOUNTER — Other Ambulatory Visit: Payer: Self-pay | Admitting: Family Medicine

## 2019-12-04 ENCOUNTER — Other Ambulatory Visit: Payer: Self-pay | Admitting: Physician Assistant

## 2019-12-04 NOTE — Telephone Encounter (Signed)
Please Advise

## 2019-12-08 ENCOUNTER — Other Ambulatory Visit (HOSPITAL_COMMUNITY): Payer: Self-pay | Admitting: Neurosurgery

## 2019-12-08 ENCOUNTER — Ambulatory Visit (HOSPITAL_COMMUNITY)
Admission: RE | Admit: 2019-12-08 | Discharge: 2019-12-08 | Disposition: A | Discharge status: Home / Self Care | Payer: BC Managed Care – PPO | Source: Ambulatory Visit | Attending: Neurosurgery | Admitting: Neurosurgery

## 2019-12-08 ENCOUNTER — Other Ambulatory Visit: Payer: Self-pay

## 2019-12-08 DIAGNOSIS — M7989 Other specified soft tissue disorders: Secondary | ICD-10-CM

## 2019-12-08 DIAGNOSIS — M79662 Pain in left lower leg: Secondary | ICD-10-CM

## 2019-12-08 DIAGNOSIS — G8929 Other chronic pain: Secondary | ICD-10-CM | POA: Diagnosis not present

## 2019-12-08 DIAGNOSIS — M5441 Lumbago with sciatica, right side: Secondary | ICD-10-CM | POA: Diagnosis not present

## 2019-12-08 DIAGNOSIS — M5442 Lumbago with sciatica, left side: Secondary | ICD-10-CM | POA: Diagnosis not present

## 2019-12-08 DIAGNOSIS — M79605 Pain in left leg: Secondary | ICD-10-CM | POA: Diagnosis not present

## 2020-02-29 ENCOUNTER — Other Ambulatory Visit: Payer: Self-pay | Admitting: Family Medicine

## 2020-02-29 NOTE — Telephone Encounter (Signed)
Ok to refill??  Last office visit 09/01/2019.  Last refill 12/04/2019.

## 2020-03-01 ENCOUNTER — Telehealth: Payer: Self-pay | Admitting: Family Medicine

## 2020-03-01 NOTE — Telephone Encounter (Signed)
can Dr.Pickard prescribe medication for her migraine also having some nausea

## 2020-03-04 ENCOUNTER — Ambulatory Visit: Payer: BC Managed Care – PPO | Admitting: Family Medicine

## 2020-03-04 ENCOUNTER — Encounter: Payer: Self-pay | Admitting: Family Medicine

## 2020-03-04 ENCOUNTER — Other Ambulatory Visit: Payer: Self-pay

## 2020-03-04 VITALS — BP 128/74 | HR 84 | Temp 98.4°F | Ht 65.0 in | Wt 200.0 lb

## 2020-03-04 DIAGNOSIS — G43901 Migraine, unspecified, not intractable, with status migrainosus: Secondary | ICD-10-CM

## 2020-03-04 MED ORDER — PROMETHAZINE HCL 25 MG/ML IJ SOLN
25.0000 mg | Freq: Once | INTRAMUSCULAR | Status: AC
  Administered 2020-03-04: 25 mg via INTRAMUSCULAR

## 2020-03-04 MED ORDER — DEXAMETHASONE 4 MG PO TABS: 12.0000 mg | ORAL_TABLET | Freq: Two times a day (BID) | ORAL | 0 refills | Status: DC

## 2020-03-04 MED ORDER — METOCLOPRAMIDE HCL 10 MG PO TABS: 10.0000 mg | ORAL_TABLET | Freq: Four times a day (QID) | ORAL | 0 refills | Status: DC | PRN

## 2020-03-04 MED ORDER — KETOROLAC TROMETHAMINE 60 MG/2ML IM SOLN
60.0000 mg | Freq: Once | INTRAMUSCULAR | Status: AC
  Administered 2020-03-04: 60 mg via INTRAMUSCULAR

## 2020-03-04 MED ORDER — BUTALBITAL-APAP-CAFFEINE 50-325-40 MG PO TABS
1.0000 | ORAL_TABLET | Freq: Four times a day (QID) | ORAL | 0 refills | Status: AC | PRN
Start: 2020-03-04 — End: 2021-03-04

## 2020-03-04 NOTE — Progress Notes (Signed)
Subjective:    Patient ID: Deborah Jennings, female    DOB: [birth year], 60 y.o.   MRN: [id number]  HPI  Patient is a 60 year old Caucasian female who has a longstanding history of migraines.  She states that she has been getting 1 or 2 a month.  However she called me on call Friday night stating that she had a severe migraine.  I called out Imitrex 100 mg once and then repeat again in 2 hours if necessary in addition to Phenergan 25 mg every 6 hours.  She has been taking the Imitrex daily just as I have stated above in addition to the Phenergan.  She did this on Friday, Saturday, and Sunday.  She states that the Imitrex helps some but the headache will not go away.  Is been more than 72 hours now.  She reports an occipital headache that is a constant pressure-like pain.  She states that her head feels swollen.  She feels like she can feel her heartbeat.  She reports nausea.  She is been unable to eat due to the nausea.  She reports photophobia and phonophobia.  She denies any head trauma.  She denies any neurologic deficit.  She does report some dizziness but she believes this is due to the fact she is not been eating much over the last 3 days due to the pain.  She is sitting in the exam room with sunglasses on and the lights off tearful. Past Medical History:  Diagnosis Date  . ASCUS of cervix with negative high risk HPV 04/2017  . Asthma   . Endometriosis   . IBS (irritable bowel syndrome)   . Migraine    Past Surgical History:  Procedure Laterality Date  . ABDOMINAL HYSTERECTOMY     RSO  . CESAREAN SECTION    . DILATION AND CURETTAGE OF UTERUS    . lower back surgery     "[place]"  . OOPHORECTOMY     RSO  . PELVIC LAPAROSCOPY     Lysis of adhesions-Endometriosis   Current Outpatient Medications on File Prior to Visit  Medication Sig Dispense Refill  . albuterol (VENTOLIN HFA) 108 (90 Base) MCG/ACT inhaler INHALE 2 PUFFS INTO THE LUNGS EVERY 6 HOURS AS NEEDED FOR  WHEEZE OR SHORTNESS OF BREATH 8.5 g 11  . ALPRAZolam (XANAX) 1 MG tablet TAKE 1 TABLET BY MOUTH 3 TIMES A DAY AS NEEDED FOR ANXIETY 30 tablet 0  . estradiol (VIVELLE-DOT) 0.075 MG/24HR PLACE 1 PATCH ONTO THE SKIN 2 (TWO) TIMES A WEEK. 24 patch 3  . hyoscyamine (LEVBID) 0.375 MG 12 hr tablet Take 1 tablet (0.375 mg total) by mouth 2 (two) times daily. Requires office visit before any further refills can be given. 180 tablet 1  . meloxicam (MOBIC) 15 MG tablet TAKE 1 TABLET BY MOUTH EVERY DAY 30 tablet 0  . Multiple Vitamin (MULTIVITAMIN) tablet Take 1 tablet by mouth daily.    . mupirocin ointment (BACTROBAN) 2 % Place 1 application into the nose 2 (two) times daily. Apply twice daily to the knee 22 g 0  . naproxen (NAPROSYN) 500 MG tablet TAKE 1 TABLET BY MOUTH 2 TIMES DAILY WITH A MEAL 30 tablet 6  . promethazine (PHENERGAN) 25 MG tablet Take 1 tablet (25 mg total) by mouth every 8 (eight) hours as needed for nausea or vomiting. 20 tablet 0  . traMADol (ULTRAM) 50 MG tablet Take 1-2 tablets (50-100 mg total) by mouth 3 (three) times daily  as needed. 30 tablet 2  . valACYclovir (VALTREX) 500 MG tablet Take 1 tablet (500 mg total) by mouth daily. 30 tablet 11   No current facility-administered medications on file prior to visit.   Allergies  Allergen Reactions  . Ampicillin Swelling    "PCN is fine"   Social History   Socioeconomic History  . Marital status: Married    Spouse name: Not on file  . Number of children: Not on file  . Years of education: Not on file  . Highest education level: Not on file  Occupational History  . Not on file  Tobacco Use  . Smoking status: Former Research scientist (life sciences)  . Smokeless tobacco: Never Used  Vaping Use  . Vaping Use: Never used  Substance and Sexual Activity  . Alcohol use: Yes    Alcohol/week: 5.0 standard drinks    Types: 5 Standard drinks or equivalent per week  . Drug use: Never  . Sexual activity: Yes    Birth control/protection: Surgical     Comment: 1st intercourse 60 yo-More than 5 partners  Other Topics Concern  . Not on file  Social History Narrative  . Not on file   Social Determinants of Health   Financial Resource Strain: Not on file  Food Insecurity: Not on file  Transportation Needs: Not on file  Physical Activity: Not on file  Stress: Not on file  Social Connections: Not on file  Intimate Partner Violence: Not on file     Review of Systems  All other systems reviewed and are negative.      Objective:   Physical Exam Vitals reviewed.  Constitutional:      General: She is not in acute distress.    Appearance: Normal appearance. She is not ill-appearing or toxic-appearing.  HENT:     Head: Normocephalic and atraumatic.     Right Ear: Tympanic membrane and ear canal normal.     Left Ear: Tympanic membrane and ear canal normal.  Eyes:     Extraocular Movements: Extraocular movements intact.     Pupils: Pupils are equal, round, and reactive to light.  Cardiovascular:     Rate and Rhythm: Normal rate and regular rhythm.     Heart sounds: Normal heart sounds.  Pulmonary:     Effort: Pulmonary effort is normal.     Breath sounds: Normal breath sounds.  Musculoskeletal:     Cervical back: Neck supple. No rigidity.  Lymphadenopathy:     Cervical: No cervical adenopathy.  Neurological:     General: No focal deficit present.     Mental Status: She is alert and oriented to person, place, and time. Mental status is at baseline.     Cranial Nerves: No cranial nerve deficit.     Sensory: No sensory deficit.     Motor: No weakness.     Coordination: Coordination normal.     Gait: Gait normal.           Assessment & Plan:  Status migrainosus  Start by giving her 60 mg of Toradol IM x1 and 25 mg of Phenergan IM x1 now.  Hopefully this will ease the headache.  Then I want her to go home and start Fioricet 1 tablet every 6 hours as needed for headache in addition to Reglan 10 mg 1 tablet every 6 hours  as needed for headache.  I will also give her Decadron 12 mg twice a day for 1 day to prevent a rebound migraine.  Discontinue Imitrex  and Phenergan as they have been ineffective.

## 2020-03-04 NOTE — Addendum Note (Signed)
Addended by: Amalia Hailey on: 03/04/2020 04:27 PM   Modules accepted: Orders

## 2020-03-04 NOTE — Telephone Encounter (Signed)
Call placed to patient. States that she has had HA/N since Friday.   Advised that she may require injection to break migraine.   Appointment scheduled for evaluation.

## 2020-03-12 DIAGNOSIS — M545 Low back pain, unspecified: Secondary | ICD-10-CM | POA: Diagnosis not present

## 2020-03-12 DIAGNOSIS — M5441 Lumbago with sciatica, right side: Secondary | ICD-10-CM | POA: Diagnosis not present

## 2020-03-21 ENCOUNTER — Ambulatory Visit: Payer: BC Managed Care – PPO | Admitting: Family Medicine

## 2020-03-21 ENCOUNTER — Other Ambulatory Visit: Payer: Self-pay

## 2020-03-21 ENCOUNTER — Encounter: Payer: Self-pay | Admitting: Family Medicine

## 2020-03-21 VITALS — BP 134/74 | HR 100 | Temp 98.7°F | Resp 18 | Ht 65.0 in | Wt 220.0 lb

## 2020-03-21 DIAGNOSIS — F321 Major depressive disorder, single episode, moderate: Secondary | ICD-10-CM

## 2020-03-21 MED ORDER — ESCITALOPRAM OXALATE 10 MG PO TABS: 10.0000 mg | ORAL_TABLET | Freq: Every day | ORAL | 3 refills | Status: DC
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
## 2020-04-05 DIAGNOSIS — D123 Benign neoplasm of transverse colon: Secondary | ICD-10-CM | POA: Diagnosis not present

## 2020-04-05 DIAGNOSIS — R194 Change in bowel habit: Secondary | ICD-10-CM | POA: Diagnosis not present

## 2020-04-05 DIAGNOSIS — Z1211 Encounter for screening for malignant neoplasm of colon: Secondary | ICD-10-CM | POA: Diagnosis not present

## 2020-04-05 DIAGNOSIS — D125 Benign neoplasm of sigmoid colon: Secondary | ICD-10-CM | POA: Diagnosis not present

## 2020-04-05 DIAGNOSIS — D120 Benign neoplasm of cecum: Secondary | ICD-10-CM | POA: Diagnosis not present

## 2020-04-05 DIAGNOSIS — K635 Polyp of colon: Secondary | ICD-10-CM | POA: Diagnosis not present

## 2020-04-05 DIAGNOSIS — D122 Benign neoplasm of ascending colon: Secondary | ICD-10-CM | POA: Diagnosis not present

## 2020-04-08 ENCOUNTER — Other Ambulatory Visit: Payer: Self-pay | Admitting: Family Medicine

## 2020-04-10 LAB — HM COLONOSCOPY

## 2020-04-11 ENCOUNTER — Other Ambulatory Visit: Payer: Self-pay | Admitting: Family Medicine

## 2020-04-12 ENCOUNTER — Other Ambulatory Visit: Payer: Self-pay | Admitting: Family Medicine

## 2020-04-15 ENCOUNTER — Encounter: Payer: Self-pay | Admitting: Family Medicine

## 2020-04-15 NOTE — Progress Notes (Signed)
According to the report the patient did not prep very well.

## 2020-04-22 ENCOUNTER — Other Ambulatory Visit: Payer: Self-pay

## 2020-04-22 ENCOUNTER — Encounter: Payer: Self-pay | Admitting: Family Medicine

## 2020-04-22 ENCOUNTER — Ambulatory Visit: Payer: BC Managed Care – PPO | Admitting: Family Medicine

## 2020-04-22 VITALS — BP 158/88 | HR 90 | Temp 98.0°F | Resp 14 | Ht 65.0 in | Wt 214.0 lb

## 2020-04-22 DIAGNOSIS — I10 Essential (primary) hypertension: Secondary | ICD-10-CM

## 2020-04-22 MED ORDER — VALSARTAN 160 MG PO TABS: 160.0000 mg | ORAL_TABLET | Freq: Every day | ORAL | 1 refills | Status: DC

## 2020-04-22 MED ORDER — CLONAZEPAM 0.5 MG PO TABS: 0.5000 mg | ORAL_TABLET | Freq: Three times a day (TID) | ORAL | 1 refills | Status: DC | PRN

## 2020-04-22 MED ORDER — DICYCLOMINE HCL 20 MG PO TABS: 20.0000 mg | ORAL_TABLET | Freq: Four times a day (QID) | ORAL | 1 refills | Status: DC | PRN
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
## 2020-04-23 ENCOUNTER — Encounter: Payer: Self-pay | Admitting: *Deleted

## 2020-04-23 LAB — CBC WITH DIFFERENTIAL/PLATELET
Absolute Monocytes: 528 cells/uL (ref 200–950)
Basophils Absolute: 18 cells/uL (ref 0–200)
Basophils Relative: 0.2 %
Eosinophils Absolute: 97 cells/uL (ref 15–500)
Eosinophils Relative: 1.1 %
HCT: 45.6 % — ABNORMAL HIGH (ref 35.0–45.0)
Hemoglobin: 15.7 g/dL — ABNORMAL HIGH (ref 11.7–15.5)
Lymphs Abs: 1822 cells/uL (ref 850–3900)
MCH: 32 pg (ref 27.0–33.0)
MCHC: 34.4 g/dL (ref 32.0–36.0)
MCV: 92.9 fL (ref 80.0–100.0)
MPV: 12.2 fL (ref 7.5–12.5)
Monocytes Relative: 6 %
Neutro Abs: 6336 cells/uL (ref 1500–7800)
Neutrophils Relative %: 72 %
Platelets: 231 10*3/uL (ref 140–400)
RBC: 4.91 10*6/uL (ref 3.80–5.10)
RDW: 14.2 % (ref 11.0–15.0)
Total Lymphocyte: 20.7 %
WBC: 8.8 10*3/uL (ref 3.8–10.8)

## 2020-04-23 LAB — COMPLETE METABOLIC PANEL WITH GFR
AG Ratio: 1.8 (calc) (ref 1.0–2.5)
ALT: 25 U/L (ref 6–29)
AST: 26 U/L (ref 10–35)
Albumin: 4.5 g/dL (ref 3.6–5.1)
Alkaline phosphatase (APISO): 65 U/L (ref 37–153)
BUN: 16 mg/dL (ref 7–25)
CO2: 23 mmol/L (ref 20–32)
Calcium: 9.4 mg/dL (ref 8.6–10.4)
Chloride: 103 mmol/L (ref 98–110)
Creat: 0.54 mg/dL (ref 0.50–1.05)
GFR, Est African American: 121 mL/min/{1.73_m2} (ref >=60)
GFR, Est Non African American: 104 mL/min/{1.73_m2} (ref >=60)
Globulin: 2.5 g/dL (calc) (ref 1.9–3.7)
Glucose, Bld: 105 mg/dL — ABNORMAL HIGH (ref 65–99)
Potassium: 4.2 mmol/L (ref 3.5–5.3)
Sodium: 140 mmol/L (ref 135–146)
Total Bilirubin: 0.6 mg/dL (ref 0.2–1.2)
Total Protein: 7 g/dL (ref 6.1–8.1)

## 2020-05-29 ENCOUNTER — Telehealth: Payer: Self-pay | Admitting: *Deleted

## 2020-05-29 NOTE — Telephone Encounter (Signed)
Received VM from patient.   Reports elevated BP with HA, dizziness and tachycardia.   Call placed to patient to inquire.   Reports that she was seen at back specialist on 05/28/2020 and bP noted >170/ 120's. States that BP today noted >150/ 110's.   Reports tachycardia >100's, chest tightness and severe HA on top of head. States that she is very dizzy as well.   Advised to go to ER for evaluation. Verbalized understanding.

## 2020-05-30 ENCOUNTER — Other Ambulatory Visit: Payer: Self-pay | Admitting: Family Medicine

## 2020-05-30 ENCOUNTER — Telehealth: Payer: Self-pay | Admitting: Family Medicine

## 2020-05-30 ENCOUNTER — Other Ambulatory Visit: Payer: Self-pay | Admitting: *Deleted

## 2020-05-30 DIAGNOSIS — I10 Essential (primary) hypertension: Secondary | ICD-10-CM

## 2020-05-30 MED ORDER — DICYCLOMINE HCL 20 MG PO TABS: 20.0000 mg | ORAL_TABLET | Freq: Four times a day (QID) | ORAL | 1 refills | Status: DC | PRN

## 2020-05-30 MED ORDER — HYDROCHLOROTHIAZIDE 25 MG PO TABS
25.0000 mg | ORAL_TABLET | Freq: Every day | ORAL | 3 refills | Status: DC
Start: 2020-05-30 — End: 2021-03-26

## 2020-05-30 NOTE — Telephone Encounter (Signed)
Pt called in asking if Dr. Dennard Schaumann would please give her a call, she didn't state the reason for this call.  Cb#: [id number]

## 2020-05-30 NOTE — Telephone Encounter (Signed)
Call placed to patient to inquire.   Patient states that she did not go to ER for evaluation of HTN. Reports that she is unable to leave her mother as she is the sole caregiver.  States that she continues to have elevated blood pressure. Reports that BP noted 173/109, HR 76 today. States that she is taking Valsartan 160mg .   Reports that she does have some slight swelling to L LE, and HA continues.   Please advise.

## 2020-05-30 NOTE — Telephone Encounter (Signed)
Please see prior message.   

## 2020-05-30 NOTE — Telephone Encounter (Signed)
Add HCTZ 25 mg poqday.

## 2020-05-30 NOTE — Telephone Encounter (Signed)
Call placed to patient and patient made aware.   States that she will start HCTZ tonight.

## 2020-05-31 NOTE — Telephone Encounter (Signed)
Received call from patient.   Reports that she took both HCTZ 25mg  and Diovan 160mg  around 7am. Reports that BP noted at 189/114 1 hour after medication administration.   Denies HA, chest pain, blurry vision. Endorses fatigue.   Please advise.

## 2020-05-31 NOTE — Telephone Encounter (Signed)
Please allow medication to take affect over next week and recheck here at ov.

## 2020-05-31 NOTE — Telephone Encounter (Signed)
Call placed to patient and patient made aware. Advised that medication will take time to build up in system, but as long as readings are improving and there is improvement in Sx, she can continue medications as directed. Given advice again on when to go to ER. Appointment scheduled.

## 2020-06-04 ENCOUNTER — Ambulatory Visit: Payer: Self-pay | Admitting: Family Medicine

## 2020-06-04 NOTE — Telephone Encounter (Signed)
Received call from patient.   Reports that BP has been much improved over weekend with addition of HCTZ.     Reports that current BP noted at 128/88. States that she is having no Sx.   Advised to keep appointment today to F/U.

## 2020-06-07 ENCOUNTER — Ambulatory Visit (INDEPENDENT_AMBULATORY_CARE_PROVIDER_SITE_OTHER): Payer: No Typology Code available for payment source | Admitting: *Deleted

## 2020-06-07 ENCOUNTER — Other Ambulatory Visit: Payer: Self-pay

## 2020-06-07 VITALS — BP 122/60 | HR 60 | Temp 98.0°F | Resp 16 | Ht 65.0 in | Wt 214.0 lb

## 2020-06-07 DIAGNOSIS — I10 Essential (primary) hypertension: Secondary | ICD-10-CM

## 2020-06-07 NOTE — Progress Notes (Signed)
Patient seen in office to check BP.   BP 122/60   Pulse 60   Temp 98 F (36.7 C) (Temporal)   Resp 16   Ht 5\' 5"  (1.651 m)   Wt 214 lb (97.1 kg)   SpO2 96%   BMI 35.61 kg/m   Patient is currently taking Valsartan 160mg  PO QD and HCTZ 25mg  PO QD.   Advised to continue current medications as prescribed.

## 2020-06-24 ENCOUNTER — Other Ambulatory Visit: Payer: Self-pay

## 2020-06-24 DIAGNOSIS — I10 Essential (primary) hypertension: Secondary | ICD-10-CM

## 2020-06-24 MED ORDER — VALSARTAN 160 MG PO TABS: 160.0000 mg | ORAL_TABLET | Freq: Every day | ORAL | 3 refills | Status: DC

## 2020-07-12 ENCOUNTER — Other Ambulatory Visit: Payer: Self-pay | Admitting: *Deleted

## 2020-07-12 DIAGNOSIS — I10 Essential (primary) hypertension: Secondary | ICD-10-CM

## 2020-07-12 MED ORDER — SUMATRIPTAN SUCCINATE 100 MG PO TABS: ORAL_TABLET | ORAL | 1 refills | Status: DC

## 2020-07-12 MED ORDER — VALSARTAN 160 MG PO TABS: 160.0000 mg | ORAL_TABLET | Freq: Every day | ORAL | 3 refills | Status: DC

## 2020-07-23 ENCOUNTER — Other Ambulatory Visit: Payer: Self-pay | Admitting: Family Medicine

## 2020-07-23 DIAGNOSIS — I10 Essential (primary) hypertension: Secondary | ICD-10-CM

## 2020-07-24 ENCOUNTER — Other Ambulatory Visit: Payer: Self-pay | Admitting: Family Medicine

## 2020-07-24 DIAGNOSIS — I10 Essential (primary) hypertension: Secondary | ICD-10-CM

## 2020-08-10 NOTE — Progress Notes (Signed)
Subjective:    Patient ID: Deborah Jennings, female    DOB: [birth year], 60 y.o.   MRN: [id number]  HPI: Deborah Jennings is a 60 y.o. female presenting with mother in law for elevated blood pressure and headache.   Chief Complaint  Patient presents with   Hypertension   Patient reports her blood pressure has been high at home for the past 4 weeks.  She and her mother in law and husband have all been sick - high blood pressure, headache, diarrhea.  She reports they ate a very large jar of Jif peanut butter before they realized it was recalled for salmonella.  Now, she is having some cough and congestion.    HYPERTENSION Currently taking HCTZ 25 mg and valsartan 160 mg daily.   Duration: 4 weeks Hypertension status: uncontrolled Satisfied with current treatment? no Duration of hypertension: chronic BP monitoring frequency:  daily BP range: 250-150; 136/106 BP medication side effects:  no Aspirin: no Recurrent headaches: yes Visual changes:  yes; blurred vision Palpitations: yes Dyspnea: no Chest pain: no Lower extremity edema: no Dizzy/lightheaded: no Nausea: yes   ABDOMINAL PAIN  Duration: weeks Onset: sudden Severity: moderate to severe Quality:cramping Location:  generalized  Episode duration:  constant Radiation: no Frequency: constant Fever: no Nausea: yes Vomiting: no Weight loss: no Decreased appetite: yes Diarrhea: yes;  Constipation: no Blood in stool: yes - pink, having hemorrhoids Heartburn: yes Jaundice: no Rash: no Dysuria/urinary frequency: no Hematuria: no  SKIN INFECTION Reports circular lesion popped up on her skin a couple of days ago.  Duration: days Location: right forearm History of trauma in area: no Pain: no Itching: no Burning: no Redness: yes Oozing: no Scaling: yes Blisters: no Painful: no Fevers: no Change in detergents/soaps/personal care products: no Recent illness: yes Recent travel:no History of same: no Context:  stable Alleviating factors: nothing Treatments attempted: antibiotic ointment Shortness of breath: no  Throat/tongue swelling: no Myalgias/arthralgias: no  Allergies  Allergen Reactions   Ampicillin Swelling    "PCN is fine"    Outpatient Encounter Medications as of 08/12/2020  Medication Sig   Clotrimazole 1 % OINT Apply 1 application topically 2 (two) times daily for 7 days.   hydrocortisone (ANUSOL-HC) 25 MG suppository Place 1 suppository (25 mg total) rectally 2 (two) times daily as needed for hemorrhoids or anal itching.   albuterol (VENTOLIN HFA) 108 (90 Base) MCG/ACT inhaler INHALE 2 PUFFS INTO THE LUNGS EVERY 6 HOURS AS NEEDED FOR WHEEZE OR SHORTNESS OF BREATH   ALPRAZolam (XANAX) 1 MG tablet TAKE 1 TABLET BY MOUTH 3 TIMES A DAY AS NEEDED FOR ANXIETY   butalbital-acetaminophen-caffeine (FIORICET) 50-325-40 MG tablet Take 1-2 tablets by mouth every 6 (six) hours as needed for headache.   clonazePAM (KLONOPIN) 0.5 MG tablet Take 1 tablet (0.5 mg total) by mouth 3 (three) times daily as needed for anxiety.   dicyclomine (BENTYL) 20 MG tablet TAKE 1 TABLET BY MOUTH EVERY 6 HOURS AS NEEDED FOR SPASMS.   escitalopram (LEXAPRO) 10 MG tablet TAKE 1 TABLET BY MOUTH EVERY DAY   estradiol (VIVELLE-DOT) 0.075 MG/24HR PLACE 1 PATCH ONTO THE SKIN 2 (TWO) TIMES A WEEK.   hydrochlorothiazide (HYDRODIURIL) 25 MG tablet Take 1 tablet (25 mg total) by mouth daily.   hyoscyamine (LEVBID) 0.375 MG 12 hr tablet TAKE 1 TABLET BY MOUTH 2 TIMES DAILY. REQUIRES OFFICE VISIT BEFORE ANY FURTHER REFILLS CAN BE GIVEN.   meloxicam (MOBIC) 15 MG tablet TAKE 1 TABLET BY MOUTH EVERY DAY  metoCLOPramide (REGLAN) 10 MG tablet Take 1 tablet (10 mg total) by mouth every 6 (six) hours as needed for nausea (migraine).   Multiple Vitamin (MULTIVITAMIN) tablet Take 1 tablet by mouth daily.   mupirocin ointment (BACTROBAN) 2 % Place 1 application into the nose 2 (two) times daily. Apply twice daily to the knee    naproxen (NAPROSYN) 500 MG tablet TAKE 1 TABLET BY MOUTH 2 TIMES DAILY WITH A MEAL   promethazine (PHENERGAN) 25 MG tablet Take 1 tablet (25 mg total) by mouth every 8 (eight) hours as needed for nausea or vomiting.   SUMAtriptan (IMITREX) 100 MG tablet TAKE 1 TABLET IMMEDIATELY FOR MIGRAINE CAN REPEAT 1 ADDITIONAL TABLET IN 2 HRS IF MIGRAINE PERSISTS   traMADol (ULTRAM) 50 MG tablet Take 1-2 tablets (50-100 mg total) by mouth 3 (three) times daily as needed.   valACYclovir (VALTREX) 500 MG tablet Take 1 tablet (500 mg total) by mouth daily.   valsartan (DIOVAN) 160 MG tablet Take 1 tablet (160 mg total) by mouth daily.   [DISCONTINUED] promethazine (PHENERGAN) 25 MG tablet Take 1 tablet (25 mg total) by mouth every 8 (eight) hours as needed for nausea or vomiting.   No facility-administered encounter medications on file as of 08/12/2020.    Patient Active Problem List   Diagnosis Date Noted   Acute pain of right knee 12/20/2018   Ganglion 05/26/2017   Paronychia of right index finger 05/13/2017   Avulsion fracture of right ankle 03/29/2014   Right shoulder pain 12/05/2013   Sprain of right ankle 12/05/2013   Migraine    Endometriosis    IBS (irritable bowel syndrome)     Past Medical History:  Diagnosis Date   ASCUS of cervix with negative high risk HPV 04/2017   Asthma    Endometriosis    IBS (irritable bowel syndrome)    Migraine     Relevant past medical, surgical, family and social history reviewed and updated as indicated. Interim medical history since our last visit reviewed.  Review of Systems Per HPI unless specifically indicated above     Objective:    BP 138/78 (BP Location: Right Arm, Patient Position: Sitting)   Pulse 84   Temp 98.4 F (36.9 C) (Oral)   Ht 5\' 4"  (1.626 m)   Wt 214 lb (97.1 kg)   SpO2 95%   BMI 36.73 kg/m   Wt Readings from Last 3 Encounters:  08/12/20 214 lb (97.1 kg)  06/07/20 214 lb (97.1 kg)  04/22/20 214 lb (97.1 kg)    Physical  Exam Vitals and nursing note reviewed.  Constitutional:      General: She is not in acute distress.    Appearance: Normal appearance. She is not toxic-appearing.  HENT:     Head: Normocephalic and atraumatic.  Eyes:     General: No scleral icterus.    Extraocular Movements: Extraocular movements intact.  Cardiovascular:     Rate and Rhythm: Normal rate and regular rhythm.     Heart sounds: Normal heart sounds.  Pulmonary:     Effort: Pulmonary effort is normal. No respiratory distress.     Breath sounds: Normal breath sounds. No wheezing or rales.  Abdominal:     General: Abdomen is flat. Bowel sounds are normal. There is no distension.     Palpations: Abdomen is soft.     Tenderness: There is no abdominal tenderness. There is no right CVA tenderness, left CVA tenderness or guarding.  Musculoskeletal:  General: Normal range of motion.     Cervical back: Normal range of motion and neck supple.     Right lower leg: No edema.     Left lower leg: No edema.  Skin:    General: Skin is warm and dry.     Capillary Refill: Capillary refill takes less than 2 seconds.     Coloration: Skin is not jaundiced or pale.     Findings: Rash present. No erythema. Rash is crusting and scaling.          Comments: Erythematous, annular rash without central clearing.    Neurological:     Mental Status: She is alert and oriented to person, place, and time.  Psychiatric:        Mood and Affect: Mood normal.        Behavior: Behavior normal.        Thought Content: Thought content normal.        Judgment: Judgment normal.      Assessment & Plan:  1. Diarrhea, unspecified type Acute.  Unclear etiology at this point although differentials include infectious like salmonella, C. Diff, less likely viral given length of symptoms.  Also include IBS flare.  Unfortunately, our lab person had to leave today due to illness, so the patient will return for blood work, stool studies and urine testing.  Her  vital signs are stable today.  I encouraged plenty of hydration, soft foods as tolerated.  Follow up pending lab work.  - COMPLETE METABOLIC PANEL WITH GFR; Future - TSH; Future - CBC with Differential/Platelet; Future - Urinalysis, Routine w reflex microscopic; Future - Stool culture; Future - C. difficile GDH and Toxin A/B; Future  2. Hemorrhoids, unspecified hemorrhoid type With bleeding, will start Anusol suppository.    - hydrocortisone (ANUSOL-HC) 25 MG suppository; Place 1 suppository (25 mg total) rectally 2 (two) times daily as needed for hemorrhoids or anal itching.  Dispense: 12 suppository; Refill: 0  3. Skin lesion Presentation consistent with fungal rash - will treat with topical clotrimazole and follow up with no improvement in 1 week.  - Clotrimazole 1 % OINT; Apply 1 application topically 2 (two) times daily for 7 days.  Dispense: 56.7 g; Refill: 0  4. Nausea Refill given for phenergan to use sparingly to prevent vomiting.  - COMPLETE METABOLIC PANEL WITH GFR; Future - TSH; Future - CBC with Differential/Platelet; Future - promethazine (PHENERGAN) 25 MG tablet; Take 1 tablet (25 mg total) by mouth every 8 (eight) hours as needed for nausea or vomiting.  Dispense: 20 tablet; Refill: 0     Follow up plan: Return for pending lab work.

## 2020-08-12 ENCOUNTER — Encounter: Payer: Self-pay | Admitting: Nurse Practitioner

## 2020-08-12 ENCOUNTER — Other Ambulatory Visit: Payer: Self-pay

## 2020-08-12 ENCOUNTER — Ambulatory Visit (INDEPENDENT_AMBULATORY_CARE_PROVIDER_SITE_OTHER): Payer: PRIVATE HEALTH INSURANCE | Admitting: Nurse Practitioner

## 2020-08-12 VITALS — BP 138/78 | HR 84 | Temp 98.4°F | Ht 64.0 in | Wt 214.0 lb

## 2020-08-12 DIAGNOSIS — R110 Nausea: Secondary | ICD-10-CM | POA: Diagnosis not present

## 2020-08-12 DIAGNOSIS — K649 Unspecified hemorrhoids: Secondary | ICD-10-CM

## 2020-08-12 DIAGNOSIS — L989 Disorder of the skin and subcutaneous tissue, unspecified: Secondary | ICD-10-CM | POA: Diagnosis not present

## 2020-08-12 DIAGNOSIS — R197 Diarrhea, unspecified: Secondary | ICD-10-CM | POA: Diagnosis not present

## 2020-08-12 DIAGNOSIS — I10 Essential (primary) hypertension: Secondary | ICD-10-CM

## 2020-08-13 ENCOUNTER — Encounter: Payer: Self-pay | Admitting: Nurse Practitioner

## 2020-08-13 ENCOUNTER — Other Ambulatory Visit: Payer: Self-pay | Admitting: Family Medicine

## 2020-08-13 ENCOUNTER — Other Ambulatory Visit: Payer: No Typology Code available for payment source

## 2020-08-13 ENCOUNTER — Telehealth: Payer: Self-pay | Admitting: Family Medicine

## 2020-08-13 MED ORDER — HYDROCORTISONE ACETATE 25 MG RE SUPP: 25.0000 mg | Freq: Two times a day (BID) | RECTAL | 0 refills | Status: DC | PRN

## 2020-08-13 MED ORDER — CLOTRIMAZOLE 1 % EX OINT: 1.0000 "application " | TOPICAL_OINTMENT | Freq: Two times a day (BID) | CUTANEOUS | 0 refills | Status: AC

## 2020-08-13 MED ORDER — PROMETHAZINE HCL 25 MG PO TABS: 25.0000 mg | ORAL_TABLET | Freq: Three times a day (TID) | ORAL | 0 refills | Status: DC | PRN

## 2020-08-13 MED ORDER — HYDROCORTISONE 2.5 % EX CREA: TOPICAL_CREAM | Freq: Two times a day (BID) | CUTANEOUS | 0 refills | Status: DC

## 2020-08-13 NOTE — Telephone Encounter (Signed)
Request sent to pcp suggesting alternative medication for suppository due to pricing

## 2020-08-13 NOTE — Telephone Encounter (Signed)
Patient called to request different Rx for suppository to relieve diarrhea - generic script needed for affordable price (patient being charged $100 but normally pays about $36).   Pharmacy confirmed as   [facility] [address] [place]), [place] - [address]  [address] [facility] [place] [address]  Phone:  [phone]  Fax:  [phone]   Please advise at [phone]

## 2020-08-14 ENCOUNTER — Other Ambulatory Visit: Payer: No Typology Code available for payment source

## 2020-08-14 ENCOUNTER — Other Ambulatory Visit: Payer: Self-pay

## 2020-08-14 ENCOUNTER — Other Ambulatory Visit: Payer: Self-pay | Admitting: Family Medicine

## 2020-08-14 DIAGNOSIS — R197 Diarrhea, unspecified: Secondary | ICD-10-CM

## 2020-08-14 DIAGNOSIS — R110 Nausea: Secondary | ICD-10-CM

## 2020-08-14 NOTE — Telephone Encounter (Signed)
Ok to refill??  Last office visit 08/12/2020.  Last refill 04/22/2020, #1 refill.

## 2020-08-14 NOTE — Progress Notes (Unsigned)
Stool culture ordered.

## 2020-08-15 ENCOUNTER — Other Ambulatory Visit: Payer: Self-pay

## 2020-08-15 DIAGNOSIS — R110 Nausea: Secondary | ICD-10-CM

## 2020-08-15 LAB — URINALYSIS, ROUTINE W REFLEX MICROSCOPIC
Bacteria, UA: NONE SEEN /HPF
Bilirubin Urine: NEGATIVE
Glucose, UA: NEGATIVE
Hgb urine dipstick: NEGATIVE
Ketones, ur: NEGATIVE
Leukocytes,Ua: NEGATIVE
Nitrite: NEGATIVE
RBC / HPF: NONE SEEN /HPF (ref 0–2)
Specific Gravity, Urine: 1.015 (ref 1.001–1.035)
WBC, UA: NONE SEEN /HPF (ref 0–5)
pH: 5.5 (ref 5.0–8.0)

## 2020-08-15 LAB — MICROSCOPIC MESSAGE

## 2020-08-15 MED ORDER — PROMETHAZINE HCL 25 MG PO TABS: 25.0000 mg | ORAL_TABLET | Freq: Three times a day (TID) | ORAL | 0 refills | Status: DC | PRN

## 2020-08-15 NOTE — Telephone Encounter (Signed)
Pt is aware of change in therapy

## 2020-08-16 MED ORDER — DIPHENOXYLATE-ATROPINE 2.5-0.025 MG PO TABS
2.0000 | ORAL_TABLET | Freq: Four times a day (QID) | ORAL | 0 refills | Status: DC | PRN
Start: 2020-08-16 — End: 2021-12-19

## 2020-08-16 NOTE — Addendum Note (Signed)
Addended by: Noemi Chapel A on: 08/16/2020 08:23 AM   Modules accepted: Orders

## 2020-08-16 NOTE — Progress Notes (Signed)
Spoke with pt regarding lab results and additional testing added, understands that she is not to take naids. Pt states she has not been taking nsaids. She is also aware of the medication sent to pharmacy and how to take the med. She will follow up in 1-2 weeks

## 2020-08-19 ENCOUNTER — Other Ambulatory Visit: Payer: No Typology Code available for payment source

## 2020-08-19 ENCOUNTER — Other Ambulatory Visit: Payer: Self-pay

## 2020-08-19 DIAGNOSIS — R197 Diarrhea, unspecified: Secondary | ICD-10-CM

## 2020-08-19 LAB — CBC WITH DIFFERENTIAL/PLATELET
Absolute Monocytes: 511 cells/uL (ref 200–950)
Basophils Absolute: 43 cells/uL (ref 0–200)
Basophils Relative: 0.6 %
Eosinophils Absolute: 86 cells/uL (ref 15–500)
Eosinophils Relative: 1.2 %
HCT: 39.1 % (ref 35.0–45.0)
Hemoglobin: 13.3 g/dL (ref 11.7–15.5)
Lymphs Abs: 2052 cells/uL (ref 850–3900)
MCH: 32.9 pg (ref 27.0–33.0)
MCHC: 34 g/dL (ref 32.0–36.0)
MCV: 96.8 fL (ref 80.0–100.0)
MPV: 12.5 fL (ref 7.5–12.5)
Monocytes Relative: 7.1 %
Neutro Abs: 4507 cells/uL (ref 1500–7800)
Neutrophils Relative %: 62.6 %
Platelets: 215 10*3/uL (ref 140–400)
RBC: 4.04 10*6/uL (ref 3.80–5.10)
RDW: 13.6 % (ref 11.0–15.0)
Total Lymphocyte: 28.5 %
WBC: 7.2 10*3/uL (ref 3.8–10.8)

## 2020-08-19 LAB — HEPATITIS PANEL, ACUTE
Hep A IgM: NONREACTIVE
Hep B C IgM: NONREACTIVE
Hepatitis B Surface Ag: NONREACTIVE
Hepatitis C Ab: NONREACTIVE
SIGNAL TO CUT-OFF: 0.01 (ref <1.00)

## 2020-08-19 LAB — COMPLETE METABOLIC PANEL WITH GFR
AG Ratio: 1.8 (calc) (ref 1.0–2.5)
ALT: 31 U/L — ABNORMAL HIGH (ref 6–29)
AST: 40 U/L — ABNORMAL HIGH (ref 10–35)
Albumin: 4.4 g/dL (ref 3.6–5.1)
Alkaline phosphatase (APISO): 62 U/L (ref 37–153)
BUN/Creatinine Ratio: 23 (calc) — ABNORMAL HIGH (ref 6–22)
BUN: 31 mg/dL — ABNORMAL HIGH (ref 7–25)
CO2: 27 mmol/L (ref 20–32)
Calcium: 9.5 mg/dL (ref 8.6–10.4)
Chloride: 99 mmol/L (ref 98–110)
Creat: 1.32 mg/dL — ABNORMAL HIGH (ref 0.50–1.03)
Globulin: 2.4 g/dL (calc) (ref 1.9–3.7)
Glucose, Bld: 133 mg/dL — ABNORMAL HIGH (ref 65–99)
Potassium: 4.2 mmol/L (ref 3.5–5.3)
Sodium: 140 mmol/L (ref 135–146)
Total Bilirubin: 0.7 mg/dL (ref 0.2–1.2)
Total Protein: 6.8 g/dL (ref 6.1–8.1)
eGFR: 47 mL/min/{1.73_m2} — ABNORMAL LOW (ref >=60)

## 2020-08-19 LAB — TEST AUTHORIZATION

## 2020-08-19 LAB — TSH: TSH: 1.18 mIU/L (ref 0.40–4.50)

## 2020-08-21 LAB — C. DIFFICILE GDH AND TOXIN A/B
GDH ANTIGEN: DETECTED
MICRO NUMBER:: 12131134
SPECIMEN QUALITY:: ADEQUATE
TOXIN A AND B: NOT DETECTED

## 2020-08-21 LAB — SALMONELLA/SHIGELLA CULT, CAMPY EIA AND SHIGA TOXIN RFL ECOLI
MICRO NUMBER: 12130794
MICRO NUMBER:: 12130795
MICRO NUMBER:: 12130796
Result:: NOT DETECTED
SHIGA RESULT:: NOT DETECTED
SPECIMEN QUALITY: ADEQUATE
SPECIMEN QUALITY:: ADEQUATE
SPECIMEN QUALITY:: ADEQUATE

## 2020-08-21 LAB — CLOSTRIDIUM DIFFICILE TOXIN B, QUALITATIVE, REAL-TIME PCR: Toxigenic C. Difficile by PCR: NOT DETECTED

## 2020-08-22 NOTE — Progress Notes (Signed)
Pt notified via mychart

## 2020-08-30 ENCOUNTER — Ambulatory Visit (INDEPENDENT_AMBULATORY_CARE_PROVIDER_SITE_OTHER): Payer: PRIVATE HEALTH INSURANCE | Admitting: Family Medicine

## 2020-08-30 ENCOUNTER — Encounter: Payer: Self-pay | Admitting: Family Medicine

## 2020-08-30 ENCOUNTER — Other Ambulatory Visit: Payer: Self-pay

## 2020-08-30 VITALS — BP 134/84 | HR 82 | Temp 98.2°F | Resp 16 | Ht 64.0 in | Wt 211.0 lb

## 2020-08-30 DIAGNOSIS — R197 Diarrhea, unspecified: Secondary | ICD-10-CM | POA: Diagnosis not present

## 2020-08-30 LAB — COMPLETE METABOLIC PANEL WITH GFR
AG Ratio: 1.7 (calc) (ref 1.0–2.5)
ALT: 33 U/L — ABNORMAL HIGH (ref 6–29)
AST: 34 U/L (ref 10–35)
Albumin: 4.4 g/dL (ref 3.6–5.1)
Alkaline phosphatase (APISO): 69 U/L (ref 37–153)
BUN/Creatinine Ratio: 19 (calc) (ref 6–22)
BUN: 32 mg/dL — ABNORMAL HIGH (ref 7–25)
CO2: 28 mmol/L (ref 20–32)
Calcium: 10.6 mg/dL — ABNORMAL HIGH (ref 8.6–10.4)
Chloride: 98 mmol/L (ref 98–110)
Creat: 1.66 mg/dL — ABNORMAL HIGH (ref 0.50–1.03)
Globulin: 2.6 g/dL (calc) (ref 1.9–3.7)
Glucose, Bld: 113 mg/dL — ABNORMAL HIGH (ref 65–99)
Potassium: 4.6 mmol/L (ref 3.5–5.3)
Sodium: 139 mmol/L (ref 135–146)
Total Bilirubin: 0.6 mg/dL (ref 0.2–1.2)
Total Protein: 7 g/dL (ref 6.1–8.1)
eGFR: 36 mL/min/{1.73_m2} — ABNORMAL LOW (ref >=60)

## 2020-08-30 LAB — CBC WITH DIFFERENTIAL/PLATELET
Absolute Monocytes: 581 cells/uL (ref 200–950)
Basophils Absolute: 62 cells/uL (ref 0–200)
Basophils Relative: 0.7 %
Eosinophils Absolute: 141 cells/uL (ref 15–500)
Eosinophils Relative: 1.6 %
HCT: 40.3 % (ref 35.0–45.0)
Hemoglobin: 13.5 g/dL (ref 11.7–15.5)
Lymphs Abs: 2174 cells/uL (ref 850–3900)
MCH: 32.8 pg (ref 27.0–33.0)
MCHC: 33.5 g/dL (ref 32.0–36.0)
MCV: 98.1 fL (ref 80.0–100.0)
MPV: 12.3 fL (ref 7.5–12.5)
Monocytes Relative: 6.6 %
Neutro Abs: 5843 cells/uL (ref 1500–7800)
Neutrophils Relative %: 66.4 %
Platelets: 224 10*3/uL (ref 140–400)
RBC: 4.11 10*6/uL (ref 3.80–5.10)
RDW: 14.4 % (ref 11.0–15.0)
Total Lymphocyte: 24.7 %
WBC: 8.8 10*3/uL (ref 3.8–10.8)

## 2020-08-30 LAB — TSH: TSH: 0.67 mIU/L (ref 0.40–4.50)

## 2020-08-30 LAB — SEDIMENTATION RATE: Sed Rate: 25 mm/h (ref 0–30)

## 2020-08-30 MED ORDER — CLOTRIMAZOLE 1 % EX CREA: 1.0000 "application " | TOPICAL_CREAM | Freq: Two times a day (BID) | CUTANEOUS | 0 refills | Status: DC

## 2020-08-30 NOTE — Progress Notes (Signed)
Subjective:    Patient ID: Deborah Jennings, female    DOB: 02-Oct-1960, 60 y.o.   MRN: [id number]  Patient reports a 5-week history of diarrhea.  She states that she originally thought she had been exposed to [place].  She saw my partner who performed infectious studies including a negative stool culture, patient was positive for [place] antigen but negative for C. difficile toxin Aand B.  PCR analysis was negative for C. Diff.  My partner gave her Lomotil however this caused severe constipation and therefore she stopped the Lomotil.  She now states that she is going to the bathroom about 10 times a day.  She has had some bleeding but she attributes this to severe hemorrhoids that have become aggravated due to the diarrhea.  She denies any fevers or chills.  She denies any vomiting.  She does have some mild tenderness to palpation in epigastric area.  She also has a rash on the dorsum of her right forearm that appears to be ringworm.  It is roughly the size of a silver dollar.  There is a raised rolled red border with a central purplish area.  She denies any tick bites.  Is been there for 5 weeks. Past Medical History:  Diagnosis Date   ASCUS of cervix with negative high risk HPV 04/2017   Asthma    Endometriosis    IBS (irritable bowel syndrome)    Migraine    Past Surgical History:  Procedure Laterality Date   ABDOMINAL HYSTERECTOMY     RSO   CESAREAN SECTION     DILATION AND CURETTAGE OF UTERUS     lower back surgery     "FUSION AND DISC REMOVED"   OOPHORECTOMY     RSO   PELVIC LAPAROSCOPY     Lysis of adhesions-Endometriosis   Current Outpatient Medications on File Prior to Visit  Medication Sig Dispense Refill   albuterol (VENTOLIN HFA) 108 (90 Base) MCG/ACT inhaler INHALE 2 PUFFS INTO THE LUNGS EVERY 6 HOURS AS NEEDED FOR WHEEZE OR SHORTNESS OF BREATH 8.5 g 11   ALPRAZolam (XANAX) 1 MG tablet TAKE 1 TABLET BY MOUTH 3 TIMES A DAY AS NEEDED FOR ANXIETY 30 tablet 0    butalbital-acetaminophen-caffeine (FIORICET) 50-325-40 MG tablet Take 1-2 tablets by mouth every 6 (six) hours as needed for headache. 20 tablet 0   clonazePAM (KLONOPIN) 0.5 MG tablet TAKE 1 TABLET BY MOUTH THREE TIMES DAILY AS NEEDED FOR ANXIETY 90 tablet 1   dicyclomine (BENTYL) 20 MG tablet TAKE 1 TABLET BY MOUTH EVERY 6 HOURS AS NEEDED FOR SPASMS. 30 tablet 0   diphenoxylate-atropine (LOMOTIL) 2.5-0.025 MG tablet Take 2 tablets by mouth 4 (four) times daily as needed for diarrhea or loose stools. 30 tablet 0   escitalopram (LEXAPRO) 10 MG tablet TAKE 1 TABLET BY MOUTH EVERY DAY 90 tablet 2   estradiol (VIVELLE-DOT) 0.075 MG/24HR PLACE 1 PATCH ONTO THE SKIN 2 (TWO) TIMES A WEEK. 24 patch 3   hydrochlorothiazide (HYDRODIURIL) 25 MG tablet Take 1 tablet (25 mg total) by mouth daily. 90 tablet 3   hydrocortisone (ANUSOL-HC) 25 MG suppository Place 1 suppository (25 mg total) rectally 2 (two) times daily as needed for hemorrhoids or anal itching. 12 suppository 0   hydrocortisone 2.5 % cream Apply topically 2 (two) times daily. Apply to hemorrhoid 30 g 0   hyoscyamine (LEVBID) 0.375 MG 12 hr tablet TAKE 1 TABLET BY MOUTH 2 TIMES DAILY. REQUIRES OFFICE VISIT BEFORE ANY FURTHER REFILLS CAN  BE GIVEN. 180 tablet 1   meloxicam (MOBIC) 15 MG tablet TAKE 1 TABLET BY MOUTH EVERY DAY 30 tablet 0   metoCLOPramide (REGLAN) 10 MG tablet Take 1 tablet (10 mg total) by mouth every 6 (six) hours as needed for nausea (migraine). 20 tablet 0   Multiple Vitamin (MULTIVITAMIN) tablet Take 1 tablet by mouth daily.     mupirocin ointment (BACTROBAN) 2 % Place 1 application into the nose 2 (two) times daily. Apply twice daily to the knee 22 g 0   naproxen (NAPROSYN) 500 MG tablet TAKE 1 TABLET BY MOUTH 2 TIMES DAILY WITH A MEAL 30 tablet 6   promethazine (PHENERGAN) 25 MG tablet Take 1 tablet (25 mg total) by mouth every 8 (eight) hours as needed for nausea or vomiting. 20 tablet 0   SUMAtriptan (IMITREX) 100 MG tablet  TAKE 1 TABLET IMMEDIATELY FOR MIGRAINE CAN REPEAT 1 ADDITIONAL TABLET IN 2 HRS IF MIGRAINE PERSISTS 9 tablet 1   traMADol (ULTRAM) 50 MG tablet Take 1-2 tablets (50-100 mg total) by mouth 3 (three) times daily as needed. 30 tablet 2   valACYclovir (VALTREX) 500 MG tablet Take 1 tablet (500 mg total) by mouth daily. 30 tablet 11   valsartan (DIOVAN) 160 MG tablet Take 1 tablet (160 mg total) by mouth daily. 90 tablet 3   No current facility-administered medications on file prior to visit.   Allergies  Allergen Reactions   Ampicillin Swelling    "PCN is fine"   Social History   Socioeconomic History   Marital status: Married    Spouse name: Not on file   Number of children: Not on file   Years of education: Not on file   Highest education level: Not on file  Occupational History   Not on file  Tobacco Use   Smoking status: Former   Smokeless tobacco: Never  Vaping Use   Vaping Use: Never used  Substance and Sexual Activity   Alcohol use: Yes    Alcohol/week: 5.0 standard drinks    Types: 5 Standard drinks or equivalent per week   Drug use: Never   Sexual activity: Yes    Birth control/protection: Surgical    Comment: 1st intercourse 60 yo-More than 5 partners  Other Topics Concern   Not on file  Social History Narrative   Not on file   Social Determinants of Health   Financial Resource Strain: Not on file  Food Insecurity: Not on file  Transportation Needs: Not on file  Physical Activity: Not on file  Stress: Not on file  Social Connections: Not on file  Intimate Partner Violence: Not on file     Review of Systems  All other systems reviewed and are negative.     Objective:   Physical Exam Vitals reviewed.  Constitutional:      General: She is not in acute distress.    Appearance: Normal appearance. She is not ill-appearing or toxic-appearing.  HENT:     Head: Normocephalic and atraumatic.     Right Ear: Tympanic membrane and ear canal normal.     Left  Ear: Tympanic membrane and ear canal normal.  Eyes:     Extraocular Movements: Extraocular movements intact.     Pupils: Pupils are equal, round, and reactive to light.  Cardiovascular:     Rate and Rhythm: Normal rate and regular rhythm.     Heart sounds: Normal heart sounds.  Pulmonary:     Effort: Pulmonary effort is normal.  Breath sounds: Normal breath sounds.  Musculoskeletal:     Cervical back: Neck supple. No rigidity.  Lymphadenopathy:     Cervical: No cervical adenopathy.  Neurological:     General: No focal deficit present.     Mental Status: She is alert and oriented to person, place, and time. Mental status is at baseline.     Cranial Nerves: No cranial nerve deficit.     Sensory: No sensory deficit.     Motor: No weakness.     Coordination: Coordination normal.     Gait: Gait normal.          Assessment & Plan:  Diarrhea, unspecified type - Plan: CBC with Differential/Platelet, COMPLETE METABOLIC PANEL WITH GFR, TSH, Sedimentation rate Differential includes infectious diarrhea versus inflammatory bowel disease versus irritable bowel disease versus functional diarrhea.  I believe infection has been ruled out sufficiently with the stool cultures and the negative C. difficile assay.  I will check a sed rate and a CBC to evaluate for any evidence of an autoimmune process.  If elevated sed rate, would recommend GI consultation for colonoscopy and biopsy.  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

## 2020-09-04 ENCOUNTER — Other Ambulatory Visit: Payer: Self-pay | Admitting: *Deleted

## 2020-09-04 DIAGNOSIS — N289 Disorder of kidney and ureter, unspecified: Secondary | ICD-10-CM

## 2020-09-26 ENCOUNTER — Other Ambulatory Visit: Payer: Self-pay | Admitting: Nurse Practitioner

## 2020-09-26 ENCOUNTER — Other Ambulatory Visit: Payer: Self-pay | Admitting: Family Medicine

## 2020-09-26 DIAGNOSIS — R110 Nausea: Secondary | ICD-10-CM

## 2020-09-26 DIAGNOSIS — I10 Essential (primary) hypertension: Secondary | ICD-10-CM

## 2020-10-15 ENCOUNTER — Other Ambulatory Visit: Payer: Self-pay | Admitting: Family Medicine

## 2020-10-24 ENCOUNTER — Other Ambulatory Visit: Payer: Self-pay | Admitting: Family Medicine

## 2020-10-24 DIAGNOSIS — I10 Essential (primary) hypertension: Secondary | ICD-10-CM

## 2020-10-25 ENCOUNTER — Telehealth: Payer: Self-pay

## 2020-10-25 DIAGNOSIS — I10 Essential (primary) hypertension: Secondary | ICD-10-CM

## 2020-10-25 MED ORDER — DICYCLOMINE HCL 20 MG PO TABS: ORAL_TABLET | ORAL | 0 refills | Status: DC

## 2020-10-25 NOTE — Telephone Encounter (Signed)
Prescription sent to pharmacy.

## 2020-10-25 NOTE — Telephone Encounter (Signed)
Pt called in to check on the status of refill of dicyclomine (BENTYL) 20 MG tablet . Please call.   Cb#: [phone]

## 2020-11-05 ENCOUNTER — Telehealth: Payer: Self-pay | Admitting: Family Medicine

## 2020-11-05 NOTE — Telephone Encounter (Signed)
Patient called to request copies of bills from [facility] 7/29 and 7/13; wrong insurance company was billed. Patient's insurance has been [employer] Lennice Sites, [place] [id number]) since April but bills were sent to former insurance carrier Nurse, mental health). Bills total $1100. Patient will bring card to office again for copy to be placed in documents.   Patient requesting copy of bills when she comes to office to give copy of card if possible. If not, patient requesting bills be mailed to her home address. Please advise at [phone].

## 2020-11-11 NOTE — Telephone Encounter (Signed)
I have left msg for patient to return my call. I see both of these DS is under insurance responsibility.

## 2020-11-21 ENCOUNTER — Other Ambulatory Visit: Payer: Self-pay | Admitting: Family Medicine

## 2021-01-06 ENCOUNTER — Encounter (HOSPITAL_COMMUNITY): Payer: Self-pay | Admitting: Emergency Medicine

## 2021-01-06 ENCOUNTER — Ambulatory Visit (HOSPITAL_COMMUNITY)
Admission: EM | Admit: 2021-01-06 | Discharge: 2021-01-06 | Disposition: A | Discharge status: Home / Self Care | Payer: No Typology Code available for payment source | Attending: Emergency Medicine | Admitting: Emergency Medicine

## 2021-01-06 ENCOUNTER — Other Ambulatory Visit: Payer: Self-pay

## 2021-01-06 ENCOUNTER — Ambulatory Visit (INDEPENDENT_AMBULATORY_CARE_PROVIDER_SITE_OTHER): Payer: No Typology Code available for payment source

## 2021-01-06 DIAGNOSIS — R519 Headache, unspecified: Secondary | ICD-10-CM

## 2021-01-06 DIAGNOSIS — R059 Cough, unspecified: Secondary | ICD-10-CM

## 2021-01-06 DIAGNOSIS — J111 Influenza due to unidentified influenza virus with other respiratory manifestations: Secondary | ICD-10-CM

## 2021-01-06 MED ORDER — DEXAMETHASONE SODIUM PHOSPHATE 10 MG/ML IJ SOLN
10.0000 mg | Freq: Once | INTRAMUSCULAR | Status: AC
  Administered 2021-01-06: 10 mg via INTRAMUSCULAR

## 2021-01-06 MED ORDER — SUMATRIPTAN SUCCINATE 50 MG PO TABS: 50.0000 mg | ORAL_TABLET | ORAL | 0 refills | Status: DC | PRN

## 2021-01-06 MED ORDER — IBUPROFEN 800 MG PO TABS: 800.0000 mg | ORAL_TABLET | Freq: Three times a day (TID) | ORAL | 0 refills | Status: DC

## 2021-01-06 MED ORDER — METOCLOPRAMIDE HCL 5 MG/ML IJ SOLN
10.0000 mg | Freq: Once | INTRAMUSCULAR | Status: AC
  Administered 2021-01-06: 10 mg via INTRAMUSCULAR

## 2021-01-06 MED ORDER — METOCLOPRAMIDE HCL 5 MG/ML IJ SOLN
INTRAMUSCULAR | Status: AC
  Filled 2021-01-06: qty 2

## 2021-01-06 MED ORDER — KETOROLAC TROMETHAMINE 30 MG/ML IJ SOLN
30.0000 mg | Freq: Once | INTRAMUSCULAR | Status: AC
  Administered 2021-01-06: 30 mg via INTRAMUSCULAR

## 2021-01-06 MED ORDER — KETOROLAC TROMETHAMINE 30 MG/ML IJ SOLN
INTRAMUSCULAR | Status: AC
  Filled 2021-01-06: qty 1

## 2021-01-06 MED ORDER — SUMATRIPTAN SUCCINATE 6 MG/0.5ML ~~LOC~~ SOLN
SUBCUTANEOUS | Status: AC
  Filled 2021-01-06: qty 0.5

## 2021-01-06 MED ORDER — OSELTAMIVIR PHOSPHATE 75 MG PO CAPS: 75.0000 mg | ORAL_CAPSULE | Freq: Two times a day (BID) | ORAL | 0 refills | Status: DC

## 2021-01-06 MED ORDER — DEXAMETHASONE SODIUM PHOSPHATE 10 MG/ML IJ SOLN
INTRAMUSCULAR | Status: AC
  Filled 2021-01-06: qty 1

## 2021-01-06 MED ORDER — SUMATRIPTAN SUCCINATE 6 MG/0.5ML ~~LOC~~ SOLN
6.0000 mg | Freq: Once | SUBCUTANEOUS | Status: AC
  Administered 2021-01-06: 6 mg via SUBCUTANEOUS

## 2021-01-06 NOTE — Discharge Instructions (Addendum)
They are most likely being caused by influenza A, your symptoms are consistent with the current presentation, influenza is a virus and will steadily improve with time  You may take Tamiflu twice daily for the next 5 days, if you are able to get this medication from the pharmacy then continue supportive treatment, symptoms will improve as the virus works as well after system whether or not you take this medication  Your chest x-ray was negative   You may attempt use of ibuprofen every 8 hours as needed to manage headaches, if ineffective may attempt use of Imitrex, take 1 pill then in 2 hours if headache still persists may take a second dose, do not exceed 4 tablets in 1 day   For cough: honey 1/2 to 1 teaspoon (you can dilute the honey in water or another fluid).  You can also use Mucinex 1200 twice a day  You can use a humidifier for chest congestion and cough.  If you don't have a humidifier, you can sit in the bathroom with the hot shower running.      For sore throat: try warm salt water gargles, cepacol lozenges, throat spray, warm tea or water with lemon/honey, popsicles or ice, or OTC cold relief medicine for throat discomfort.   For congestion: take a daily anti-histamine like Zyrtec, Claritin, and a oral decongestant, such as pseudoephedrine.  You can also use Flonase 1-2 sprays in each nostril daily.   It is important to stay hydrated: drink plenty of fluids (water, gatorade/powerade/pedialyte, juices, or teas) to keep your throat moisturized and help further relieve irritation/discomfort.

## 2021-01-06 NOTE — ED Provider Notes (Signed)
[facility]    CSN: [id number] Arrival date & time: 01/06/21  1017      History   Chief Complaint Chief Complaint  Patient presents with   Migraine    HPI Farrah Skoda is a 60 y.o. female.    Patient presents with fever, chills, nasal congestion, rhinorrhea, diarrhea productive cough, increased shortness of breath and onset frontal headache for 1 day.  Endorses very severe headache with associated nausea and light sensitivity.  History of migraines.  Has not attempted treatment of symptoms.  History of asthma, IBS.  Endorses that symptoms are similar to when she had pneumonia in the past, requesting chest x-ray.  Past Medical History:  Diagnosis Date   ASCUS of cervix with negative high risk HPV 04/2017   Asthma    Endometriosis    IBS (irritable bowel syndrome)    Migraine     Patient Active Problem List   Diagnosis Date Noted   Acute pain of right knee 12/20/2018   Ganglion 05/26/2017   Paronychia of right index finger 05/13/2017   Avulsion fracture of right ankle 03/29/2014   Right shoulder pain 12/05/2013   Sprain of right ankle 12/05/2013   Migraine    Endometriosis    IBS (irritable bowel syndrome)     Past Surgical History:  Procedure Laterality Date   ABDOMINAL HYSTERECTOMY     RSO   CESAREAN SECTION     DILATION AND CURETTAGE OF UTERUS     lower back surgery     "FUSION AND [place]"   OOPHORECTOMY     RSO   PELVIC LAPAROSCOPY     Lysis of adhesions-Endometriosis    OB History     Gravida  2   Para  1   Term  1   Preterm      AB  1   Living  1      SAB  1   IAB      Ectopic      Multiple      Live Births               Home Medications    Prior to Admission medications   Medication Sig Start Date End Date Taking? Authorizing Provider  albuterol (VENTOLIN HFA) 108 (90 Base) MCG/ACT inhaler INHALE 2 PUFFS INTO THE LUNGS EVERY 6 HOURS AS NEEDED FOR WHEEZE OR SHORTNESS OF BREATH 07/29/18   Susy Frizzle, MD  ALPRAZolam Duanne Moron) 1 MG tablet TAKE 1 TABLET BY MOUTH 3 TIMES A DAY AS NEEDED FOR ANXIETY 02/29/20   Susy Frizzle, MD  butalbital-acetaminophen-caffeine (FIORICET) [phone] MG tablet Take 1-2 tablets by mouth every 6 (six) hours as needed for headache. 03/04/20 03/04/21  Susy Frizzle, MD  clonazePAM (KLONOPIN) 0.5 MG tablet TAKE 1 TABLET BY MOUTH THREE TIMES DAILY AS NEEDED FOR ANXIETY 08/15/20   Susy Frizzle, MD  clotrimazole (LOTRIMIN) 1 % cream Apply 1 application topically 2 (two) times daily. To rash on arm 08/30/20   Susy Frizzle, MD  dicyclomine (BENTYL) 20 MG tablet TAKE 1 TABLET BY MOUTH EVERY 6 HOURS AS NEEDED FOR SPASMS. 10/25/20   Susy Frizzle, MD  dicyclomine (BENTYL) 20 MG tablet TAKE 1 TABLET BY MOUTH EVERY 6 HOURS AS NEEDED FOR SPASMS 10/25/20   Susy Frizzle, MD  diphenoxylate-atropine (LOMOTIL) 2.5-0.025 MG tablet Take 2 tablets by mouth 4 (four) times daily as needed for diarrhea or loose stools. 08/16/20   Noemi Chapel  A, NP  escitalopram (LEXAPRO) 10 MG tablet TAKE 1 TABLET BY MOUTH EVERY DAY 10/15/20   Susy Frizzle, MD  estradiol (VIVELLE-DOT) 0.075 MG/24HR PLACE 1 PATCH ONTO THE SKIN 2 (TWO) TIMES A WEEK. 06/14/17   Fontaine, Belinda Block, MD  hydrochlorothiazide (HYDRODIURIL) 25 MG tablet Take 1 tablet (25 mg total) by mouth daily. 05/30/20   Susy Frizzle, MD  hydrocortisone (ANUSOL-HC) 25 MG suppository Place 1 suppository (25 mg total) rectally 2 (two) times daily as needed for hemorrhoids or anal itching. 08/13/20   Eulogio Bear, NP  hydrocortisone 2.5 % cream Apply topically 2 (two) times daily. Apply to hemorrhoid 08/13/20   Susy Frizzle, MD  hyoscyamine (LEVBID) 0.375 MG 12 hr tablet TAKE 1 TABLET BY MOUTH 2 TIMES DAILY. REQUIRES OFFICE VISIT BEFORE ANY FURTHER REFILLS CAN BE GIVEN. 04/11/20   Alycia Rossetti, MD  meloxicam (MOBIC) 15 MG tablet TAKE 1 TABLET BY MOUTH EVERY DAY 01/23/19   Trula Slade, DPM   metoCLOPramide (REGLAN) 10 MG tablet Take 1 tablet (10 mg total) by mouth every 6 (six) hours as needed for nausea (migraine). 03/04/20   Susy Frizzle, MD  Multiple Vitamin (MULTIVITAMIN) tablet Take 1 tablet by mouth daily.    [provider]  mupirocin ointment (BACTROBAN) 2 % Place 1 application into the nose 2 (two) times daily. Apply twice daily to the knee 12/06/18   Aundra Dubin, PA-C  naproxen (NAPROSYN) 500 MG tablet TAKE 1 TABLET BY MOUTH 2 TIMES DAILY WITH A MEAL 04/25/19   Aundra Dubin, PA-C  promethazine (PHENERGAN) 25 MG tablet TAKE 1 TABLET BY MOUTH EVERY 8 HOURS AS NEEDED FOR NAUSEA FOR VOMITING 09/26/20   Susy Frizzle, MD  SUMAtriptan (IMITREX) 100 MG tablet TAKE 1 TABLET IMMEDIATELY FOR MIGRAINE CAN REPEAT 1 ADDITIONAL TABLET IN 2 HRS IF MIGRAINE PERSISTS 07/12/20   Susy Frizzle, MD  traMADol (ULTRAM) 50 MG tablet Take 1-2 tablets (50-100 mg total) by mouth 3 (three) times daily as needed. 05/06/17   Leandrew Koyanagi, MD  valACYclovir (VALTREX) 500 MG tablet Take 1 tablet by mouth once daily 11/21/20   Susy Frizzle, MD  valsartan (DIOVAN) 160 MG tablet Take 1 tablet (160 mg total) by mouth daily. 07/12/20   Susy Frizzle, MD    Family History Family History  Problem Relation Age of Onset   Diabetes Father    Hypertension Father    Hypertension Mother    Dementia Mother     Social History Social History   Tobacco Use   Smoking status: Former   Smokeless tobacco: Never  Scientific laboratory technician Use: Never used  Substance Use Topics   Alcohol use: Yes    Alcohol/week: 5.0 standard drinks    Types: 5 Standard drinks or equivalent per week   Drug use: Never     Allergies   Ampicillin   Review of Systems Review of Systems  Constitutional:  Positive for chills and fever. Negative for activity change, appetite change, diaphoresis, fatigue and unexpected weight change.  HENT:  Positive for congestion and rhinorrhea. Negative for dental  problem, drooling, ear discharge, ear pain, facial swelling, hearing loss, mouth sores, nosebleeds, postnasal drip, sinus pressure, sinus pain, sneezing, sore throat, tinnitus, trouble swallowing and voice change.   Respiratory:  Positive for cough and shortness of breath. Negative for apnea, choking, chest tightness, wheezing and stridor.   Cardiovascular: Negative.   Gastrointestinal:  Positive for  diarrhea and nausea. Negative for abdominal distention, abdominal pain, anal bleeding, blood in stool, constipation, rectal pain and vomiting.  Skin: Negative.   Neurological:  Positive for headaches. Negative for dizziness, tremors, seizures, syncope, facial asymmetry, speech difficulty, weakness, light-headedness and numbness.    Physical Exam Triage Vital Signs ED Triage Vitals  Enc Vitals Group     BP 01/06/21 1057 (!) 186/84     Pulse Rate 01/06/21 1057 77     Resp 01/06/21 1057 20     Temp 01/06/21 1057 99.2 F (37.3 C)     Temp Source 01/06/21 1057 Oral     SpO2 01/06/21 1057 98 %     Weight --      Height --      Head Circumference --      Peak Flow --      Pain Score 01/06/21 1056 10     Pain Loc --      Pain Edu? --      Excl. in [place]? --    No data found.  Updated Vital Signs BP (!) 186/84 (BP Location: Left Arm)   Pulse 77   Temp 99.2 F (37.3 C) (Oral)   Resp 20   SpO2 98%   Visual Acuity Right Eye Distance:   Left Eye Distance:   Bilateral Distance:    Right Eye Near:   Left Eye Near:    Bilateral Near:     Physical Exam Constitutional:      Appearance: Normal appearance. She is normal weight.  HENT:     Head: Normocephalic.     Right Ear: Tympanic membrane, ear canal and external ear normal.     Left Ear: Tympanic membrane, ear canal and external ear normal.     Nose: Congestion and rhinorrhea present.     Mouth/Throat:     Mouth: Mucous membranes are moist.     Pharynx: Posterior oropharyngeal erythema present.  Eyes:     Extraocular Movements:  Extraocular movements intact.  Cardiovascular:     Rate and Rhythm: Normal rate and regular rhythm.     Pulses: Normal pulses.     Heart sounds: Normal heart sounds.  Pulmonary:     Effort: Pulmonary effort is normal.     Breath sounds: Normal breath sounds.  Musculoskeletal:     Cervical back: Normal range of motion and neck supple.  Skin:    General: Skin is warm and dry.  Neurological:     General: No focal deficit present.     Mental Status: She is alert and oriented to person, place, and time. Mental status is at baseline.  Psychiatric:        Mood and Affect: Mood normal.        Behavior: Behavior normal.     UC Treatments / Results  Labs (all labs ordered are listed, but only abnormal results are displayed) Labs Reviewed - No data to display  EKG   Radiology No results found.  Procedures Procedures (including critical care time)  Medications Ordered in UC Medications - No data to display  Initial Impression / Assessment and Plan / UC Course  I have reviewed the triage vital signs and the nursing notes.  Pertinent labs & imaging results that were available during my care of the patient were reviewed by me and considered in my medical decision making (see chart for details).  Influenza-like illness  Symptoms are consistent with department presentation of influenza A, will move forward with treatment  as such, chest x-ray negative, O2 saturation 98% on room air, lungs are clear on exam, discussed findings with patient  1.tamiflu 75 mg bid for 5 days 2. Ibuprofen 800 tid prn 3. Imitrex 50 mg as needed 4.  Toradol 30 mg IM nail, Decadron 10 mg IM now, Reglan 10 mg IM now, Imitrex 6 mg subcu now 5.  Over-the-counter medications for remaining symptom management 6.  Urgent care follow-up as needed Final Clinical Impressions(s) / UC Diagnoses   Final diagnoses:  None   Discharge Instructions   None    ED Prescriptions   None    PDMP not reviewed this  encounter.   Hans Eden, NP 01/06/21 1133

## 2021-01-06 NOTE — ED Triage Notes (Signed)
Had flu couple weeks ago but still having body aches, migraine, congestion. Reports feels like has PNA as she had it before.

## 2021-02-11 NOTE — Telephone Encounter (Signed)
Per Suanne Marker:  She has left msg for patient to return her call---but after reviewing further she found the following:    There were several issues with her attached coverage that needed to be corrected. First, this is a plan which is affiliated with [facility] but is not billed through [facility]. The claims have to go to [employer] directly. I corrected all of that info. Secondly, the ID number portion was correct but the full ID number begins with the letter E which was missing. I corrected that as well. I did find the correct number for [employer] and called to confirm eligibility and benefits as well as mailing address. I have updated all of this and the claims should bill out correctly. Thank you!

## 2021-02-11 NOTE — Telephone Encounter (Signed)
Sent message to [employer]

## 2021-02-12 ENCOUNTER — Other Ambulatory Visit: Payer: Self-pay | Admitting: Family Medicine

## 2021-02-12 NOTE — Telephone Encounter (Signed)
LOV for depression/anxiety 03/21/20 Last refill 08/15/20, #90, 1 refill  Please review, thanks!

## 2021-03-25 ENCOUNTER — Other Ambulatory Visit: Payer: Self-pay | Admitting: Family Medicine

## 2021-03-25 DIAGNOSIS — I10 Essential (primary) hypertension: Secondary | ICD-10-CM

## 2021-03-26 ENCOUNTER — Other Ambulatory Visit: Payer: Self-pay

## 2021-04-01 ENCOUNTER — Other Ambulatory Visit: Payer: Self-pay | Admitting: Family Medicine

## 2021-04-02 NOTE — Telephone Encounter (Signed)
LOV 08/30/20 ?Last refill 02/13/21, #90, 0 refills ? ?Please review, thanks! ?

## 2021-04-25 ENCOUNTER — Other Ambulatory Visit: Payer: Self-pay | Admitting: Family Medicine

## 2021-04-25 DIAGNOSIS — R110 Nausea: Secondary | ICD-10-CM

## 2021-04-25 DIAGNOSIS — I10 Essential (primary) hypertension: Secondary | ICD-10-CM

## 2021-07-10 ENCOUNTER — Other Ambulatory Visit: Payer: Self-pay | Admitting: Family Medicine

## 2021-10-07 ENCOUNTER — Other Ambulatory Visit: Payer: Self-pay | Admitting: Family Medicine

## 2021-10-09 NOTE — Telephone Encounter (Signed)
Courtesy refill. Left message to make appointment. Requested Prescriptions  Pending Prescriptions Disp Refills  . hydrochlorothiazide (HYDRODIURIL) 25 MG tablet [Pharmacy Med Name: hydroCHLOROthiazide 25 MG Oral Tablet] 30 tablet 0    Sig: Take 1 tablet by mouth once daily     Cardiovascular: Diuretics - Thiazide Failed - 10/07/2021  7:17 PM      Failed - Cr in normal range and within 180 days    Creat  Date Value Ref Range Status  08/30/2020 1.66 (H) 0.50 - 1.03 mg/dL Final         Failed - K in normal range and within 180 days    Potassium  Date Value Ref Range Status  08/30/2020 4.6 3.5 - 5.3 mmol/L Final         Failed - Na in normal range and within 180 days    Sodium  Date Value Ref Range Status  08/30/2020 139 135 - 146 mmol/L Final         Failed - Last BP in normal range    BP Readings from Last 1 Encounters:  01/06/21 (!) 186/84         Failed - Valid encounter within last 6 months    Recent Outpatient Visits          1 year ago Diarrhea, unspecified type   [place] Susy Frizzle, MD   1 year ago Diarrhea, unspecified type   [facility] Eulogio Bear, NP   1 year ago Benign essential HTN   [place] Susy Frizzle, MD   1 year ago Current moderate episode of major depressive disorder without prior episode ([place])   [place], Cammie Mcgee, MD   1 year ago Status migrainosus   [place], Cammie Mcgee, MD

## 2021-11-06 ENCOUNTER — Ambulatory Visit: Payer: Self-pay | Admitting: Family Medicine

## 2021-11-10 ENCOUNTER — Ambulatory Visit (INDEPENDENT_AMBULATORY_CARE_PROVIDER_SITE_OTHER): Payer: 59 | Admitting: Family Medicine

## 2021-11-10 VITALS — BP 130/84 | HR 88 | Temp 98.8°F | Ht 64.0 in | Wt 230.0 lb

## 2021-11-10 DIAGNOSIS — G43709 Chronic migraine without aura, not intractable, without status migrainosus: Secondary | ICD-10-CM

## 2021-11-10 DIAGNOSIS — J209 Acute bronchitis, unspecified: Secondary | ICD-10-CM

## 2021-11-10 DIAGNOSIS — I10 Essential (primary) hypertension: Secondary | ICD-10-CM

## 2021-11-10 DIAGNOSIS — F331 Major depressive disorder, recurrent, moderate: Secondary | ICD-10-CM | POA: Diagnosis not present

## 2021-11-10 DIAGNOSIS — R0600 Dyspnea, unspecified: Secondary | ICD-10-CM

## 2021-11-10 DIAGNOSIS — R69 Illness, unspecified: Secondary | ICD-10-CM | POA: Diagnosis not present

## 2021-11-10 LAB — CBC WITH DIFFERENTIAL/PLATELET
Absolute Monocytes: 648 cells/uL (ref 200–950)
Basophils Absolute: 32 cells/uL (ref 0–200)
Basophils Relative: 0.4 %
Eosinophils Absolute: 57 cells/uL (ref 15–500)
Eosinophils Relative: 0.7 %
HCT: 43.4 % (ref 35.0–45.0)
Hemoglobin: 15 g/dL (ref 11.7–15.5)
Lymphs Abs: 1912 cells/uL (ref 850–3900)
MCH: 31.3 pg (ref 27.0–33.0)
MCHC: 34.6 g/dL (ref 32.0–36.0)
MCV: 90.6 fL (ref 80.0–100.0)
MPV: 12.8 fL — ABNORMAL HIGH (ref 7.5–12.5)
Monocytes Relative: 8 %
Neutro Abs: 5451 cells/uL (ref 1500–7800)
Neutrophils Relative %: 67.3 %
Platelets: 205 10*3/uL (ref 140–400)
RBC: 4.79 10*6/uL (ref 3.80–5.10)
RDW: 13.5 % (ref 11.0–15.0)
Total Lymphocyte: 23.6 %
WBC: 8.1 10*3/uL (ref 3.8–10.8)

## 2021-11-10 LAB — COMPLETE METABOLIC PANEL WITH GFR
AG Ratio: 1.7 (calc) (ref 1.0–2.5)
ALT: 23 U/L (ref 6–29)
AST: 22 U/L (ref 10–35)
Albumin: 4.5 g/dL (ref 3.6–5.1)
Alkaline phosphatase (APISO): 74 U/L (ref 37–153)
BUN: 22 mg/dL (ref 7–25)
CO2: 25 mmol/L (ref 20–32)
Calcium: 10.9 mg/dL — ABNORMAL HIGH (ref 8.6–10.4)
Chloride: 100 mmol/L (ref 98–110)
Creat: 0.79 mg/dL (ref 0.50–1.03)
Globulin: 2.7 g/dL (calc) (ref 1.9–3.7)
Glucose, Bld: 108 mg/dL — ABNORMAL HIGH (ref 65–99)
Potassium: 4.1 mmol/L (ref 3.5–5.3)
Sodium: 137 mmol/L (ref 135–146)
Total Bilirubin: 0.4 mg/dL (ref 0.2–1.2)
Total Protein: 7.2 g/dL (ref 6.1–8.1)
eGFR: 86 mL/min/{1.73_m2} (ref >=60)

## 2021-11-10 LAB — LIPID PANEL
Cholesterol: 284 mg/dL — ABNORMAL HIGH (ref <200)
HDL: 74 mg/dL (ref >=50)
LDL Cholesterol (Calc): 162 mg/dL (calc) — ABNORMAL HIGH
Non-HDL Cholesterol (Calc): 210 mg/dL (calc) — ABNORMAL HIGH (ref <130)
Total CHOL/HDL Ratio: 3.8 (calc) (ref <5.0)
Triglycerides: 309 mg/dL — ABNORMAL HIGH (ref <150)

## 2021-11-10 MED ORDER — CLONAZEPAM 0.5 MG PO TABS: 0.5000 mg | ORAL_TABLET | Freq: Three times a day (TID) | ORAL | 0 refills | Status: DC | PRN

## 2021-11-10 MED ORDER — HYDROCHLOROTHIAZIDE 25 MG PO TABS: 25.0000 mg | ORAL_TABLET | Freq: Every day | ORAL | 11 refills | Status: DC

## 2021-11-10 MED ORDER — AIMOVIG 140 MG/ML ~~LOC~~ SOAJ: 140.0000 mg | SUBCUTANEOUS | 5 refills | Status: DC

## 2021-11-10 MED ORDER — VALSARTAN 160 MG PO TABS: 160.0000 mg | ORAL_TABLET | Freq: Every day | ORAL | 3 refills | Status: DC

## 2021-11-10 MED ORDER — ALBUTEROL SULFATE HFA 108 (90 BASE) MCG/ACT IN AERS: INHALATION_SPRAY | RESPIRATORY_TRACT | 11 refills | Status: DC

## 2021-11-10 NOTE — Progress Notes (Signed)
Subjective:    Patient ID: Deborah Jennings, female    DOB: 1960-03-11, 61 y.o.   MRN: [id number]  Hypertension  Patient has not been seen since July of last year.  About 6 months ago, she discontinued Klonopin and stop Lexapro.  Since that time, her mother situation has deteriorated.  Her dementia has progressed to the point that they had to place her in a nursing home/memory care unit.  The stress of the situation and watching her mother decompensate has only exacerbated her depression and anxiety.  Therefore the patient resumed her Lexapro about 2 weeks ago and also started back on her Klonopin however she is still extremely tearful and upset and anxious.  She is on albuterol for asthma.  Today on examination she is wheezing.  However she states that she is short of breath and that she finds herself only getting temporary benefit from the albuterol.  She does not smoke and she is not around secondhand smoke.  She states at times, she wakes up and has to sleep on more pillows because she feels like she is drowning.  Today on examination there are no crackles to suggest pulmonary edema and she is not swelling in her legs but she is wheezing on exam.  She denies any chest pain but does have orthopnea.  She also reports worsening migraines which she attributes to the stress. Past Medical History:  Diagnosis Date   ASCUS of cervix with negative high risk HPV 04/2017   Asthma    Endometriosis    IBS (irritable bowel syndrome)    Migraine    Past Surgical History:  Procedure Laterality Date   ABDOMINAL HYSTERECTOMY     RSO   CESAREAN SECTION     DILATION AND CURETTAGE OF UTERUS     lower back surgery     "FUSION AND DISC REMOVED"   OOPHORECTOMY     RSO   PELVIC LAPAROSCOPY     Lysis of adhesions-Endometriosis   Current Outpatient Medications on File Prior to Visit  Medication Sig Dispense Refill   albuterol (VENTOLIN HFA) 108 (90 Base) MCG/ACT inhaler INHALE 2 PUFFS INTO THE LUNGS EVERY 6  HOURS AS NEEDED FOR WHEEZE OR SHORTNESS OF BREATH 8.5 g 11   clonazePAM (KLONOPIN) 0.5 MG tablet TAKE 1 TABLET BY MOUTH THREE TIMES DAILY AS NEEDED FOR ANXIETY 90 tablet 0   dicyclomine (BENTYL) 20 MG tablet TAKE 1 TABLET BY MOUTH EVERY 6 HOURS AS NEEDED 30 tablet 0   diphenoxylate-atropine (LOMOTIL) 2.5-0.025 MG tablet Take 2 tablets by mouth 4 (four) times daily as needed for diarrhea or loose stools. 30 tablet 0   escitalopram (LEXAPRO) 10 MG tablet Take 1 tablet by mouth once daily 90 tablet 3   estradiol (VIVELLE-DOT) 0.075 MG/24HR PLACE 1 PATCH ONTO THE SKIN 2 (TWO) TIMES A WEEK. 24 patch 3   hydrochlorothiazide (HYDRODIURIL) 25 MG tablet Take 1 tablet by mouth once daily 30 tablet 0   hydrocortisone (ANUSOL-HC) 25 MG suppository Place 1 suppository (25 mg total) rectally 2 (two) times daily as needed for hemorrhoids or anal itching. 12 suppository 0   hydrocortisone 2.5 % cream Apply topically 2 (two) times daily. Apply to hemorrhoid 30 g 0   hyoscyamine (LEVBID) 0.375 MG 12 hr tablet TAKE 1 TABLET BY MOUTH 2 TIMES DAILY. REQUIRES OFFICE VISIT BEFORE ANY FURTHER REFILLS CAN BE GIVEN. 180 tablet 1   ibuprofen (ADVIL) 800 MG tablet Take 1 tablet (800 mg total) by mouth 3 (three)  times daily. 21 tablet 0   meloxicam (MOBIC) 15 MG tablet TAKE 1 TABLET BY MOUTH EVERY DAY 30 tablet 0   metoCLOPramide (REGLAN) 10 MG tablet Take 1 tablet (10 mg total) by mouth every 6 (six) hours as needed for nausea (migraine). 20 tablet 0   Multiple Vitamin (MULTIVITAMIN) tablet Take 1 tablet by mouth daily.     mupirocin ointment (BACTROBAN) 2 % Place 1 application into the nose 2 (two) times daily. Apply twice daily to the knee 22 g 0   naproxen (NAPROSYN) 500 MG tablet TAKE 1 TABLET BY MOUTH 2 TIMES DAILY WITH A MEAL 30 tablet 6   SUMAtriptan (IMITREX) 50 MG tablet Take 1 tablet (50 mg total) by mouth every 2 (two) hours as needed for migraine. May repeat in 2 hours if headache persists or recurs. 10 tablet 0    traMADol (ULTRAM) 50 MG tablet Take 1-2 tablets (50-100 mg total) by mouth 3 (three) times daily as needed. 30 tablet 2   valACYclovir (VALTREX) 500 MG tablet Take 1 tablet by mouth once daily 90 tablet 0   valsartan (DIOVAN) 160 MG tablet Take 1 tablet (160 mg total) by mouth daily. 90 tablet 3   No current facility-administered medications on file prior to visit.     Allergies  Allergen Reactions   Ampicillin Swelling    "PCN is fine"   Social History   Socioeconomic History   Marital status: Married    Spouse name: Not on file   Number of children: Not on file   Years of education: Not on file   Highest education level: Not on file  Occupational History   Not on file  Tobacco Use   Smoking status: Former   Smokeless tobacco: Never  Vaping Use   Vaping Use: Never used  Substance and Sexual Activity   Alcohol use: Yes    Alcohol/week: 5.0 standard drinks of alcohol    Types: 5 Standard drinks or equivalent per week   Drug use: Never   Sexual activity: Yes    Birth control/protection: Surgical    Comment: 1st intercourse 61 yo-More than 5 partners  Other Topics Concern   Not on file  Social History Narrative   Not on file   Social Determinants of Health   Financial Resource Strain: Not on file  Food Insecurity: Not on file  Transportation Needs: Not on file  Physical Activity: Not on file  Stress: Not on file  Social Connections: Not on file  Intimate Partner Violence: Not on file     Review of Systems  All other systems reviewed and are negative.      Objective:   Physical Exam Vitals reviewed.  Constitutional:      General: She is not in acute distress.    Appearance: Normal appearance. She is not ill-appearing or toxic-appearing.  HENT:     Head: Normocephalic and atraumatic.     Right Ear: Tympanic membrane and ear canal normal.     Left Ear: Tympanic membrane and ear canal normal.  Eyes:     Extraocular Movements: Extraocular movements intact.      Pupils: Pupils are equal, round, and reactive to light.  Cardiovascular:     Rate and Rhythm: Normal rate and regular rhythm.     Heart sounds: Normal heart sounds.  Pulmonary:     Effort: Pulmonary effort is normal.     Breath sounds: Wheezing present. No rales.  Musculoskeletal:     Cervical back:  Neck supple. No rigidity.  Lymphadenopathy:     Cervical: No cervical adenopathy.  Neurological:     General: No focal deficit present.     Mental Status: She is alert and oriented to person, place, and time. Mental status is at baseline.     Cranial Nerves: No cranial nerve deficit.     Sensory: No sensory deficit.     Motor: No weakness.     Coordination: Coordination normal.     Gait: Gait normal.           Assessment & Plan:  Benign essential HTN - Plan: CBC with Differential/Platelet, Lipid panel, COMPLETE METABOLIC PANEL WITH GFR, CBC with Differential/Platelet, Lipid panel, COMPLETE METABOLIC PANEL WITH GFR, Brain natriuretic peptide  Dyspnea, unspecified type - Plan: Brain natriuretic peptide  Chronic migraine without aura without status migrainosus, not intractable  Moderate episode of recurrent major depressive disorder (HCC) Patient has numerous problems today and we will try to address each of them.  I will refill her blood pressure medications as her blood pressure is controlled and I will refill the albuterol.  I am concerned that the dyspnea may be either due to worsening asthma or perhaps congestive heart failure given his orthopnea.  I gave the patient breztri samples 2 puffs twice daily.  Reassess in 1 week.  Check a BNP.  If BNP is elevated or if the inhaler is not helping I want to get an echocardiogram to evaluate for congestive heart failure given the orthopnea.  Check a CBC a CMP and a lipid panel.  Start the patient on Mobic 140 mg subcu monthly and reassess in 1 month to see if the migraines are improving.  Allow Lexapro at least another 2 or 3 weeks to take  effect.  I will refill her Klonopin.  Recheck in 1 month or sooner if worsening

## 2021-11-11 ENCOUNTER — Other Ambulatory Visit: Payer: Self-pay | Admitting: Nurse Practitioner

## 2021-11-11 ENCOUNTER — Telehealth: Payer: Self-pay

## 2021-11-11 DIAGNOSIS — E7800 Pure hypercholesterolemia, unspecified: Secondary | ICD-10-CM

## 2021-11-11 DIAGNOSIS — G43709 Chronic migraine without aura, not intractable, without status migrainosus: Secondary | ICD-10-CM

## 2021-11-11 LAB — BRAIN NATRIURETIC PEPTIDE: Brain Natriuretic Peptide: 4 pg/mL (ref <100)

## 2021-11-11 MED ORDER — ROSUVASTATIN CALCIUM 10 MG PO TABS: 10.0000 mg | ORAL_TABLET | Freq: Every day | ORAL | 3 refills | Status: DC

## 2021-11-11 NOTE — Telephone Encounter (Signed)
Spoke w/pt this morning, stated that pt can't afford the Mobic for migrans.   Is there an alternative? Pls advice

## 2021-11-13 MED ORDER — TOPIRAMATE 25 MG PO TABS: 25.0000 mg | ORAL_TABLET | Freq: Two times a day (BID) | ORAL | 0 refills | Status: DC

## 2021-11-13 NOTE — Telephone Encounter (Signed)
Spoke w/pt this afternoon, told pt Dr. Samella Parr recommendations,"Could try topamax 25 mg pobid and in 2 weeks increase to 50 mg pobid. "  Per pt voiced understanding. Aware Rx sent to pharmacy. Nothing further.

## 2021-11-13 NOTE — Addendum Note (Signed)
Addended by: Colman Cater on: 11/13/2021 02:38 PM   Modules accepted: Orders

## 2021-11-25 ENCOUNTER — Ambulatory Visit (HOSPITAL_COMMUNITY)
Admission: EM | Admit: 2021-11-25 | Discharge: 2021-11-25 | Disposition: A | Discharge status: Home / Self Care | Payer: 59 | Attending: Physician Assistant | Admitting: Physician Assistant

## 2021-11-25 ENCOUNTER — Encounter (HOSPITAL_COMMUNITY): Payer: Self-pay

## 2021-11-25 ENCOUNTER — Ambulatory Visit (INDEPENDENT_AMBULATORY_CARE_PROVIDER_SITE_OTHER): Payer: 59

## 2021-11-25 DIAGNOSIS — M7531 Calcific tendinitis of right shoulder: Secondary | ICD-10-CM | POA: Diagnosis not present

## 2021-11-25 DIAGNOSIS — M25511 Pain in right shoulder: Secondary | ICD-10-CM

## 2021-11-25 DIAGNOSIS — M6520 Calcific tendinitis, unspecified site: Secondary | ICD-10-CM

## 2021-11-25 MED ORDER — DICLOFENAC SODIUM 75 MG PO TBEC: 75.0000 mg | DELAYED_RELEASE_TABLET | Freq: Two times a day (BID) | ORAL | 0 refills | Status: DC

## 2021-11-25 MED ORDER — HYDROCODONE-ACETAMINOPHEN 5-325 MG PO TABS: 1.0000 | ORAL_TABLET | ORAL | 0 refills | Status: DC | PRN

## 2021-11-25 NOTE — ED Provider Notes (Signed)
[facility]    CSN: [id number] Arrival date & time: 11/25/21  [place]      History   Chief Complaint Chief Complaint  Patient presents with   Shoulder Pain    HPI Deborah Jennings is a 62 y.o. female.   Patient complains of pain in her right shoulder.  Patient reports that she cannot move shoulder.  Patient denies any previous injury.  Patient complains of throbbing pain  The history is provided by the patient. No language interpreter was used.  Shoulder Pain Location:  Shoulder Shoulder location:  R shoulder Injury: no   Pain details:    Quality:  Aching   Radiates to:  Does not radiate   Severity:  Severe   Onset quality:  Gradual Prior injury to area:  No Relieved by:  Nothing Ineffective treatments:  None tried   Past Medical History:  Diagnosis Date   ASCUS of cervix with negative high risk HPV 04/2017   Asthma    Endometriosis    IBS (irritable bowel syndrome)    Migraine     Patient Active Problem List   Diagnosis Date Noted   Acute pain of right knee 12/20/2018   Ganglion 05/26/2017   Paronychia of right index finger 05/13/2017   Avulsion fracture of right ankle 03/29/2014   Right shoulder pain 12/05/2013   Sprain of right ankle 12/05/2013   Migraine    Endometriosis    IBS (irritable bowel syndrome)     Past Surgical History:  Procedure Laterality Date   ABDOMINAL HYSTERECTOMY     RSO   CESAREAN SECTION     DILATION AND CURETTAGE OF UTERUS     lower back surgery     "FUSION AND [place]"   OOPHORECTOMY     RSO   PELVIC LAPAROSCOPY     Lysis of adhesions-Endometriosis    OB History     Gravida  2   Para  1   Term  1   Preterm      AB  1   Living  1      SAB  1   IAB      Ectopic      Multiple      Live Births               Home Medications    Prior to Admission medications   Medication Sig Start Date End Date Taking? Authorizing Provider  albuterol (VENTOLIN HFA) 108 (90 Base) MCG/ACT inhaler  INHALE 2 PUFFS INTO THE LUNGS EVERY 6 HOURS AS NEEDED FOR WHEEZE OR SHORTNESS OF BREATH 11/10/21  Yes Susy Frizzle, MD  clonazePAM (KLONOPIN) 0.5 MG tablet Take 1 tablet (0.5 mg total) by mouth 3 (three) times daily as needed. for anxiety 11/10/21  Yes Susy Frizzle, MD  dicyclomine (BENTYL) 20 MG tablet TAKE 1 TABLET BY MOUTH EVERY 6 HOURS AS NEEDED 04/25/21  Yes Pickard, Cammie Mcgee, MD  Erenumab-aooe (AIMOVIG) 140 MG/ML SOAJ Inject 140 mg into the skin every 30 (thirty) days. 11/10/21  Yes Susy Frizzle, MD  escitalopram (LEXAPRO) 10 MG tablet Take 1 tablet by mouth once daily 03/26/21  Yes Pickard, Cammie Mcgee, MD  estradiol (VIVELLE-DOT) 0.075 MG/24HR PLACE 1 PATCH ONTO THE SKIN 2 (TWO) TIMES A WEEK. 06/14/17  Yes Fontaine, Belinda Block, MD  hydrochlorothiazide (HYDRODIURIL) 25 MG tablet Take 1 tablet (25 mg total) by mouth daily. 11/10/21  Yes Susy Frizzle, MD  hyoscyamine (LEVBID) 0.375 MG 12  hr tablet TAKE 1 TABLET BY MOUTH 2 TIMES DAILY. REQUIRES OFFICE VISIT BEFORE ANY FURTHER REFILLS CAN BE GIVEN. 04/11/20  Yes Bland, Modena Nunnery, MD  ibuprofen (ADVIL) 800 MG tablet Take 1 tablet (800 mg total) by mouth 3 (three) times daily. 01/06/21  Yes White, Leitha Schuller, NP  meloxicam (MOBIC) 15 MG tablet TAKE 1 TABLET BY MOUTH EVERY DAY 01/23/19  Yes Trula Slade, DPM  metoCLOPramide (REGLAN) 10 MG tablet Take 1 tablet (10 mg total) by mouth every 6 (six) hours as needed for nausea (migraine). 03/04/20  Yes Susy Frizzle, MD  Multiple Vitamin (MULTIVITAMIN) tablet Take 1 tablet by mouth daily.   Yes [provider]  naproxen (NAPROSYN) 500 MG tablet TAKE 1 TABLET BY MOUTH 2 TIMES DAILY WITH A MEAL 04/25/19  Yes Dwana Melena L, PA-C  rosuvastatin (CRESTOR) 10 MG tablet Take 1 tablet (10 mg total) by mouth daily. 11/11/21  Yes Susy Frizzle, MD  SUMAtriptan (IMITREX) 50 MG tablet Take 1 tablet (50 mg total) by mouth every 2 (two) hours as needed for migraine. May repeat in 2 hours if  headache persists or recurs. 01/06/21  Yes White, Leitha Schuller, NP  topiramate (TOPAMAX) 25 MG tablet Take 1 tablet (25 mg total) by mouth 2 (two) times daily. 11/13/21  Yes Susy Frizzle, MD  traMADol (ULTRAM) 50 MG tablet Take 1-2 tablets (50-100 mg total) by mouth 3 (three) times daily as needed. 05/06/17  Yes Leandrew Koyanagi, MD  valACYclovir (VALTREX) 500 MG tablet Take 1 tablet by mouth once daily 11/11/21  Yes Susy Frizzle, MD  valsartan (DIOVAN) 160 MG tablet Take 1 tablet (160 mg total) by mouth daily. 11/10/21  Yes Susy Frizzle, MD  diphenoxylate-atropine (LOMOTIL) 2.5-0.025 MG tablet Take 2 tablets by mouth 4 (four) times daily as needed for diarrhea or loose stools. 08/16/20   Eulogio Bear, NP  hydrocortisone (ANUSOL-HC) 25 MG suppository Place 1 suppository (25 mg total) rectally 2 (two) times daily as needed for hemorrhoids or anal itching. 08/13/20   Eulogio Bear, NP  hydrocortisone 2.5 % cream Apply topically 2 (two) times daily. Apply to hemorrhoid 08/13/20   Susy Frizzle, MD  mupirocin ointment (BACTROBAN) 2 % Place 1 application into the nose 2 (two) times daily. Apply twice daily to the knee 12/06/18   Aundra Dubin, PA-C    Family History Family History  Problem Relation Age of Onset   Diabetes Father    Hypertension Father    Hypertension Mother    Dementia Mother     Social History Social History   Tobacco Use   Smoking status: Former   Smokeless tobacco: Never  Scientific laboratory technician Use: Never used  Substance Use Topics   Alcohol use: Yes    Alcohol/week: 5.0 standard drinks of alcohol    Types: 5 Standard drinks or equivalent per week   Drug use: Never     Allergies   Ampicillin   Review of Systems Review of Systems  All other systems reviewed and are negative.    Physical Exam Triage Vital Signs ED Triage Vitals  Enc Vitals Group     BP 11/25/21 1856 127/79     Pulse Rate 11/25/21 1856 (!) 105     Resp 11/25/21 1856  16     Temp 11/25/21 1856 98.5 F (36.9 C)     Temp Source 11/25/21 1856 Oral     SpO2 11/25/21 1856 95 %  Weight --      Height --      Head Circumference --      Peak Flow --      Pain Score 11/25/21 1854 10     Pain Loc --      Pain Edu? --      Excl. in [place]? --    No data found.  Updated Vital Signs BP 127/79 (BP Location: Left Arm)   Pulse (!) 105   Temp 98.5 F (36.9 C) (Oral)   Resp 16   SpO2 95%   Visual Acuity Right Eye Distance:   Left Eye Distance:   Bilateral Distance:    Right Eye Near:   Left Eye Near:    Bilateral Near:     Physical Exam Vitals and nursing note reviewed.  Constitutional:      Appearance: She is well-developed.  HENT:     Head: Normocephalic.  Pulmonary:     Effort: Pulmonary effort is normal.  Abdominal:     General: There is no distension.  Musculoskeletal:        General: Tenderness present. No swelling or deformity.     Cervical back: Normal range of motion. No tenderness.     Comments: Limited range of motion neurovascular neurosensory are intact  Neurological:     Mental Status: She is alert and oriented to person, place, and time.  Psychiatric:        Mood and Affect: Mood normal.      UC Treatments / Results  Labs (all labs ordered are listed, but only abnormal results are displayed) Labs Reviewed - No data to display  EKG   Radiology DG Shoulder Right  Result Date: 11/25/2021 CLINICAL DATA:  Pain.  No injury EXAM: RIGHT SHOULDER - 2+ VIEW COMPARISON:  Right shoulder 11/27/2013 FINDINGS: Normal alignment no fracture. Shoulder joint normal. AC joint normal. Soft tissue calcification lateral to the greater tuberosity most compatible with calcific tendinitis. This is not seen previously. IMPRESSION: Calcific tendinitis rotator cuff insertion. Electronically Signed   By: Franchot Gallo M.D.   On: 11/25/2021 20:03    Procedures Procedures (including critical care time)  Medications Ordered in UC Medications -  No data to display  Initial Impression / Assessment and Plan / UC Course  I have reviewed the triage vital signs and the nursing notes.  Pertinent labs & imaging results that were available during my care of the patient were reviewed by me and considered in my medical decision making (see chart for details).    MDM: Shoulder x-ray shows civic tendinitis consistent with patient's physical findings.  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 Erlinda Hong in the past patient is advised to call and schedule appointment for follow-up she is given a prescription for hydrocodone and Voltaren declines use of the sling. Final Clinical Impressions(s) / UC Diagnoses   Final diagnoses:  Calcific tendonitis   Discharge Instructions   None    ED Prescriptions     Medication Sig Dispense Auth. Provider   HYDROcodone-acetaminophen (NORCO/VICODIN) 5-325 MG tablet Take 1 tablet by mouth every 4 (four) hours as needed for moderate pain. 20 tablet Asencion Loveday K, [place]   diclofenac (VOLTAREN) 75 MG EC tablet Take 1 tablet (75 mg total) by mouth 2 (two) times daily. 20 tablet Fransico Meadow, [place]      PDMP not reviewed this encounter. An After Visit Summary was printed and given to the patient.  Fransico Meadow, [place] 11/25/21 2022

## 2021-11-25 NOTE — ED Triage Notes (Signed)
No falls or injuries to the right shoulder. Onset Sunday with right shoulder pain. Woke up one morning and could not move it.  States she did normal chores on Sunday but no new activities or heavy lifting.   No medical history with the right shoulder.

## 2021-12-02 ENCOUNTER — Ambulatory Visit: Payer: 59 | Admitting: Orthopaedic Surgery

## 2021-12-11 ENCOUNTER — Other Ambulatory Visit: Payer: Self-pay | Admitting: Family Medicine

## 2021-12-11 ENCOUNTER — Ambulatory Visit: Payer: 59 | Admitting: Family Medicine

## 2021-12-11 DIAGNOSIS — G43709 Chronic migraine without aura, not intractable, without status migrainosus: Secondary | ICD-10-CM

## 2021-12-11 NOTE — Telephone Encounter (Signed)
Requested Prescriptions  Pending Prescriptions Disp Refills   topiramate (TOPAMAX) 25 MG tablet [Pharmacy Med Name: Topiramate 25 MG Oral Tablet] 60 tablet 0    Sig: Take 1 tablet by mouth twice daily     Neurology: Anticonvulsants - topiramate & zonisamide Failed - 12/11/2021  1:20 PM      Failed - Completed PHQ-2 or PHQ-9 in the last 360 days      Failed - Valid encounter within last 12 months    Recent Outpatient Visits           1 year ago Diarrhea, unspecified type   [place] Susy Frizzle, MD   1 year ago Diarrhea, unspecified type   [place] Eulogio Bear, NP   1 year ago Benign essential HTN   [place] Pickard, Cammie Mcgee, MD   1 year ago Current moderate episode of major depressive disorder without prior episode ([place])   [place] Pickard, Cammie Mcgee, MD   1 year ago Status migrainosus   [place] Pickard, Cammie Mcgee, MD       Future Appointments             In 5 days Pickard, Cammie Mcgee, MD [place]            Passed - Cr in normal range and within 360 days    Creat  Date Value Ref Range Status  11/10/2021 0.79 0.50 - 1.03 mg/dL Final         Passed - CO2 in normal range and within 360 days    CO2  Date Value Ref Range Status  11/10/2021 25 20 - 32 mmol/L Final         Passed - ALT in normal range and within 360 days    ALT  Date Value Ref Range Status  11/10/2021 23 6 - 29 U/L Final         Passed - AST in normal range and within 360 days    AST  Date Value Ref Range Status  11/10/2021 22 10 - 35 U/L Final

## 2021-12-16 ENCOUNTER — Ambulatory Visit: Payer: 59 | Admitting: Family Medicine

## 2021-12-16 ENCOUNTER — Encounter: Payer: Self-pay | Admitting: Family Medicine

## 2021-12-16 VITALS — HR 80 | Ht 64.0 in | Wt 231.0 lb

## 2021-12-16 DIAGNOSIS — R110 Nausea: Secondary | ICD-10-CM | POA: Diagnosis not present

## 2021-12-16 DIAGNOSIS — F321 Major depressive disorder, single episode, moderate: Secondary | ICD-10-CM

## 2021-12-16 DIAGNOSIS — R69 Illness, unspecified: Secondary | ICD-10-CM | POA: Diagnosis not present

## 2021-12-16 DIAGNOSIS — G43709 Chronic migraine without aura, not intractable, without status migrainosus: Secondary | ICD-10-CM | POA: Diagnosis not present

## 2021-12-16 MED ORDER — ESCITALOPRAM OXALATE 20 MG PO TABS: 20.0000 mg | ORAL_TABLET | Freq: Every day | ORAL | 5 refills | Status: DC

## 2021-12-16 NOTE — Progress Notes (Signed)
Subjective:    Patient ID: Deborah Jennings, female    DOB: [birth year], 61 y.o.   MRN: [id number]  Hypertension  Cough  Patient has not been seen since July of last year.  About 6 months ago, she discontinued Klonopin and stop Lexapro.  Since that time, her mother situation has deteriorated.  Her dementia has progressed to the point that they had to place her in a nursing home/memory care unit.  The stress of the situation and watching her mother decompensate has only exacerbated her depression and anxiety.  Therefore the patient resumed her Lexapro about 2 weeks ago and also started back on her Klonopin however she is still extremely tearful and upset and anxious.  She is on albuterol for asthma.  Today on examination she is wheezing.  However she states that she is short of breath and that she finds herself only getting temporary benefit from the albuterol.  She does not smoke and she is not around secondhand smoke.  She states at times, she wakes up and has to sleep on more pillows because she feels like she is drowning.  Today on examination there are no crackles to suggest pulmonary edema and she is not swelling in her legs but she is wheezing on exam.  She denies any chest pain but does have orthopnea.  She also reports worsening migraines which she attributes to the stress.  At that time, my plan was:  Patient has numerous problems today and we will try to address each of them.  I will refill her blood pressure medications as her blood pressure is controlled and I will refill the albuterol.  I am concerned that the dyspnea may be either due to worsening asthma or perhaps congestive heart failure given his orthopnea.  I gave the patient breztri samples 2 puffs twice daily.  Reassess in 1 week.  Check a BNP.  If BNP is elevated or if the inhaler is not helping I want to get an echocardiogram to evaluate for congestive heart failure given the orthopnea.  Check a CBC a CMP and a lipid panel.  Start the  patient on aimovig 140 mg subcu monthly and reassess in 1 month to see if the migraines are improving.  Allow Lexapro at least another 2 or 3 weeks to take effect.  I will refill her Klonopin.  Recheck in 1 month or sooner if worsening  12/16/21 Patient is here today for follow-up.  However she is very tearful on exam.  Her mother is now in a nursing home.  She reports excessive feelings of guilt.  She states she feels like she let her mother down.  She is having trouble sleeping.  She states that she has good days and bad days since starting the Lexapro.  She also reports anxiety in addition to the insomnia.  Over the last month she reports feeling nauseated and having diarrhea.  She has a history of IBS and she admits that anxiety exacerbates her IBS.  She also reports epigastric discomfort.  Review of her medication list shows numerous redundant medications.  I explained to the patient that her medication list is "a mess."  It lists naproxen, ibuprofen, meloxicam, diclofenac.  Patient is not sure if she is taking these although she believes she has not.  It also lists bentyl and levsin.  I explained that many of these medications are redundant and can potentially be causing some of her problems.  She is no longer on aimovig due to  cost but is taking Topamax.  The headaches have improved some Past Medical History:  Diagnosis Date   ASCUS of cervix with negative high risk HPV 04/2017   Asthma    Endometriosis    IBS (irritable bowel syndrome)    Migraine    Past Surgical History:  Procedure Laterality Date   ABDOMINAL HYSTERECTOMY     RSO   CESAREAN SECTION     DILATION AND CURETTAGE OF UTERUS     lower back surgery     "FUSION AND DISC REMOVED"   OOPHORECTOMY     RSO   PELVIC LAPAROSCOPY     Lysis of adhesions-Endometriosis   Current Outpatient Medications on File Prior to Visit  Medication Sig Dispense Refill   albuterol (VENTOLIN HFA) 108 (90 Base) MCG/ACT inhaler INHALE 2 PUFFS INTO  THE LUNGS EVERY 6 HOURS AS NEEDED FOR WHEEZE OR SHORTNESS OF BREATH 8.5 g 11   clonazePAM (KLONOPIN) 0.5 MG tablet Take 1 tablet (0.5 mg total) by mouth 3 (three) times daily as needed. for anxiety 90 tablet 0   diclofenac (VOLTAREN) 75 MG EC tablet Take 1 tablet (75 mg total) by mouth 2 (two) times daily. 20 tablet 0   dicyclomine (BENTYL) 20 MG tablet TAKE 1 TABLET BY MOUTH EVERY 6 HOURS AS NEEDED 30 tablet 0   diphenoxylate-atropine (LOMOTIL) 2.5-0.025 MG tablet Take 2 tablets by mouth 4 (four) times daily as needed for diarrhea or loose stools. 30 tablet 0   escitalopram (LEXAPRO) 10 MG tablet Take 1 tablet by mouth once daily 90 tablet 3   estradiol (VIVELLE-DOT) 0.075 MG/24HR PLACE 1 PATCH ONTO THE SKIN 2 (TWO) TIMES A WEEK. 24 patch 3   hydrochlorothiazide (HYDRODIURIL) 25 MG tablet Take 1 tablet (25 mg total) by mouth daily. 30 tablet 11   HYDROcodone-acetaminophen (NORCO/VICODIN) 5-325 MG tablet Take 1 tablet by mouth every 4 (four) hours as needed for moderate pain. 20 tablet 0   hydrocortisone 2.5 % cream Apply topically 2 (two) times daily. Apply to hemorrhoid 30 g 0   hyoscyamine (LEVBID) 0.375 MG 12 hr tablet TAKE 1 TABLET BY MOUTH 2 TIMES DAILY. REQUIRES OFFICE VISIT BEFORE ANY FURTHER REFILLS CAN BE GIVEN. 180 tablet 1   ibuprofen (ADVIL) 800 MG tablet Take 1 tablet (800 mg total) by mouth 3 (three) times daily. 21 tablet 0   meloxicam (MOBIC) 15 MG tablet TAKE 1 TABLET BY MOUTH EVERY DAY 30 tablet 0   metoCLOPramide (REGLAN) 10 MG tablet Take 1 tablet (10 mg total) by mouth every 6 (six) hours as needed for nausea (migraine). 20 tablet 0   Multiple Vitamin (MULTIVITAMIN) tablet Take 1 tablet by mouth daily.     mupirocin ointment (BACTROBAN) 2 % Place 1 application into the nose 2 (two) times daily. Apply twice daily to the knee 22 g 0   naproxen (NAPROSYN) 500 MG tablet TAKE 1 TABLET BY MOUTH 2 TIMES DAILY WITH A MEAL 30 tablet 6   rosuvastatin (CRESTOR) 10 MG tablet Take 1 tablet  (10 mg total) by mouth daily. 90 tablet 3   SUMAtriptan (IMITREX) 50 MG tablet Take 1 tablet (50 mg total) by mouth every 2 (two) hours as needed for migraine. May repeat in 2 hours if headache persists or recurs. 10 tablet 0   topiramate (TOPAMAX) 25 MG tablet Take 1 tablet by mouth twice daily 60 tablet 0   traMADol (ULTRAM) 50 MG tablet Take 1-2 tablets (50-100 mg total) by mouth 3 (three) times daily  as needed. 30 tablet 2   valACYclovir (VALTREX) 500 MG tablet Take 1 tablet by mouth once daily 90 tablet 0   valsartan (DIOVAN) 160 MG tablet Take 1 tablet (160 mg total) by mouth daily. 90 tablet 3   Erenumab-aooe (AIMOVIG) 140 MG/ML SOAJ Inject 140 mg into the skin every 30 (thirty) days. (Patient not taking: Reported on 12/16/2021) 1.12 mL 5   hydrocortisone (ANUSOL-HC) 25 MG suppository Place 1 suppository (25 mg total) rectally 2 (two) times daily as needed for hemorrhoids or anal itching. (Patient not taking: Reported on 12/16/2021) 12 suppository 0   No current facility-administered medications on file prior to visit.     Allergies  Allergen Reactions   Ampicillin Swelling    "PCN is fine"   Social History   Socioeconomic History   Marital status: Married    Spouse name: Not on file   Number of children: Not on file   Years of education: Not on file   Highest education level: Not on file  Occupational History   Not on file  Tobacco Use   Smoking status: Former   Smokeless tobacco: Never  Vaping Use   Vaping Use: Never used  Substance and Sexual Activity   Alcohol use: Yes    Alcohol/week: 5.0 standard drinks of alcohol    Types: 5 Standard drinks or equivalent per week   Drug use: Never   Sexual activity: Yes    Birth control/protection: Surgical    Comment: 1st intercourse 61 yo-More than 5 partners  Other Topics Concern   Not on file  Social History Narrative   Not on file   Social Determinants of Health   Financial Resource Strain: Not on file  Food  Insecurity: Not on file  Transportation Needs: Not on file  Physical Activity: Not on file  Stress: Not on file  Social Connections: Not on file  Intimate Partner Violence: Not on file     Review of Systems  Respiratory:  Positive for cough.   All other systems reviewed and are negative.      Objective:   Physical Exam Vitals reviewed.  Constitutional:      General: She is not in acute distress.    Appearance: Normal appearance. She is not ill-appearing or toxic-appearing.  HENT:     Head: Normocephalic and atraumatic.     Right Ear: Tympanic membrane and ear canal normal.     Left Ear: Tympanic membrane and ear canal normal.  Eyes:     Extraocular Movements: Extraocular movements intact.     Pupils: Pupils are equal, round, and reactive to light.  Cardiovascular:     Rate and Rhythm: Normal rate and regular rhythm.     Heart sounds: Normal heart sounds.  Pulmonary:     Effort: Pulmonary effort is normal.     Breath sounds: Wheezing present. No rales.  Musculoskeletal:     Cervical back: Neck supple. No rigidity.  Lymphadenopathy:     Cervical: No cervical adenopathy.  Neurological:     General: No focal deficit present.     Mental Status: She is alert and oriented to person, place, and time. Mental status is at baseline.     Cranial Nerves: No cranial nerve deficit.     Sensory: No sensory deficit.     Motor: No weakness.     Coordination: Coordination normal.     Gait: Gait normal.           Assessment & Plan:  Chronic migraine without aura without status migrainosus, not intractable  Current moderate episode of major depressive disorder without prior episode (HCC)  Nausea I truly believe that anxiety and depression is playing a role in many of her physical symptoms specifically the diarrhea and nausea.  I want to increase Lexapro to 20 mg a day.  I also want her to call back immediately when she gets home with a medication list so that we can remove many  of these medicines from her list.  I do not want her taking any NSAIDs as these could be causing abdominal pain.  Also do not want her taking Bentyl and hyoscyamine together as this is redundant.  Continue Topamax as it seems to be helping with the headaches.  Reassess in 1 month.  Consider adding a proton pump inhibitor if she is not taking NSAIDs given the epigastric discomfort.  However if she is taking NSAIDs my first plan will be to discontinue those medications and see if the epigastric discomfort improves

## 2021-12-19 ENCOUNTER — Other Ambulatory Visit: Payer: Self-pay

## 2021-12-19 DIAGNOSIS — G43709 Chronic migraine without aura, not intractable, without status migrainosus: Secondary | ICD-10-CM

## 2021-12-19 DIAGNOSIS — R0600 Dyspnea, unspecified: Secondary | ICD-10-CM

## 2021-12-19 DIAGNOSIS — I10 Essential (primary) hypertension: Secondary | ICD-10-CM

## 2021-12-19 DIAGNOSIS — Z780 Asymptomatic menopausal state: Secondary | ICD-10-CM

## 2021-12-19 DIAGNOSIS — K588 Other irritable bowel syndrome: Secondary | ICD-10-CM

## 2021-12-19 DIAGNOSIS — L989 Disorder of the skin and subcutaneous tissue, unspecified: Secondary | ICD-10-CM

## 2021-12-19 DIAGNOSIS — R062 Wheezing: Secondary | ICD-10-CM

## 2021-12-19 MED ORDER — VALSARTAN 160 MG PO TABS: 160.0000 mg | ORAL_TABLET | Freq: Every day | ORAL | 3 refills | Status: DC

## 2021-12-19 MED ORDER — BREZTRI AEROSPHERE 160-9-4.8 MCG/ACT IN AERO: 2.0000 | INHALATION_SPRAY | Freq: Two times a day (BID) | RESPIRATORY_TRACT | 11 refills | Status: DC

## 2021-12-19 MED ORDER — SUMATRIPTAN SUCCINATE 50 MG PO TABS: 50.0000 mg | ORAL_TABLET | ORAL | 0 refills | Status: DC | PRN

## 2021-12-19 MED ORDER — ESTRADIOL 0.075 MG/24HR TD PTTW: 1.0000 | MEDICATED_PATCH | TRANSDERMAL | 3 refills | Status: DC

## 2021-12-19 MED ORDER — HYOSCYAMINE SULFATE ER 0.375 MG PO TB12: ORAL_TABLET | ORAL | 1 refills | Status: DC

## 2021-12-19 MED ORDER — VALACYCLOVIR HCL 500 MG PO TABS: 500.0000 mg | ORAL_TABLET | Freq: Every day | ORAL | 0 refills | Status: DC

## 2022-01-01 ENCOUNTER — Other Ambulatory Visit: Payer: Self-pay

## 2022-01-01 DIAGNOSIS — G43709 Chronic migraine without aura, not intractable, without status migrainosus: Secondary | ICD-10-CM

## 2022-01-01 DIAGNOSIS — L989 Disorder of the skin and subcutaneous tissue, unspecified: Secondary | ICD-10-CM

## 2022-01-01 DIAGNOSIS — I10 Essential (primary) hypertension: Secondary | ICD-10-CM

## 2022-01-01 DIAGNOSIS — R0600 Dyspnea, unspecified: Secondary | ICD-10-CM

## 2022-01-01 DIAGNOSIS — R062 Wheezing: Secondary | ICD-10-CM

## 2022-01-01 DIAGNOSIS — K588 Other irritable bowel syndrome: Secondary | ICD-10-CM

## 2022-01-01 MED ORDER — VALSARTAN 160 MG PO TABS: 160.0000 mg | ORAL_TABLET | Freq: Every day | ORAL | 3 refills | Status: DC

## 2022-01-01 MED ORDER — SUMATRIPTAN SUCCINATE 50 MG PO TABS: 50.0000 mg | ORAL_TABLET | ORAL | 3 refills | Status: DC | PRN

## 2022-01-01 MED ORDER — VALACYCLOVIR HCL 500 MG PO TABS: 500.0000 mg | ORAL_TABLET | Freq: Every day | ORAL | 3 refills | Status: DC

## 2022-01-01 MED ORDER — HYOSCYAMINE SULFATE ER 0.375 MG PO TB12: ORAL_TABLET | ORAL | 3 refills | Status: DC

## 2022-01-01 MED ORDER — BREZTRI AEROSPHERE 160-9-4.8 MCG/ACT IN AERO: 2.0000 | INHALATION_SPRAY | Freq: Two times a day (BID) | RESPIRATORY_TRACT | 11 refills | Status: DC

## 2022-01-15 ENCOUNTER — Telehealth: Payer: Self-pay

## 2022-01-15 NOTE — Telephone Encounter (Signed)
The Prior Authorizations for the patient's Hyoscyamine and Beretzi have been sent to [employer]. Awaiting approval. Mjp,lpn

## 2022-01-29 ENCOUNTER — Ambulatory Visit: Payer: 59 | Admitting: Family Medicine

## 2022-01-29 ENCOUNTER — Encounter: Payer: Self-pay | Admitting: Family Medicine

## 2022-01-29 VITALS — BP 132/84 | HR 94 | Ht 61.0 in | Wt 250.0 lb

## 2022-01-29 DIAGNOSIS — I209 Angina pectoris, unspecified: Secondary | ICD-10-CM

## 2022-01-29 DIAGNOSIS — R0601 Orthopnea: Secondary | ICD-10-CM

## 2022-01-29 DIAGNOSIS — R0602 Shortness of breath: Secondary | ICD-10-CM | POA: Diagnosis not present

## 2022-01-29 DIAGNOSIS — R0789 Other chest pain: Secondary | ICD-10-CM | POA: Diagnosis not present

## 2022-01-29 LAB — BASIC METABOLIC PANEL WITH GFR
BUN: 13 mg/dL (ref 7–25)
CO2: 28 mmol/L (ref 20–32)
Calcium: 9.4 mg/dL (ref 8.6–10.4)
Chloride: 104 mmol/L (ref 98–110)
Creat: 0.8 mg/dL (ref 0.50–1.05)
Glucose, Bld: 159 mg/dL — ABNORMAL HIGH (ref 65–99)
Potassium: 3.4 mmol/L — ABNORMAL LOW (ref 3.5–5.3)
Sodium: 142 mmol/L (ref 135–146)
eGFR: 84 mL/min/{1.73_m2} (ref >=60)

## 2022-01-29 MED ORDER — TRELEGY ELLIPTA 200-62.5-25 MCG/ACT IN AEPB: 1.0000 | INHALATION_SPRAY | Freq: Every day | RESPIRATORY_TRACT | 11 refills | Status: DC

## 2022-01-29 MED ORDER — FUROSEMIDE 40 MG PO TABS: 40.0000 mg | ORAL_TABLET | Freq: Two times a day (BID) | ORAL | 3 refills | Status: DC

## 2022-01-29 MED ORDER — POTASSIUM CHLORIDE CRYS ER 20 MEQ PO TBCR: 20.0000 meq | EXTENDED_RELEASE_TABLET | Freq: Every day | ORAL | 3 refills | Status: DC

## 2022-01-29 MED ORDER — NITROGLYCERIN 0.4 MG SL SUBL: 0.4000 mg | SUBLINGUAL_TABLET | SUBLINGUAL | 3 refills | Status: DC | PRN

## 2022-01-29 NOTE — Progress Notes (Signed)
Subjective:    Patient ID: Deborah Jennings, female    DOB: Jul 25, 1960, 61 y.o.   MRN: [id number] Wt Readings from Last 3 Encounters:  01/29/22 250 lb (113.4 kg)  12/16/21 231 lb (104.8 kg)  11/10/21 230 lb (104.3 kg)   Since I last saw the patient, she has gained almost 20 pounds.  She has pitting edema in both legs.  She reports orthopnea.  She also reports dyspnea on exertion and tightness in her chest when she walks.  EKG today shows normal sinus rhythm with normal intervals and a normal axis and nonspecific T wave changes but no evidence of acute ischemia.  However she has all the symptoms of congestive heart failure.  Her wheezing has improved on Breztri.  However the shortness of breath is still present.  Today on examination she has fine expiratory wheezes and faint bibasilar crackles and +1 pitting edema in both legs.  She also has swelling in her left and right arm  Past Medical History:  Diagnosis Date   ASCUS of cervix with negative high risk HPV 04/2017   Asthma    Endometriosis    IBS (irritable bowel syndrome)    Migraine    Past Surgical History:  Procedure Laterality Date   ABDOMINAL HYSTERECTOMY     RSO   CESAREAN SECTION     DILATION AND CURETTAGE OF UTERUS     lower back surgery     "FUSION AND DISC REMOVED"   OOPHORECTOMY     RSO   PELVIC LAPAROSCOPY     Lysis of adhesions-Endometriosis   Current Outpatient Medications on File Prior to Visit  Medication Sig Dispense Refill   albuterol (VENTOLIN HFA) 108 (90 Base) MCG/ACT inhaler INHALE 2 PUFFS INTO THE LUNGS EVERY 6 HOURS AS NEEDED FOR WHEEZE OR SHORTNESS OF BREATH 8.5 g 11   clonazePAM (KLONOPIN) 0.5 MG tablet Take 1 tablet (0.5 mg total) by mouth 3 (three) times daily as needed. for anxiety 90 tablet 0   diclofenac (VOLTAREN) 75 MG EC tablet Take 1 tablet (75 mg total) by mouth 2 (two) times daily. 20 tablet 0   escitalopram (LEXAPRO) 20 MG tablet Take 1 tablet (20 mg total) by mouth daily. 30 tablet 5    estradiol (VIVELLE-DOT) 0.075 MG/24HR Place 1 patch onto the skin 2 (two) times a week. 24 patch 3   hydrochlorothiazide (HYDRODIURIL) 25 MG tablet Take 1 tablet (25 mg total) by mouth daily. 30 tablet 11   hyoscyamine (LEVBID) 0.375 MG 12 hr tablet TAKE 1 TABLET BY MOUTH 2 TIMES DAILY. 180 tablet 3   Multiple Vitamin (MULTIVITAMIN) tablet Take 1 tablet by mouth daily.     rosuvastatin (CRESTOR) 10 MG tablet Take 1 tablet (10 mg total) by mouth daily. 90 tablet 3   SUMAtriptan (IMITREX) 50 MG tablet Take 1 tablet (50 mg total) by mouth every 2 (two) hours as needed for migraine. May repeat in 2 hours if headache persists or recurs. 10 tablet 3   topiramate (TOPAMAX) 25 MG tablet Take 1 tablet by mouth twice daily 60 tablet 0   valACYclovir (VALTREX) 500 MG tablet Take 1 tablet (500 mg total) by mouth daily. 90 tablet 3   valsartan (DIOVAN) 160 MG tablet Take 1 tablet (160 mg total) by mouth daily. 90 tablet 3   No current facility-administered medications on file prior to visit.     Allergies  Allergen Reactions   Ampicillin Swelling    "PCN is fine"   Social  History   Socioeconomic History   Marital status: Married    Spouse name: Not on file   Number of children: Not on file   Years of education: Not on file   Highest education level: Not on file  Occupational History   Not on file  Tobacco Use   Smoking status: Former   Smokeless tobacco: Never  Vaping Use   Vaping Use: Never used  Substance and Sexual Activity   Alcohol use: Yes    Alcohol/week: 5.0 standard drinks of alcohol    Types: 5 Standard drinks or equivalent per week   Drug use: Never   Sexual activity: Yes    Birth control/protection: Surgical    Comment: 1st intercourse 61 yo-More than 5 partners  Other Topics Concern   Not on file  Social History Narrative   Not on file   Social Determinants of Health   Financial Resource Strain: Not on file  Food Insecurity: Not on file  Transportation Needs: Not on  file  Physical Activity: Not on file  Stress: Not on file  Social Connections: Not on file  Intimate Partner Violence: Not on file     Review of Systems  Respiratory:  Positive for shortness of breath.   All other systems reviewed and are negative.      Objective:   Physical Exam Vitals reviewed.  Constitutional:      General: She is not in acute distress.    Appearance: Normal appearance. She is not ill-appearing or toxic-appearing.  HENT:     Head: Normocephalic and atraumatic.     Right Ear: Tympanic membrane and ear canal normal.     Left Ear: Tympanic membrane and ear canal normal.  Eyes:     Extraocular Movements: Extraocular movements intact.     Pupils: Pupils are equal, round, and reactive to light.  Cardiovascular:     Rate and Rhythm: Normal rate and regular rhythm.     Heart sounds: Normal heart sounds.  Pulmonary:     Effort: Pulmonary effort is normal.     Breath sounds: Examination of the right-lower field reveals rales. Examination of the left-lower field reveals rales. Decreased breath sounds, wheezing and rales present.  Musculoskeletal:     Cervical back: Neck supple. No rigidity.     Right lower leg: Edema present.     Left lower leg: Edema present.  Lymphadenopathy:     Cervical: No cervical adenopathy.  Neurological:     General: No focal deficit present.     Mental Status: She is alert and oriented to person, place, and time. Mental status is at baseline.     Cranial Nerves: No cranial nerve deficit.     Sensory: No sensory deficit.     Motor: No weakness.     Coordination: Coordination normal.     Gait: Gait normal.           Assessment & Plan:  New-onset angina ([place]) - Plan: EKG 64-PPIR, BASIC METABOLIC PANEL WITH GFR, Brain natriuretic peptide, Ambulatory referral to Cardiology  Orthopnea - Plan: Ambulatory referral to Cardiology  Shortness of breath - Plan: Ambulatory referral to Cardiology  EKG shows no evidence of an acute MI.   However I do believe the patient likely has ischemic cardiomyopathy.  Patient reports tightness in her chest with exercise.  I believe this is new onset angina.  We discussed going to the emergency room and she declines.  She states she will go if she develops chest  pain.  I will have the patient's stop hydrochlorothiazide and begin Lasix 40 mg twice daily as I believe she is fluid overloaded due to congestive heart failure causing some of the shortness of breath.  I will consult cardiology as soon as possible as I feel she needs a stress test and echocardiogram.  I gave the patient nitroglycerin with instructions on when to take the nitroglycerin.  If she takes nitroglycerin she is to call 911 immediately.  Discontinue Imitrex.  Recheck next week or seek medical attention immediately if worsening

## 2022-01-30 LAB — BRAIN NATRIURETIC PEPTIDE: Brain Natriuretic Peptide: 44 pg/mL (ref <100)

## 2022-02-03 ENCOUNTER — Ambulatory Visit: Payer: 59 | Admitting: Family Medicine

## 2022-02-03 NOTE — Progress Notes (Unsigned)
Cardiology Office Note:    Date:  02/04/2022   ID:  Deborah Jennings, DOB January 05, 1961, MRN [id number]  PCP:  Susy Frizzle, MD   [facility] Providers Cardiologist:  Lenna Sciara, MD Referring MD: Susy Frizzle, MD   Chief Complaint/Reason for Referral:  Chest discomfort, orthopnea  ASSESSMENT:    1. Dyspnea on exertion   2. Precordial pain   3. Primary hypertension   4. Dyslipidemia   5. BMI 45.0-49.9, adult ([place])     PLAN:    In order of problems listed above: 1.  Dyspnea: Will obtain an echocardiogram to evaluate further.  Continue losartan.  I suspect she has diastolic dysfunction.  Based on the echocardiogram we may start an SGLT2 inhibitor and a beta-blocker as long as it does not exacerbate her asthma. 2.  Chest pain: This is likely due to high LVEDP.  Will discontinue nitroglycerin.  Continue Lasix for now. 3.  Hypertension:  BP is well controlled today.  Continue losartan. 4.  Dyslipidemia: This is being followed by the patient's primary care provider. 5.  Elevated BMI: We will refer to pharmacy for recommendations.              Dispo:  No follow-ups on file.      Medication Adjustments/Labs and Tests Ordered: Current medicines are reviewed at length with the patient today.  Concerns regarding medicines are outlined above.  The following changes have been made:  no change   Labs/tests ordered: Orders Placed This Encounter  Procedures   AMB Referral to [facility]   ECHOCARDIOGRAM COMPLETE    Medication Changes: No orders of the defined types were placed in this encounter.    Current medicines are reviewed at length with the patient today.  The patient does not have concerns regarding medicines.   History of Present Illness:    FOCUSED PROBLEM LIST:   1.  Hyperlipidemia 2.  Hypertension  3.  Asthma 4.  BMI of 47  The patient is a 62 y.o. female with the indicated medical history here for recommendations regarding shortness  of breath and chest discomfort.  The patient was seen by her primary care provider recently due to presence of also started on Lasix 40 mg twice daily.  Imitrex was discontinued and hydrochlorothiazide was also stopped.  She tells me she had gained about 20 pounds worth of water weight over the last several months.  After starting Lasix she lost all that weight.  She feels much better.  She denies any exertional angina, orthopnea, or severe shortness of breath.  She occasionally wheezes and uses her albuterol inhaler.  She feels much better versus a few weeks ago after starting Lasix.  She tells me she started taking care of her mother who was declining in health over the last 7 years.  Recently she had to be placed in a facility and this is really depressed her.  Over the last several years she has not been able to exercise or take care of herself.  She used to smoke but quit about 30 years ago.  She has a 30-pack-year smoking history.         Current Medications: Current Meds  Medication Sig   albuterol (VENTOLIN HFA) 108 (90 Base) MCG/ACT inhaler INHALE 2 PUFFS INTO THE LUNGS EVERY 6 HOURS AS NEEDED FOR WHEEZE OR SHORTNESS OF BREATH   clonazePAM (KLONOPIN) 0.5 MG tablet Take 1 tablet (0.5 mg total) by mouth 3 (three) times daily as needed. for anxiety  diclofenac (VOLTAREN) 75 MG EC tablet Take 1 tablet (75 mg total) by mouth 2 (two) times daily.   escitalopram (LEXAPRO) 20 MG tablet Take 1 tablet (20 mg total) by mouth daily.   estradiol (VIVELLE-DOT) 0.075 MG/24HR Place 1 patch onto the skin 2 (two) times a week.   Fluticasone-Umeclidin-Vilant (TRELEGY ELLIPTA) 200-62.5-25 MCG/ACT AEPB Inhale 1 Inhalation into the lungs daily at 6 (six) AM.   furosemide (LASIX) 40 MG tablet Take 1 tablet (40 mg total) by mouth 2 (two) times daily.   hyoscyamine (LEVBID) 0.375 MG 12 hr tablet TAKE 1 TABLET BY MOUTH 2 TIMES DAILY.   Multiple Vitamin (MULTIVITAMIN) tablet Take 1 tablet by mouth daily.    potassium chloride SA (KLOR-CON M) 20 MEQ tablet Take 1 tablet (20 mEq total) by mouth daily.   rosuvastatin (CRESTOR) 10 MG tablet Take 1 tablet (10 mg total) by mouth daily.   topiramate (TOPAMAX) 25 MG tablet Take 1 tablet by mouth twice daily   valACYclovir (VALTREX) 500 MG tablet Take 1 tablet (500 mg total) by mouth daily.   valsartan (DIOVAN) 160 MG tablet Take 1 tablet (160 mg total) by mouth daily.   [DISCONTINUED] nitroGLYCERIN (NITROSTAT) 0.4 MG SL tablet Place 1 tablet (0.4 mg total) under the tongue every 5 (five) minutes as needed for chest pain.     Allergies:    Ampicillin   Social History:   Social History   Tobacco Use   Smoking status: Former   Smokeless tobacco: Never  Scientific laboratory technician Use: Never used  Substance Use Topics   Alcohol use: Yes    Alcohol/week: 5.0 standard drinks of alcohol    Types: 5 Standard drinks or equivalent per week   Drug use: Never     Family Hx: Family History  Problem Relation Age of Onset   Diabetes Father    Hypertension Father    Hypertension Mother    Dementia Mother      Review of Systems:   Please see the history of present illness.    All other systems reviewed and are negative.     EKGs/Labs/Other Test Reviewed:    EKG:  EKG performed December 2023 that I personally reviewed demonstrates sinus rhythm with left atrial enlargement nonspecific ST and T wave changes with poor R wave progression  Prior CV studies: None available  Other studies Reviewed: Review of the additional studies/records demonstrates: Imaging evidence of aortic atherosclerosis and coronary artery calcification available  Recent Labs: 11/10/2021: ALT 23; Hemoglobin 15.0; Platelets 205 01/29/2022: Brain Natriuretic Peptide 44; BUN 13; Creat 0.80; Potassium 3.4; Sodium 142   Recent Lipid Panel Lab Results  Component Value Date/Time   CHOL 284 (H) 11/10/2021 01:05 PM   TRIG 309 (H) 11/10/2021 01:05 PM   HDL 74 11/10/2021 01:05 PM    LDLCALC 162 (H) 11/10/2021 01:05 PM    Risk Assessment/Calculations:                Physical Exam:    VS:  BP 138/84   Pulse 78   Ht '5\' 2"'$  (1.575 m)   Wt 234 lb (106.1 kg)   SpO2 98%   BMI 42.80 kg/m    Wt Readings from Last 3 Encounters:  02/04/22 234 lb (106.1 kg)  01/29/22 250 lb (113.4 kg)  12/16/21 231 lb (104.8 kg)    GENERAL:  No apparent distress, AOx3 HEENT:  No carotid bruits, +2 carotid impulses, no scleral icterus CAR: RRR no murmurs, gallops, rubs,  or thrills RES:  Clear to auscultation bilaterally ABD:  Soft, nontender, nondistended, positive bowel sounds x 4 VASC:  +2 radial pulses, +2 carotid pulses, palpable pedal pulses NEURO:  CN 2-12 grossly intact; motor and sensory grossly intact PSYCH:  No active depression or anxiety EXT:  No edema, ecchymosis, or cyanosis  Signed, Early Osmond, MD  02/04/2022 10:32 AM    [facility] [address], [place], [place]  [address] Phone: [phone]; Fax: [phone]   Note:  This document was prepared using Dragon voice recognition software and may include unintentional dictation errors.

## 2022-02-04 ENCOUNTER — Encounter: Payer: Self-pay | Admitting: Internal Medicine

## 2022-02-04 ENCOUNTER — Ambulatory Visit: Payer: 59 | Attending: Internal Medicine | Admitting: Internal Medicine

## 2022-02-04 VITALS — BP 138/84 | HR 78 | Ht 62.0 in | Wt 234.0 lb

## 2022-02-04 DIAGNOSIS — R0609 Other forms of dyspnea: Secondary | ICD-10-CM

## 2022-02-04 DIAGNOSIS — Z6841 Body Mass Index (BMI) 40.0 and over, adult: Secondary | ICD-10-CM

## 2022-02-04 DIAGNOSIS — I10 Essential (primary) hypertension: Secondary | ICD-10-CM

## 2022-02-04 DIAGNOSIS — E785 Hyperlipidemia, unspecified: Secondary | ICD-10-CM | POA: Diagnosis not present

## 2022-02-04 DIAGNOSIS — R072 Precordial pain: Secondary | ICD-10-CM

## 2022-02-04 NOTE — Patient Instructions (Signed)
Medication Instructions:  Stop nitroglycerin  *If you need a refill on your cardiac medications before your next appointment, please call your pharmacy*   Lab Work: None   Testing/Procedures: Your physician has requested that you have an echocardiogram. Echocardiography is a painless test that uses sound waves to create images of your heart. It provides your doctor with information about the size and shape of your heart and how well your heart's chambers and valves are working. This procedure takes approximately one hour. There are no restrictions for this procedure. Please do NOT wear cologne, perfume, aftershave, or lotions (deodorant is allowed). Please arrive 15 minutes prior to your appointment time.   Follow-Up: At [facility], you and your health needs are our priority.  As part of our continuing mission to provide you with exceptional heart care, we have created designated Provider Care Teams.  These Care Teams include your primary Cardiologist (physician) and Advanced Practice Providers (APPs -  Physician Assistants and Nurse Practitioners) who all work together to provide you with the care you need, when you need it.   Your next appointment:   6 month(s)  The format for your next appointment:   In Person  Provider:   Early Osmond, MD     Other Instructions You have been referred to our Clinical Pharmacy Team for elevated BMI   Important Information About Sugar

## 2022-02-05 ENCOUNTER — Telehealth: Payer: Self-pay | Admitting: Family Medicine

## 2022-02-05 ENCOUNTER — Ambulatory Visit: Payer: 59 | Admitting: Family Medicine

## 2022-02-05 NOTE — Telephone Encounter (Signed)
Patient called to cancel today's appointment. She saw her cardiologist who didn't want to do her stress test yesterday. Cardiologist wants the patient to have a ultrasound of the heart on 02/26/22. Patient will follow up awith [place] afterwards.   Patient also wanted to report she's gone down to 233 lbs; lasix is working well for her.   Please advise with any questions at [phone].

## 2022-02-07 ENCOUNTER — Other Ambulatory Visit: Payer: Self-pay | Admitting: Family Medicine

## 2022-02-07 DIAGNOSIS — I10 Essential (primary) hypertension: Secondary | ICD-10-CM

## 2022-02-09 NOTE — Telephone Encounter (Signed)
Requested Prescriptions  Pending Prescriptions Disp Refills   dicyclomine (BENTYL) 20 MG tablet [Pharmacy Med Name: Dicyclomine HCl 20 MG Oral Tablet] 30 tablet 0    Sig: TAKE 1 TABLET BY MOUTH EVERY 6 HOURS AS NEEDED     Gastroenterology:  Antispasmodic Agents Failed - 02/07/2022  7:57 PM      Failed - Valid encounter within last 12 months    Recent Outpatient Visits           1 year ago Diarrhea, unspecified type   [place] Susy Frizzle, MD   1 year ago Diarrhea, unspecified type   [place] Eulogio Bear, NP   1 year ago Benign essential HTN   [place] Susy Frizzle, MD   1 year ago Current moderate episode of major depressive disorder without prior episode ([place])   [place] Pickard, Cammie Mcgee, MD   1 year ago Status migrainosus   [place] Pickard, Cammie Mcgee, MD       Future Appointments             In 1 month [place]. [facility], [facility]

## 2022-02-12 ENCOUNTER — Other Ambulatory Visit: Payer: Self-pay | Admitting: Family Medicine

## 2022-02-12 DIAGNOSIS — I10 Essential (primary) hypertension: Secondary | ICD-10-CM

## 2022-02-12 NOTE — Telephone Encounter (Signed)
Requested medication (s) are due for refill today - no  Requested medication (s) are on the active medication list -no  Future visit scheduled -no  Last refill: medication no longer on current medication list  Notes to clinic:  medication no longer on current  medication list  Requested Prescriptions  Pending Prescriptions Disp Refills   dicyclomine (BENTYL) 20 MG tablet [Pharmacy Med Name: Dicyclomine HCl 20 MG Oral Tablet] 30 tablet 0    Sig: TAKE 1 TABLET BY MOUTH EVERY 6 HOURS AS NEEDED     Gastroenterology:  Antispasmodic Agents Failed - 02/12/2022  3:22 PM      Failed - Valid encounter within last 12 months    Recent Outpatient Visits           1 year ago Diarrhea, unspecified type   [place] Susy Frizzle, MD   1 year ago Diarrhea, unspecified type   [facility] Eulogio Bear, NP   1 year ago Benign essential HTN   [place] Susy Frizzle, MD   1 year ago Current moderate episode of major depressive disorder without prior episode ([place])   [place], Cammie Mcgee, MD   1 year ago Status migrainosus   [place] Pickard, Cammie Mcgee, MD       Future Appointments             In 4 weeks [place]. [facility], [facility]                Requested Prescriptions  Pending Prescriptions Disp Refills   dicyclomine (BENTYL) 20 MG tablet [Pharmacy Med Name: Dicyclomine HCl 20 MG Oral Tablet] 30 tablet 0    Sig: TAKE 1 TABLET BY MOUTH EVERY 6 HOURS AS NEEDED     Gastroenterology:  Antispasmodic Agents Failed - 02/12/2022  3:22 PM      Failed - Valid encounter within last 12 months    Recent Outpatient Visits           1 year ago Diarrhea, unspecified type   [place] Susy Frizzle, MD   1 year ago Diarrhea, unspecified type   [place] Eulogio Bear, NP   1 year  ago Benign essential HTN   [place] Susy Frizzle, MD   1 year ago Current moderate episode of major depressive disorder without prior episode ([place])   [facility] Pickard, Cammie Mcgee, MD   1 year ago Status migrainosus   [place] Pickard, Cammie Mcgee, MD       Future Appointments             In 4 weeks [place]. [facility], [facility]

## 2022-02-17 ENCOUNTER — Ambulatory Visit: Payer: 59 | Admitting: Cardiology

## 2022-02-24 ENCOUNTER — Other Ambulatory Visit: Payer: Self-pay | Admitting: Family Medicine

## 2022-02-24 DIAGNOSIS — I10 Essential (primary) hypertension: Secondary | ICD-10-CM

## 2022-02-26 ENCOUNTER — Ambulatory Visit (HOSPITAL_COMMUNITY): Payer: 59 | Attending: Internal Medicine

## 2022-02-26 DIAGNOSIS — R072 Precordial pain: Secondary | ICD-10-CM | POA: Insufficient documentation

## 2022-02-26 DIAGNOSIS — I10 Essential (primary) hypertension: Secondary | ICD-10-CM | POA: Diagnosis not present

## 2022-02-26 DIAGNOSIS — R0609 Other forms of dyspnea: Secondary | ICD-10-CM | POA: Insufficient documentation

## 2022-02-26 LAB — ECHOCARDIOGRAM COMPLETE
Area-P 1/2: 3.34 cm2
S' Lateral: 2.8 cm

## 2022-02-26 MED ORDER — PERFLUTREN LIPID MICROSPHERE
3.0000 mL | INTRAVENOUS | Status: AC | PRN
  Administered 2022-02-26: 3 mL via INTRAVENOUS

## 2022-03-03 ENCOUNTER — Other Ambulatory Visit: Payer: Self-pay | Admitting: Family Medicine

## 2022-03-03 DIAGNOSIS — I10 Essential (primary) hypertension: Secondary | ICD-10-CM

## 2022-03-04 NOTE — Telephone Encounter (Signed)
Requested Prescriptions  Pending Prescriptions Disp Refills   dicyclomine (BENTYL) 20 MG tablet [Pharmacy Med Name: Dicyclomine HCl 20 MG Oral Tablet] 30 tablet 0    Sig: TAKE 1 TABLET BY MOUTH EVERY 6 HOURS AS NEEDED     Gastroenterology:  Antispasmodic Agents Failed - 03/03/2022  3:14 PM      Failed - Valid encounter within last 12 months    Recent Outpatient Visits           1 year ago Diarrhea, unspecified type   [place] Susy Frizzle, MD   1 year ago Diarrhea, unspecified type   [place] Eulogio Bear, NP   1 year ago Benign essential HTN   [place] Susy Frizzle, MD   1 year ago Current moderate episode of major depressive disorder without prior episode ([place])   [facility] Pickard, Cammie Mcgee, MD   2 years ago Status migrainosus   [place] Pickard, Cammie Mcgee, MD       Future Appointments             In 1 week [place] at [facility], [place]

## 2022-03-05 ENCOUNTER — Other Ambulatory Visit: Payer: Self-pay

## 2022-03-05 DIAGNOSIS — K588 Other irritable bowel syndrome: Secondary | ICD-10-CM

## 2022-03-05 MED ORDER — HYOSCYAMINE SULFATE ER 0.375 MG PO TB12: ORAL_TABLET | ORAL | 3 refills | Status: DC

## 2022-03-06 ENCOUNTER — Telehealth: Payer: Self-pay

## 2022-03-06 ENCOUNTER — Other Ambulatory Visit: Payer: Self-pay

## 2022-03-06 DIAGNOSIS — I10 Essential (primary) hypertension: Secondary | ICD-10-CM

## 2022-03-06 MED ORDER — DICYCLOMINE HCL 20 MG PO TABS: 20.0000 mg | ORAL_TABLET | Freq: Four times a day (QID) | ORAL | 3 refills | Status: DC | PRN

## 2022-03-06 NOTE — Telephone Encounter (Signed)
Pt called stating that the generic of Levbid, with her insurance is $78.00 per month. Pt asks if there is another medication that she can try, that may be cheaper? Pt states she has used Levsin in the past also. Thank you.    Pt called back and states she has used Dicyclomine before and thinks it is cheaper. Pt asks if Rx for that can be sent in instead?

## 2022-03-06 NOTE — Telephone Encounter (Signed)
Pt called stating that the generic of Levbid, with her insurance is $78.00 per month. Pt asks if there is another medication that she can try, that may be cheaper? Pt states she has used Levsin in the past also. Thank you.

## 2022-03-09 ENCOUNTER — Other Ambulatory Visit: Payer: Self-pay | Admitting: Family Medicine

## 2022-03-10 NOTE — Telephone Encounter (Signed)
Requested medications are due for refill today.  Provider to determine  Requested medications are on the active medications list.  yes  Last refill. 11/10/2021 #90 0 rf  Future visit scheduled.   no  Notes to clinic.  Refill not delegated.    Requested Prescriptions  Pending Prescriptions Disp Refills   clonazePAM (KLONOPIN) 0.5 MG tablet [Pharmacy Med Name: clonazePAM 0.5 MG Oral Tablet] 90 tablet 0    Sig: TAKE 1 TABLET BY MOUTH THREE TIMES DAILY AS NEEDED FOR ANXIETY     Not Delegated - Psychiatry: Anxiolytics/Hypnotics 2 Failed - 03/09/2022  6:47 PM      Failed - This refill cannot be delegated      Failed - Urine Drug Screen completed in last 360 days      Failed - Valid encounter within last 6 months    Recent Outpatient Visits           1 year ago Diarrhea, unspecified type   [place] Susy Frizzle, MD   1 year ago Diarrhea, unspecified type   [facility] Eulogio Bear, NP   1 year ago Benign essential HTN   [place] Susy Frizzle, MD   1 year ago Current moderate episode of major depressive disorder without prior episode ([place])   [facility] Pickard, Cammie Mcgee, MD   2 years ago Status migrainosus   [place] Pickard, Cammie Mcgee, MD       Future Appointments             In 2 days [place] at [facility], [place] - Patient is not pregnant

## 2022-03-11 ENCOUNTER — Telehealth: Payer: Self-pay | Admitting: Internal Medicine

## 2022-03-11 NOTE — Telephone Encounter (Signed)
Patient called wanting results to her echo.

## 2022-03-11 NOTE — Telephone Encounter (Signed)
Returned call to patient.  Discussed echo results per Dr. Ali Lowe: Your heart muscle is strong without valve problems which is good news.   Patient verbalized understanding and expressed appreciation for call.

## 2022-03-12 ENCOUNTER — Ambulatory Visit: Payer: 59

## 2022-04-08 ENCOUNTER — Other Ambulatory Visit: Payer: Self-pay | Admitting: Family Medicine

## 2022-04-08 DIAGNOSIS — G43709 Chronic migraine without aura, not intractable, without status migrainosus: Secondary | ICD-10-CM

## 2022-04-20 ENCOUNTER — Ambulatory Visit: Payer: 59 | Admitting: Family Medicine

## 2022-04-21 ENCOUNTER — Ambulatory Visit: Payer: 59 | Admitting: Family Medicine

## 2022-04-21 ENCOUNTER — Ambulatory Visit: Payer: 59

## 2022-04-23 ENCOUNTER — Ambulatory Visit: Payer: 59 | Admitting: Family Medicine

## 2022-04-23 ENCOUNTER — Ambulatory Visit: Payer: 59 | Attending: Internal Medicine

## 2022-04-23 ENCOUNTER — Encounter: Payer: Self-pay | Admitting: Family Medicine

## 2022-04-23 VITALS — BP 124/72 | HR 100 | Ht 62.0 in | Wt 234.0 lb

## 2022-04-23 DIAGNOSIS — L723 Sebaceous cyst: Secondary | ICD-10-CM | POA: Diagnosis not present

## 2022-04-23 DIAGNOSIS — G43709 Chronic migraine without aura, not intractable, without status migrainosus: Secondary | ICD-10-CM

## 2022-04-23 MED ORDER — GABAPENTIN 300 MG PO CAPS: 300.0000 mg | ORAL_CAPSULE | Freq: Two times a day (BID) | ORAL | 3 refills | Status: DC

## 2022-04-23 MED ORDER — SUMATRIPTAN SUCCINATE 100 MG PO TABS: 100.0000 mg | ORAL_TABLET | ORAL | 0 refills | Status: DC | PRN

## 2022-04-23 NOTE — Progress Notes (Unsigned)
Patient ID: [name]                 DOB: 1960-02-07                    MRN: [id number]      HPI: Deborah Jennings is a 62 y.o. female patient referred to pharmacy clinic by Dr. Ali Lowe to initiate weight loss therapy with GLP1-RA. PMH is significant for obesity complicated by chronic medical conditions including Dyslipidemia. Most recent BMI 42.79, previous BMI 47.26.  *** Follow-up visit  Assess % weight loss Assess adverse effects Missed doses  Current weight management medications:   Previously tried meds:  Current meds that may affect weight:  Baseline weight/BMI:  Insurance payor:   Diet:  -Breakfast: -Lunch: -Dinner: -Snacks: -Drinks:  Exercise:   Family History:   Social History:   Labs: No results found for: "HGBA1C"  Wt Readings from Last 1 Encounters:  02/04/22 234 lb (106.1 kg)    BP Readings from Last 1 Encounters:  02/04/22 138/84   Pulse Readings from Last 1 Encounters:  02/04/22 78       Component Value Date/Time   CHOL 284 (H) 11/10/2021 1305   TRIG 309 (H) 11/10/2021 1305   HDL 74 11/10/2021 1305   CHOLHDL 3.8 11/10/2021 1305   VLDL 45 (H) 04/23/2016 0831   LDLCALC 162 (H) 11/10/2021 1305    Past Medical History:  Diagnosis Date   ASCUS of cervix with negative high risk HPV 04/2017   Asthma    Endometriosis    IBS (irritable bowel syndrome)    Migraine     Current Outpatient Medications on File Prior to Visit  Medication Sig Dispense Refill   albuterol (VENTOLIN HFA) 108 (90 Base) MCG/ACT inhaler INHALE 2 PUFFS INTO THE LUNGS EVERY 6 HOURS AS NEEDED FOR WHEEZE OR SHORTNESS OF BREATH 8.5 g 11   clonazePAM (KLONOPIN) 0.5 MG tablet TAKE 1 TABLET BY MOUTH THREE TIMES DAILY AS NEEDED FOR ANXIETY 90 tablet 0   diclofenac (VOLTAREN) 75 MG EC tablet Take 1 tablet (75 mg total) by mouth 2 (two) times daily. 20 tablet 0   dicyclomine (BENTYL) 20 MG tablet Take 1 tablet (20 mg total) by mouth every 6 (six) hours as needed. 30 tablet  3   escitalopram (LEXAPRO) 20 MG tablet Take 1 tablet (20 mg total) by mouth daily. 30 tablet 5   estradiol (VIVELLE-DOT) 0.075 MG/24HR Place 1 patch onto the skin 2 (two) times a week. 24 patch 3   Fluticasone-Umeclidin-Vilant (TRELEGY ELLIPTA) 200-62.5-25 MCG/ACT AEPB Inhale 1 Inhalation into the lungs daily at 6 (six) AM. 1 each 11   furosemide (LASIX) 40 MG tablet Take 1 tablet (40 mg total) by mouth 2 (two) times daily. 60 tablet 3   Multiple Vitamin (MULTIVITAMIN) tablet Take 1 tablet by mouth daily.     potassium chloride SA (KLOR-CON M) 20 MEQ tablet Take 1 tablet (20 mEq total) by mouth daily. 30 tablet 3   rosuvastatin (CRESTOR) 10 MG tablet Take 1 tablet (10 mg total) by mouth daily. 90 tablet 3   topiramate (TOPAMAX) 25 MG tablet Take 1 tablet by mouth twice daily 60 tablet 0   valACYclovir (VALTREX) 500 MG tablet Take 1 tablet (500 mg total) by mouth daily. 90 tablet 3   valsartan (DIOVAN) 160 MG tablet Take 1 tablet (160 mg total) by mouth daily. 90 tablet 3   No current facility-administered medications on file prior to visit.  Allergies  Allergen Reactions   Ampicillin Swelling    "PCN is fine"     Assessment/Plan:  1. Weight loss - Patient has not met goal of at least 5% of body weight loss with comprehensive lifestyle modifications alone in the past 3-6 months. Pharmacotherapy is appropriate to pursue as augmentation. Will start ***. Confirmed patient not ***pregnant and no personal or family history of medullary thyroid carcinoma (MTC) or Multiple Endocrine Neoplasia syndrome type 2 (MEN 2).   Advised patient on common side effects including nausea, diarrhea, dyspepsia, decreased appetite, and fatigue. Counseled patient on reducing meal size and how to titrate medication to minimize side effects. Counseled patient to call if intolerable side effects or if experiencing dehydration, abdominal pain, or dizziness. Patient will adhere to dietary modifications and will target  at least 150 minutes of moderate intensity exercise weekly.   Follow up in ***.

## 2022-04-23 NOTE — Progress Notes (Signed)
Subjective:    Patient ID: Deborah Jennings, female    DOB: 11-07-1960, 62 y.o.   MRN: [id number] Patient has a history of chronic migraines.  She is currently on Topamax for prevention.  She states that the Topamax is not helping.  I recently stopped her Imitrex when I was concerned about possible angina.  She has since been seen by cardiology and cleared.  However she has not resumed her Imitrex.  She is reporting daily headaches.  She believes the headaches are related to a mass on the back of her neck.  The mass on her neck appears to be a large sebaceous cyst.  It is located directly above the cervical spine.  It is freely mobile soft and spongy.  I believe it is a sebaceous cyst.  There is also an adjacent lipoma that is roughly 3 cm in diameter.  I do not believe that these are contributing to her headaches but she states that they are tender to palpation.  I tried the patient on aimovig but her insurance refused to pay for it.  She has not yet seen neurology for Botox injections. Past Medical History:  Diagnosis Date   ASCUS of cervix with negative high risk HPV 04/2017   Asthma    Endometriosis    IBS (irritable bowel syndrome)    Migraine    Past Surgical History:  Procedure Laterality Date   ABDOMINAL HYSTERECTOMY     RSO   CESAREAN SECTION     DILATION AND CURETTAGE OF UTERUS     lower back surgery     "FUSION AND DISC REMOVED"   OOPHORECTOMY     RSO   PELVIC LAPAROSCOPY     Lysis of adhesions-Endometriosis   Current Outpatient Medications on File Prior to Visit  Medication Sig Dispense Refill   albuterol (VENTOLIN HFA) 108 (90 Base) MCG/ACT inhaler INHALE 2 PUFFS INTO THE LUNGS EVERY 6 HOURS AS NEEDED FOR WHEEZE OR SHORTNESS OF BREATH 8.5 g 11   clonazePAM (KLONOPIN) 0.5 MG tablet TAKE 1 TABLET BY MOUTH THREE TIMES DAILY AS NEEDED FOR ANXIETY 90 tablet 0   diclofenac (VOLTAREN) 75 MG EC tablet Take 1 tablet (75 mg total) by mouth 2 (two) times daily. 20 tablet 0    dicyclomine (BENTYL) 20 MG tablet Take 1 tablet (20 mg total) by mouth every 6 (six) hours as needed. 30 tablet 3   escitalopram (LEXAPRO) 20 MG tablet Take 1 tablet (20 mg total) by mouth daily. 30 tablet 5   estradiol (VIVELLE-DOT) 0.075 MG/24HR Place 1 patch onto the skin 2 (two) times a week. 24 patch 3   Fluticasone-Umeclidin-Vilant (TRELEGY ELLIPTA) 200-62.5-25 MCG/ACT AEPB Inhale 1 Inhalation into the lungs daily at 6 (six) AM. 1 each 11   furosemide (LASIX) 40 MG tablet Take 1 tablet (40 mg total) by mouth 2 (two) times daily. 60 tablet 3   Multiple Vitamin (MULTIVITAMIN) tablet Take 1 tablet by mouth daily.     potassium chloride SA (KLOR-CON M) 20 MEQ tablet Take 1 tablet (20 mEq total) by mouth daily. 30 tablet 3   rosuvastatin (CRESTOR) 10 MG tablet Take 1 tablet (10 mg total) by mouth daily. 90 tablet 3   topiramate (TOPAMAX) 25 MG tablet Take 1 tablet by mouth twice daily 60 tablet 0   valACYclovir (VALTREX) 500 MG tablet Take 1 tablet (500 mg total) by mouth daily. 90 tablet 3   valsartan (DIOVAN) 160 MG tablet Take 1 tablet (160 mg total) by  mouth daily. 90 tablet 3   No current facility-administered medications on file prior to visit.     Allergies  Allergen Reactions   Ampicillin Swelling    "PCN is fine"   Social History   Socioeconomic History   Marital status: Married    Spouse name: Not on file   Number of children: Not on file   Years of education: Not on file   Highest education level: Not on file  Occupational History   Not on file  Tobacco Use   Smoking status: Former   Smokeless tobacco: Never  Vaping Use   Vaping Use: Never used  Substance and Sexual Activity   Alcohol use: Yes    Alcohol/week: 5.0 standard drinks of alcohol    Types: 5 Standard drinks or equivalent per week   Drug use: Never   Sexual activity: Yes    Birth control/protection: Surgical    Comment: 1st intercourse 62 yo-More than 5 partners  Other Topics Concern   Not on file   Social History Narrative   Not on file   Social Determinants of Health   Financial Resource Strain: Not on file  Food Insecurity: Not on file  Transportation Needs: Not on file  Physical Activity: Not on file  Stress: Not on file  Social Connections: Not on file  Intimate Partner Violence: Not on file     Review of Systems  Neurological:  Positive for headaches.  All other systems reviewed and are negative.      Objective:   Physical Exam Vitals reviewed.  Constitutional:      General: She is not in acute distress.    Appearance: Normal appearance. She is not ill-appearing or toxic-appearing.  HENT:     Head: Normocephalic and atraumatic.     Right Ear: Tympanic membrane and ear canal normal.     Left Ear: Tympanic membrane and ear canal normal.  Eyes:     Extraocular Movements: Extraocular movements intact.     Pupils: Pupils are equal, round, and reactive to light.  Neck:   Cardiovascular:     Rate and Rhythm: Normal rate and regular rhythm.     Heart sounds: Normal heart sounds.  Pulmonary:     Effort: Pulmonary effort is normal.     Breath sounds: Wheezing present. No rales.  Musculoskeletal:     Cervical back: Neck supple. No rigidity.  Lymphadenopathy:     Cervical: No cervical adenopathy.  Neurological:     General: No focal deficit present.     Mental Status: She is alert and oriented to person, place, and time. Mental status is at baseline.     Cranial Nerves: No cranial nerve deficit.     Sensory: No sensory deficit.     Motor: No weakness.     Coordination: Coordination normal.     Gait: Gait normal.           Assessment & Plan:  Chronic migraine without aura without status migrainosus, not intractable  Sebaceous cyst With regards to her migraines and when to start the patient on gabapentin 300 mg p.o. twice daily and uptitrate as tolerated to avoid prophylax for the migraines.  Meanwhile and with a consult neurology to see if she would  benefit from Botox injections.  If not hopefully they can help [place] get her CGRP receptor blocker approved.  Resume Imitrex 100 mg p.o. as needed migraine headaches now that she has been cleared from cardiac standpoint.  I do not  believe the mass on her neck is related to her headaches.  I explained this to the patient.  I will be happy to send her to see a general surgeon to discuss possibly having it removed.

## 2022-05-11 ENCOUNTER — Telehealth: Payer: Self-pay | Admitting: Family Medicine

## 2022-05-11 NOTE — Telephone Encounter (Signed)
I have left vm asking pt to return my call. She is on the care gap list for a mammogram.

## 2022-05-11 NOTE — Telephone Encounter (Signed)
Pt called back stating that she would call [facility] and schedule her appt with them.

## 2022-05-26 ENCOUNTER — Other Ambulatory Visit: Payer: Self-pay | Admitting: Family Medicine

## 2022-05-26 DIAGNOSIS — G43709 Chronic migraine without aura, not intractable, without status migrainosus: Secondary | ICD-10-CM

## 2022-05-29 ENCOUNTER — Ambulatory Visit: Payer: 59 | Admitting: Neurology

## 2022-05-29 ENCOUNTER — Encounter: Payer: Self-pay | Admitting: Neurology

## 2022-05-29 VITALS — BP 115/79 | HR 83 | Ht 65.0 in | Wt 237.0 lb

## 2022-05-29 DIAGNOSIS — G43711 Chronic migraine without aura, intractable, with status migrainosus: Secondary | ICD-10-CM

## 2022-05-29 DIAGNOSIS — H539 Unspecified visual disturbance: Secondary | ICD-10-CM

## 2022-05-29 DIAGNOSIS — R51 Headache with orthostatic component, not elsewhere classified: Secondary | ICD-10-CM | POA: Diagnosis not present

## 2022-05-29 DIAGNOSIS — R7309 Other abnormal glucose: Secondary | ICD-10-CM | POA: Diagnosis not present

## 2022-05-29 DIAGNOSIS — R519 Headache, unspecified: Secondary | ICD-10-CM

## 2022-05-29 DIAGNOSIS — G8929 Other chronic pain: Secondary | ICD-10-CM | POA: Diagnosis not present

## 2022-05-29 MED ORDER — RIZATRIPTAN BENZOATE 10 MG PO TBDP: 10.0000 mg | ORAL_TABLET | ORAL | 11 refills | Status: DC | PRN

## 2022-05-29 MED ORDER — AJOVY 225 MG/1.5ML ~~LOC~~ SOAJ
225.0000 mg | SUBCUTANEOUS | 11 refills | Status: DC
Start: 2022-05-29 — End: 2023-08-21

## 2022-05-29 MED ORDER — UBRELVY 100 MG PO TABS: 100.0000 mg | ORAL_TABLET | ORAL | 0 refills | Status: DC | PRN

## 2022-05-29 MED ORDER — ONDANSETRON 4 MG PO TBDP: 4.0000 mg | ORAL_TABLET | Freq: Three times a day (TID) | ORAL | 3 refills | Status: DC | PRN

## 2022-05-29 NOTE — Patient Instructions (Addendum)
Discuss [id number] with primary care or [facility] [id number] - weight loss Wake up with headaches, obesity, snores heavily, exceissive daytime somnolence. Sleep evaluation MRI of the briain w/wo contrast IF diabetic Mounjaro - weight loss and diabetes Start Ajovy: once a month injection preventiopn Copay card Bernita Raisin as needed - samples: Please take one tablet at the onset of your headache. Take with ondansetron. If it does not improve the symptoms please take one additional tablet.  Rizatriptan: Please take one tablet at the onset of your headache. If it does not improve the symptoms please take one additional tablet. Do not take more then 2 tablets in 24hrs. Do not take use more then 2 to 3 times in a week.  Sleep Apnea Sleep apnea is a condition in which breathing pauses or becomes shallow during sleep. People with sleep apnea usually snore loudly. They may have times when they gasp and stop breathing for 10 seconds or more during sleep. This may happen many times during the night. Sleep apnea disrupts your sleep and keeps your body from getting the rest that it needs. This condition can increase your risk of certain health problems, including: Heart attack. Stroke. Obesity. Type 2 diabetes. Heart failure. Irregular heartbeat. High blood pressure. The goal of treatment is to help you breathe normally again. What are the causes?  The most common cause of sleep apnea is a collapsed or blocked airway. There are three kinds of sleep apnea: Obstructive sleep apnea. This kind is caused by a blocked or collapsed airway. Central sleep apnea. This kind happens when the part of the brain that controls breathing does not send the correct signals to the muscles that control breathing. Mixed sleep apnea. This is a combination of obstructive and central sleep apnea. What increases the risk? You are more likely to develop this condition if you: Are overweight. Smoke. Have a smaller  than normal airway. Are older. Are female. Drink alcohol. Take sedatives or tranquilizers. Have a family history of sleep apnea. Have a tongue or tonsils that are larger than normal. What are the signs or symptoms? Symptoms of this condition include: Trouble staying asleep. Loud snoring. Morning headaches. Waking up gasping. Dry mouth or sore throat in the morning. Daytime sleepiness and tiredness. If you have daytime fatigue because of sleep apnea, you may be more likely to have: Trouble concentrating. Forgetfulness. Irritability or mood swings. Personality changes. Feelings of depression. Sexual dysfunction. This may include loss of interest if you are female, or erectile dysfunction if you are female. How is this diagnosed? This condition may be diagnosed with: A medical history. A physical exam. A series of tests that are done while you are sleeping (sleep study). These tests are usually done in a sleep lab, but they may also be done at home. How is this treated? Treatment for this condition aims to restore normal breathing and to ease symptoms during sleep. It may involve managing health issues that can affect breathing, such as high blood pressure or obesity. Treatment may include: Sleeping on your side. Using a decongestant if you have nasal congestion. Avoiding the use of depressants, including alcohol, sedatives, and narcotics. Losing weight if you are overweight. Making changes to your diet. Quitting smoking. Using a device to open your airway while you sleep, such as: An oral appliance. This is a custom-made mouthpiece that shifts your lower jaw forward. A continuous positive airway pressure (CPAP) device. This device blows air through a mask when you breathe out (exhale).  A nasal expiratory positive airway pressure (EPAP) device. This device has valves that you put into each nostril. A bi-level positive airway pressure (BIPAP) device. This device blows air through a  mask when you breathe in (inhale) and breathe out (exhale). Having surgery if other treatments do not work. During surgery, excess tissue is removed to create a wider airway. Follow these instructions at home: Lifestyle Make any lifestyle changes that your health care provider recommends. Eat a healthy, well-balanced diet. Take steps to lose weight if you are overweight. Avoid using depressants, including alcohol, sedatives, and narcotics. Do not use any products that contain nicotine or tobacco. These products include cigarettes, chewing tobacco, and vaping devices, such as e-cigarettes. If you need help quitting, ask your health care provider. General instructions Take over-the-counter and prescription medicines only as told by your health care provider. If you were given a device to open your airway while you sleep, use it only as told by your health care provider. If you are having surgery, make sure to tell your health care provider you have sleep apnea. You may need to bring your device with you. Keep all follow-up visits. This is important. Contact a health care provider if: The device that you received to open your airway during sleep is uncomfortable or does not seem to be working. Your symptoms do not improve. Your symptoms get worse. Get help right away if: You develop: Chest pain. Shortness of breath. Discomfort in your back, arms, or stomach. You have: Trouble speaking. Weakness on one side of your body. Drooping in your face. These symptoms may represent a serious problem that is an emergency. Do not wait to see if the symptoms will go away. Get medical help right away. Call your local emergency services (911 in the [place] Do not drive yourself to the hospital. Summary Sleep apnea is a condition in which breathing pauses or becomes shallow during sleep. The most common cause is a collapsed or blocked airway. The goal of treatment is to restore normal breathing and to ease  symptoms during sleep. This information is not intended to replace advice given to you by your health care provider. Make sure you discuss any questions you have with your health care provider. Document Revised: 08/28/2020 Document Reviewed: 12/29/2019 Elsevier Patient Education  2023 [employer]    Meds ordered this encounter  Medications   Fremanezumab-vfrm (AJOVY) 225 MG/1.5ML SOAJ    Sig: Inject 225 mg into the skin every 30 (thirty) days.    Dispense:  1.5 mL    Refill:  11    ID: [id number] PCN PDMI BIN [id number] GRP [id number] PLEASE RUN COPAY CARD EX Date 02/02/2023   Ubrogepant (UBRELVY) 100 MG TABS    Sig: Take 1 tablet (100 mg total) by mouth every 2 (two) hours as needed. Maximum 200mg  a day.    Dispense:  8 tablet    Refill:  0   ondansetron (ZOFRAN-ODT) 4 MG disintegrating tablet    Sig: Take 1-2 tablets (4-8 mg total) by mouth every 8 (eight) hours as needed.    Dispense:  30 tablet    Refill:  3   rizatriptan (MAXALT-MLT) 10 MG disintegrating tablet    Sig: Take 1 tablet (10 mg total) by mouth as needed for migraine. May repeat in 2 hours if needed    Dispense:  9 tablet    Refill:  11      Migraine Headache A migraine headache is an intense pulsing or throbbing pain  on one or both sides of the head. Migraine headaches may also cause other symptoms, such as nausea, vomiting, and sensitivity to light and noise. A migraine headache can last from 4 hours to 3 days. Talk with your health care provider about what things may bring on (trigger) your migraine headaches. What are the causes? The exact cause is not known. However, a migraine may be caused when nerves in the brain get irritated and release chemicals that cause blood vessels to become inflamed. This inflammation causes pain. Migraines may be triggered or caused by: Smoking. Medicines, such as: Nitroglycerin, which is used to treat chest pain. Birth control pills. Estrogen. Certain blood pressure  medicines. Foods or drinks that contain nitrates, glutamate, aspartame, MSG, or tyramine. Certain foods or drinks, such as aged cheeses, chocolate, alcohol, or caffeine. Doing physical activity that is very hard. Other triggers may include: Menstruation. Pregnancy. Hunger. Stress. Getting too much or too little sleep. Weather changes. Tiredness (fatigue). What increases the risk? The following factors may make you more likely to have migraine headaches: Being between the ages of 51-30 years old. Being female. Having a family history of migraine headaches. Being Caucasian. Having a mental health condition, such as depression or anxiety. Being obese. What are the signs or symptoms? The main symptom of this condition is pulsing or throbbing pain. This pain may: Happen in any area of the head, such as on one or both sides. Make it hard to do daily activities. Get worse with physical activity. Get worse around bright lights, loud noises, or smells. Other symptoms may include: Nausea. Vomiting. Dizziness. Before a migraine headache starts, you may get warning signs (an aura). An aura may include: Seeing flashing lights or having blind spots. Seeing bright spots, halos, or zigzag lines. Having tunnel vision or blurred vision. Having numbness or a tingling feeling. Having trouble talking. Having muscle weakness. After a migraine ends, you may have symptoms. These may include: Feeling tired. Trouble concentrating. How is this diagnosed? A migraine headache can be diagnosed based on: Your symptoms. A physical exam. Tests, such as: A CT scan or an MRI of the head. These tests can help rule out other causes of headaches. Taking fluid from the spine (lumbar puncture) to examine it (cerebrospinal fluid analysis, or CSF analysis). How is this treated? This condition may be treated with medicines that: Relieve pain and nausea. Prevent migraines. Treatment may also  include: Acupuncture. Lifestyle changes like avoiding foods that trigger migraine headaches. Learning ways to control your body (biofeedback). Talk therapy to help you know and deal with negative thoughts (cognitive behavioral therapy). Follow these instructions at home: Medicines Take over-the-counter and prescription medicines only as told by your provider. Ask your provider if the medicine prescribed to you: Requires you to avoid driving or using machinery. Can cause constipation. You may need to take these actions to prevent or treat constipation: Drink enough fluid to keep your pee (urine) pale yellow. Take over-the-counter or prescription medicines. Eat foods that are high in fiber, such as beans, whole grains, and fresh fruits and vegetables. Limit foods that are high in fat and processed sugars, such as fried or sweet foods. Lifestyle  Do not drink alcohol. Do not use any products that contain nicotine or tobacco. These products include cigarettes, chewing tobacco, and vaping devices, such as e-cigarettes. If you need help quitting, ask your provider. Get 7-9 hours of sleep each night, or the amount recommended by your provider. Find ways to manage stress, such  as meditation, deep breathing, or yoga. Try to exercise regularly. This can help lessen how bad and how often your migraines occur. General instructions Keep a journal to find out what triggers your migraines, so you can avoid those things. For example, write down: What you eat and drink. How much sleep you get. Any change to your diet or medicines. If you have a migraine headache: Avoid things that make your symptoms worse, such as bright lights. Lie down in a dark, quiet room. Do not drive or use machinery. Ask your provider what activities are safe for you while you have symptoms. Keep all follow-up visits. Your provider will monitor your symptoms and recommend any further treatment. Where to find more  information Coalition for Headache and Migraine Patients (CHAMP): headachemigraine.org American Migraine Foundation: americanmigrainefoundation.org National Headache Foundation: headaches.org Contact a health care provider if: You have symptoms that are different or worse than your usual migraine headache symptoms. You have more than 15 days of headaches in one month. Get help right away if: Your migraine headache becomes severe or lasts more than 72 hours. You have a fever or stiff neck. You have vision loss. Your muscles feel weak or like you cannot control them. You lose your balance often or have trouble walking. You faint. You have a seizure. This information is not intended to replace advice given to you by your health care provider. Make sure you discuss any questions you have with your health care provider. Document Revised: 09/15/2021 Document Reviewed: 09/15/2021 Elsevier Patient Education  2023 [employer] Inc[employer]   Analgesic rebound Analgesic Rebound Headache An analgesic rebound headache is a secondary disorder that is caused by the overuse of pain medicine (analgesic) to treat the original (primary) headache. It is sometimes called a medication overuse headache or a drug-induced headache. Any type of primary headache can return as a rebound headache if a person regularly takes pain medicine. The types of primary headaches that are commonly related to rebound headaches include: Migraines. Tension headaches. These are caused by tense muscles in the head and neck area. Cluster headaches. These happen on one side of the head and around the eye. If rebound headaches continue, they can become long-term, daily headaches. What are the causes? Rebound headaches may be caused by frequent use of: Over-the-counter medicines, such as aspirin, ibuprofen, and acetaminophen. Sinus-relief medicines. Medicines that contain caffeine. Narcotic pain medicines, such as codeine and  oxycodone. Some prescription migraine medicines. What are the signs or symptoms? The symptoms of a rebound headache are the same as the symptoms of the primary headache. Symptoms of specific types of headaches include: Migraine headache Pulsing or throbbing pain on one or both sides of the head. Severe pain that makes it hard to do daily activities. Pain that gets worse with physical activity. Nausea, vomiting, or both. Pain that may get worse around bright lights, loud noises, or smells. Vision changes. Numbness in one or both arms. Tension headache Pressure around the head. Dull, aching head pain. Pain felt over the front and sides of the head. Tenderness in the muscles of the head, neck, and shoulders. Cluster headache Severe pain that begins in or around one eye or temple. Droopy or swollen eyelid, or redness and tearing in the eye on the same side as the pain. One-sided head pain. Nausea. Runny nose. Sweaty, pale facial skin. Restlessness. How is this diagnosed? Rebound headaches are diagnosed by reviewing: Your medical history, including a description of your primary headaches. Your pain medicines for your  primary headaches and how often you take them. How is this treated? Rebound headaches may be treated or managed by: Stopping frequent use of pain medicine. This may make your headaches worse at first, but the pain should then become less manageable and less frequent and severe. Seeing a headache specialist. They may be able to help you manage your headaches and help make sure there is not another cause of the headaches. Using methods of stress relief, such as acupuncture, counseling, biofeedback, and massage. Follow these instructions at home: Medicines  Take over-the-counter and prescription medicines only as told by your health care provider. Stop the repeated use of pain medicine as told by your provider. Stopping can be hard. Carefully follow instructions from your  provider. Lifestyle Do not drink alcohol. Do not use any products that contain nicotine or tobacco. These products include cigarettes, chewing tobacco, and vaping devices, such as e-cigarettes. If you need help quitting, ask your provider. Get 7-9 hours of sleep each night, or the amount recommended by your provider. Find ways to manage stress, such as acupuncture, counseling, biofeedback, and massage. Exercise regularly. Exercise for at least 30 minutes, 5 times each week. General instructions Avoid things that may bring on (trigger) your primary headaches, such as certain foods. Contact a health care provider if: Medicine does not help your migraine. Your pain keeps coming back even with medicine. Get help right away if: You have new headache pain. You have headache pain that is different than what you have felt in the past. You have numbness or tingling in your arms or legs. You have changes in your speech or vision. This information is not intended to replace advice given to you by your health care provider. Make sure you discuss any questions you have with your health care provider. Document Revised: 09/15/2021 Document Reviewed: 09/15/2021 Elsevier Patient Education  2023 [employer].

## 2022-05-29 NOTE — Progress Notes (Unsigned)
[facility]    Provider:  Dr Lucia Gaskins Requesting Provider: Donita Brooks, MD Primary Care Provider:  Donita Brooks, MD  CC:  migraines;   Here alone for migraines. She has had migraines since 62 years old. She remembers having terrible headaches w/ nausea leading to ER visit in 1986. She has had a headache daily for the last 7 weeks. She has been under great stress with her mother having dementia and being admitted to memory care, plus now she has two masses on her neck that she will be seeing a Careers adviser for. She has tried Fiorinal, imitrex, phenergan, Topiramate (not helping but only taking 25 mg BID), Gabapentin, Lexapro, tylenol, aleve.    HPI:  Deborah Jennings is a 62 y.o. female here as requested by Donita Brooks, MD for migraines. PMHx has Endometriosis; IBS (irritable bowel syndrome); Migraine; Paronychia of right index finger; Ganglion; Avulsion fracture of right ankle; Right shoulder pain; Sprain of right ankle; and Acute pain of right knee on their problem list.  I reviewed Dr. Solon Augusta notes: Last seen April 23, 2022, she has a history of chronic migraines and is on Topamax for prevention which she says is not helping.  She also tried Imitrex but that was stopped because he was concerned about possible angina.  She has since been cleared by cardiology.  She is reporting daily headaches, she believes they are related to the mass on the back of her neck which appears to be a large sebaceous cyst located directly above the cervical spine it is freely mobile and spongy and possibly a lipoma.  I do not believe that these are contributing to her headaches but they are tender to palpation.  He has tried the patient on Aimovig but her insurance refused to pay she has not seen neurology yet.  Pulsating/pounding, throbbing, photo/phonophobia, blurred vision, worsening headaches, pulsatile tinnitus, wakes up in the morning and has them also nocturnal headaches, no autonomic  symptoms. Triggers are stress, used to be worse with periods and still feels monthly worse, nausea, hurts to move, can last for 12-24 hours moderately severe and next day hangover. No aura.. Topamax does not help. Imitrex does not help. Stress is a trigger. Migraines for decades since 62 years old. Daily headaches, 15 moderate to severe   ***do mri here   Reviewed notes, labs and imaging from outside physicians, which showed ***  From a thorough review of records, medications tried that can be used in migraine management include Fioricet and Fiorinal (which we do not recommend neurology), Decadron tabs and injections, Voltaren tabs, she is on Lexapro so amitriptyline/nortriptyline/Effexor are contraindicated due to risk of serotonin syndrome, ketorolac injections, meloxicam, Reglan, naproxen, Zofran, prednisone tabs, Phenergan, Imitrex, topiramate, rizatriptan, tramadol, valsartan which makes candesartan contraindicated and other blood pressure medications contraindicated due to hypotension, aimovig contraindicated due to constipation.      Latest Ref Rng & Units 01/29/2022    4:01 PM 11/10/2021    1:05 PM 08/30/2020   11:44 AM  CMP  Glucose 65 - 99 mg/dL 540  981  191   BUN 7 - 25 mg/dL 13  22  32   Creatinine 0.50 - 1.05 mg/dL 4.78  2.95  6.21   Sodium 135 - 146 mmol/L 142  137  139   Potassium 3.5 - 5.3 mmol/L 3.4  4.1  4.6   Chloride 98 - 110 mmol/L 104  100  98   CO2 20 - 32 mmol/L 28  25  28   Calcium 8.6 - 10.4 mg/dL 9.4  16.1  09.6   Total Protein 6.1 - 8.1 g/dL  7.2  7.0   Total Bilirubin 0.2 - 1.2 mg/dL  0.4  0.6   AST 10 - 35 U/L  22  34   ALT 6 - 29 U/L  23  33       Latest Ref Rng & Units 11/10/2021    1:05 PM 08/30/2020   11:44 AM 08/14/2020   12:08 PM  CBC  WBC 3.8 - 10.8 Thousand/uL 8.1  8.8  7.2   Hemoglobin 11.7 - 15.5 g/dL 04.5  40.9  81.1   Hematocrit 35.0 - 45.0 % 43.4  40.3  39.1   Platelets 140 - 400 Thousand/uL 205  224  215    08/30/2020: tshnml 0.67 nml sed  rate Review of Systems: Patient complains of symptoms per HPI as well as the following symptoms ***. Pertinent negatives and positives per HPI. All others negative.   Social History   Socioeconomic History   Marital status: Married    Spouse name: Not on file   Number of children: Not on file   Years of education: Not on file   Highest education level: Not on file  Occupational History   Not on file  Tobacco Use   Smoking status: Former   Smokeless tobacco: Never  Vaping Use   Vaping Use: Never used  Substance and Sexual Activity   Alcohol use: Yes    Alcohol/week: 5.0 standard drinks of alcohol    Types: 5 Standard drinks or equivalent per week   Drug use: Never   Sexual activity: Yes    Birth control/protection: Surgical    Comment: 1st intercourse 62 yo-More than 5 partners  Other Topics Concern   Not on file  Social History Narrative   Not on file   Social Determinants of Health   Financial Resource Strain: Not on file  Food Insecurity: Not on file  Transportation Needs: Not on file  Physical Activity: Not on file  Stress: Not on file  Social Connections: Not on file  Intimate Partner Violence: Not on file    Family History  Problem Relation Age of Onset   Diabetes Father    Hypertension Father    Hypertension Mother    Dementia Mother     Past Medical History:  Diagnosis Date   ASCUS of cervix with negative high risk HPV 04/2017   Asthma    Endometriosis    IBS (irritable bowel syndrome)    Migraine     Patient Active Problem List   Diagnosis Date Noted   Acute pain of right knee 12/20/2018   Ganglion 05/26/2017   Paronychia of right index finger 05/13/2017   Avulsion fracture of right ankle 03/29/2014   Right shoulder pain 12/05/2013   Sprain of right ankle 12/05/2013   Migraine    Endometriosis    IBS (irritable bowel syndrome)     Past Surgical History:  Procedure Laterality Date   ABDOMINAL HYSTERECTOMY     RSO   CESAREAN SECTION      DILATION AND CURETTAGE OF UTERUS     lower back surgery     "FUSION AND DISC REMOVED"   OOPHORECTOMY     RSO   PELVIC LAPAROSCOPY     Lysis of adhesions-Endometriosis    Current Outpatient Medications  Medication Sig Dispense Refill   albuterol (VENTOLIN HFA) 108 (90 Base) MCG/ACT inhaler INHALE 2 PUFFS INTO  THE LUNGS EVERY 6 HOURS AS NEEDED FOR WHEEZE OR SHORTNESS OF BREATH 8.5 g 11   clonazePAM (KLONOPIN) 0.5 MG tablet TAKE 1 TABLET BY MOUTH THREE TIMES DAILY AS NEEDED FOR ANXIETY 90 tablet 0   diclofenac (VOLTAREN) 75 MG EC tablet Take 1 tablet (75 mg total) by mouth 2 (two) times daily. 20 tablet 0   dicyclomine (BENTYL) 20 MG tablet Take 1 tablet (20 mg total) by mouth every 6 (six) hours as needed. 30 tablet 3   escitalopram (LEXAPRO) 20 MG tablet Take 1 tablet (20 mg total) by mouth daily. 30 tablet 5   estradiol (VIVELLE-DOT) 0.075 MG/24HR Place 1 patch onto the skin 2 (two) times a week. 24 patch 3   Fluticasone-Umeclidin-Vilant (TRELEGY ELLIPTA) 200-62.5-25 MCG/ACT AEPB Inhale 1 Inhalation into the lungs daily at 6 (six) AM. 1 each 11   furosemide (LASIX) 40 MG tablet Take 1 tablet (40 mg total) by mouth 2 (two) times daily. 60 tablet 3   gabapentin (NEURONTIN) 300 MG capsule Take 1 capsule (300 mg total) by mouth 2 (two) times daily. 60 capsule 3   Multiple Vitamin (MULTIVITAMIN) tablet Take 1 tablet by mouth daily.     potassium chloride SA (KLOR-CON M) 20 MEQ tablet Take 1 tablet (20 mEq total) by mouth daily. 30 tablet 3   rosuvastatin (CRESTOR) 10 MG tablet Take 1 tablet (10 mg total) by mouth daily. 90 tablet 3   SUMAtriptan (IMITREX) 100 MG tablet TAKE 1 TABLET BY MOUTH EVERY 2 HOURS AS NEEDED FOR MIGRAINE.MAY REPEAT IN 2 HOURS IF HEADACHE PERSISTS OR RECURS. 10 tablet 0   topiramate (TOPAMAX) 25 MG tablet Take 1 tablet by mouth twice daily 60 tablet 0   valACYclovir (VALTREX) 500 MG tablet Take 1 tablet (500 mg total) by mouth daily. 90 tablet 3   valsartan (DIOVAN)  160 MG tablet Take 1 tablet (160 mg total) by mouth daily. 90 tablet 3   No current facility-administered medications for this visit.    Allergies as of 05/29/2022 - Review Complete 02/04/2022  Allergen Reaction Noted   Ampicillin Swelling 03/16/2011    Vitals: There were no vitals taken for this visit. Last Weight:  Wt Readings from Last 1 Encounters:  04/23/22 234 lb (106.1 kg)   Last Height:   Ht Readings from Last 1 Encounters:  04/23/22 5\' 2"  (1.575 m)     Physical exam: Exam: Gen: NAD, conversant, well nourised, obese, well groomed                     CV: RRR, no MRG. No Carotid Bruits. No peripheral edema, warm, nontender Eyes: Conjunctivae clear without exudates or hemorrhage  Neuro: Detailed Neurologic Exam  Speech:    Speech is normal; fluent and spontaneous with normal comprehension.  Cognition:    The patient is oriented to person, place, and time;     recent and remote memory intact;     language fluent;     normal attention, concentration,     fund of knowledge Cranial Nerves:    The pupils are equal, round, and reactive to light. The fundi are normal and spontaneous venous pulsations are present. Visual fields are full to finger confrontation. Extraocular movements are intact. Trigeminal sensation is intact and the muscles of mastication are normal. The face is symmetric. The palate elevates in the midline. Hearing intact. Voice is normal. Shoulder shrug is normal. The tongue has normal motion without fasciculations.   Coordination:  Normal finger to nose and heel to shin. Normal rapid alternating movements.   Gait:    Heel-toe and tandem gait are normal.   Motor Observation:    No asymmetry, no atrophy, and no involuntary movements noted. Tone:    Normal muscle tone.    Posture:    Posture is normal. normal erect    Strength:    Strength is V/V in the upper and lower limbs.      Sensation: intact to LT     Reflex Exam:  DTR's:    Deep  tendon reflexes in the upper and lower extremities are normal bilaterally.   Toes:    The toes are downgoing bilaterally.   Clonus:    Clonus is absent.    Assessment/Plan:    No orders of the defined types were placed in this encounter.  No orders of the defined types were placed in this encounter.   Cc: Donita Brooks, MD,  Donita Brooks, MD  Naomie Dean, MD  [facility] [address] [place], [place] [address]  Phone [phone] Fax [phone]

## 2022-05-30 ENCOUNTER — Encounter: Payer: Self-pay | Admitting: Neurology

## 2022-05-30 DIAGNOSIS — G43711 Chronic migraine without aura, intractable, with status migrainosus: Secondary | ICD-10-CM | POA: Insufficient documentation

## 2022-05-30 LAB — COMPREHENSIVE METABOLIC PANEL
ALT: 44 IU/L — ABNORMAL HIGH (ref 0–32)
AST: 84 IU/L — ABNORMAL HIGH (ref 0–40)
Albumin/Globulin Ratio: 1.8 (ref 1.2–2.2)
Albumin: 4.5 g/dL (ref 3.8–4.9)
Alkaline Phosphatase: 105 IU/L (ref 44–121)
BUN/Creatinine Ratio: 21 (ref 12–28)
BUN: 28 mg/dL — ABNORMAL HIGH (ref 8–27)
Bilirubin Total: 0.3 mg/dL (ref 0.0–1.2)
CO2: 25 mmol/L (ref 20–29)
Calcium: 9.3 mg/dL (ref 8.7–10.3)
Chloride: 99 mmol/L (ref 96–106)
Creatinine, Ser: 1.31 mg/dL — ABNORMAL HIGH (ref 0.57–1.00)
Globulin, Total: 2.5 g/dL (ref 1.5–4.5)
Glucose: 116 mg/dL — ABNORMAL HIGH (ref 70–99)
Potassium: 4.3 mmol/L (ref 3.5–5.2)
Sodium: 142 mmol/L (ref 134–144)
Total Protein: 7 g/dL (ref 6.0–8.5)
eGFR: 47 mL/min/{1.73_m2} — ABNORMAL LOW (ref >59)

## 2022-05-30 LAB — CBC WITH DIFFERENTIAL/PLATELET
Basophils Absolute: 0 10*3/uL (ref 0.0–0.2)
Basos: 0 %
EOS (ABSOLUTE): 0.2 10*3/uL (ref 0.0–0.4)
Eos: 2 %
Hematocrit: 39.4 % (ref 34.0–46.6)
Hemoglobin: 13 g/dL (ref 11.1–15.9)
Immature Grans (Abs): 0.1 10*3/uL (ref 0.0–0.1)
Immature Granulocytes: 1 %
Lymphocytes Absolute: 2.3 10*3/uL (ref 0.7–3.1)
Lymphs: 24 %
MCH: 32 pg (ref 26.6–33.0)
MCHC: 33 g/dL (ref 31.5–35.7)
MCV: 97 fL (ref 79–97)
Monocytes Absolute: 0.5 10*3/uL (ref 0.1–0.9)
Monocytes: 5 %
Neutrophils Absolute: 6.7 10*3/uL (ref 1.4–7.0)
Neutrophils: 68 %
Platelets: 215 10*3/uL (ref 150–450)
RBC: 4.06 x10E6/uL (ref 3.77–5.28)
RDW: 13.7 % (ref 11.7–15.4)
WBC: 9.8 10*3/uL (ref 3.4–10.8)

## 2022-05-30 LAB — HEMOGLOBIN A1C
Est. average glucose Bld gHb Est-mCnc: 157 mg/dL
Hgb A1c MFr Bld: 7.1 % — ABNORMAL HIGH (ref 4.8–5.6)

## 2022-05-30 LAB — TSH RFX ON ABNORMAL TO FREE T4: TSH: 1.18 u[IU]/mL (ref 0.450–4.500)

## 2022-06-01 ENCOUNTER — Telehealth: Payer: Self-pay | Admitting: Neurology

## 2022-06-01 NOTE — Telephone Encounter (Signed)
Pod 4, let Deborah Jennings know - It looks like her kidney values have worsenined abd so have her liver values. In addition she is diabetic at hgba1c 7.1. I would get with your primary care Dr. Tanya Nones about these thanks.       Latest Ref Rng & Units 05/29/2022   10:58 AM 01/29/2022    4:01 PM 11/10/2021    1:05 PM  CMP  Glucose 70 - 99 mg/dL 161  096  045   BUN 8 - 27 mg/dL 28  13  22    Creatinine 0.57 - 1.00 mg/dL 4.09  8.11  9.14   Sodium 134 - 144 mmol/L 142  142  137   Potassium 3.5 - 5.2 mmol/L 4.3  3.4  4.1   Chloride 96 - 106 mmol/L 99  104  100   CO2 20 - 29 mmol/L 25  28  25    Calcium 8.7 - 10.3 mg/dL 9.3  9.4  78.2   Total Protein 6.0 - 8.5 g/dL 7.0   7.2   Total Bilirubin 0.0 - 1.2 mg/dL 0.3   0.4   Alkaline Phos 44 - 121 IU/L 105     AST 0 - 40 IU/L 84   22   ALT 0 - 32 IU/L 44   23        Hgba1c 7.1 (I don;t see any previous hgba1c to compare)

## 2022-06-01 NOTE — Telephone Encounter (Signed)
sent to GI they obtain [employer] auth [phone] 

## 2022-06-01 NOTE — Telephone Encounter (Signed)
I called the patient and relayed the lab result message to patient as noted by Dr Lucia Gaskins below. The patient's questions were answered and she will call primary care and make an appointment to address the findings. She verbalized appreciation for the call.   Lab results sent to Dr Tanya Nones.

## 2022-06-02 DIAGNOSIS — R222 Localized swelling, mass and lump, trunk: Secondary | ICD-10-CM | POA: Diagnosis not present

## 2022-06-02 DIAGNOSIS — G894 Chronic pain syndrome: Secondary | ICD-10-CM | POA: Diagnosis not present

## 2022-06-02 DIAGNOSIS — K429 Umbilical hernia without obstruction or gangrene: Secondary | ICD-10-CM | POA: Diagnosis not present

## 2022-06-02 DIAGNOSIS — G43809 Other migraine, not intractable, without status migrainosus: Secondary | ICD-10-CM | POA: Diagnosis not present

## 2022-06-02 DIAGNOSIS — R221 Localized swelling, mass and lump, neck: Secondary | ICD-10-CM | POA: Diagnosis not present

## 2022-06-02 NOTE — Telephone Encounter (Signed)
LVM for to call and make an appt w/pcp to discuss lab results.

## 2022-06-03 ENCOUNTER — Ambulatory Visit: Payer: Self-pay | Admitting: Surgery

## 2022-06-05 ENCOUNTER — Encounter: Payer: Self-pay | Admitting: Family Medicine

## 2022-06-08 ENCOUNTER — Other Ambulatory Visit: Payer: Self-pay | Admitting: Surgery

## 2022-06-08 DIAGNOSIS — D171 Benign lipomatous neoplasm of skin and subcutaneous tissue of trunk: Secondary | ICD-10-CM | POA: Diagnosis not present

## 2022-06-08 DIAGNOSIS — D170 Benign lipomatous neoplasm of skin and subcutaneous tissue of head, face and neck: Secondary | ICD-10-CM | POA: Diagnosis not present

## 2022-06-20 ENCOUNTER — Other Ambulatory Visit: Payer: Self-pay | Admitting: Family Medicine

## 2022-06-22 NOTE — Telephone Encounter (Signed)
OV 04/23/22 Requested Prescriptions  Pending Prescriptions Disp Refills   Potassium Chloride ER 20 MEQ TBCR [Pharmacy Med Name: Potassium Chloride ER 20 MEQ Oral Tablet Extended Release] 30 tablet 0    Sig: Take 1 tablet by mouth once daily     Endocrinology:  Minerals - Potassium Supplementation Failed - 06/20/2022  6:51 AM      Failed - Cr in normal range and within 360 days    Creat  Date Value Ref Range Status  01/29/2022 0.80 0.50 - 1.05 mg/dL Final   Creatinine, Ser  Date Value Ref Range Status  05/29/2022 1.31 (H) 0.57 - 1.00 mg/dL Final         Failed - Valid encounter within last 12 months    Recent Outpatient Visits           1 year ago Diarrhea, unspecified type   [facility] Donita Brooks, MD   1 year ago Diarrhea, unspecified type   [facility] Valentino Nose, NP   2 years ago Benign essential HTN   [facility] Donita Brooks, MD   2 years ago Current moderate episode of major depressive disorder without prior episode (HCC)   [facility] Pickard, Priscille Heidelberg, MD   2 years ago Status migrainosus   [facility] Pickard, Priscille Heidelberg, MD              Passed - K in normal range and within 360 days    Potassium  Date Value Ref Range Status  05/29/2022 4.3 3.5 - 5.2 mmol/L Final

## 2022-06-23 ENCOUNTER — Other Ambulatory Visit: Payer: Self-pay | Admitting: Family Medicine

## 2022-06-23 DIAGNOSIS — G43709 Chronic migraine without aura, not intractable, without status migrainosus: Secondary | ICD-10-CM

## 2022-06-23 NOTE — Telephone Encounter (Signed)
Requested medication (s) are due for refill today:yes  Requested medication (s) are on the active medication list:yes  Last refill:  05/26/22 #60  Future visit scheduled: no  Notes to clinic:  lab work out of range- noted in result notes that PCP to review. Was not sure if can refill - didn't see notation about lab work and if ok to refill   Requested Prescriptions  Pending Prescriptions Disp Refills   topiramate (TOPAMAX) 25 MG tablet [Pharmacy Med Name: Topiramate 25 MG Oral Tablet] 60 tablet 0    Sig: Take 1 tablet by mouth twice daily     Neurology: Anticonvulsants - topiramate & zonisamide Failed - 06/23/2022  9:30 AM      Failed - Cr in normal range and within 360 days    Creat  Date Value Ref Range Status  01/29/2022 0.80 0.50 - 1.05 mg/dL Final   Creatinine, Ser  Date Value Ref Range Status  05/29/2022 1.31 (H) 0.57 - 1.00 mg/dL Final         Failed - ALT in normal range and within 360 days    ALT  Date Value Ref Range Status  05/29/2022 44 (H) 0 - 32 IU/L Final         Failed - AST in normal range and within 360 days    AST  Date Value Ref Range Status  05/29/2022 84 (H) 0 - 40 IU/L Final         Failed - Valid encounter within last 12 months    Recent Outpatient Visits           1 year ago Diarrhea, unspecified type   [facility] Donita Brooks, MD   1 year ago Diarrhea, unspecified type   [facility] Medicine Valentino Nose, NP   2 years ago Benign essential HTN   [facility] Donita Brooks, MD   2 years ago Current moderate episode of major depressive disorder without prior episode (HCC)   [facility] Medicine Pickard, Priscille Heidelberg, MD   2 years ago Status migrainosus   [facility] Pickard, Priscille Heidelberg, MD              Passed - CO2 in normal range and within 360 days    CO2  Date Value Ref Range Status  05/29/2022 25 20 - 29 mmol/L Final         Passed - Completed PHQ-2  or PHQ-9 in the last 360 days

## 2022-07-24 ENCOUNTER — Other Ambulatory Visit: Payer: Self-pay | Admitting: Family Medicine

## 2022-07-24 DIAGNOSIS — G43709 Chronic migraine without aura, not intractable, without status migrainosus: Secondary | ICD-10-CM

## 2022-07-24 NOTE — Telephone Encounter (Signed)
Requested medications are due for refill today.  yes  Requested medications are on the active medications list.  yes  Last refill. 06/24/2022 #60 0 rf  Future visit scheduled.   no  Notes to clinic.  Abnormal labs.     Requested Prescriptions  Pending Prescriptions Disp Refills   topiramate (TOPAMAX) 25 MG tablet [Pharmacy Med Name: Topiramate 25 MG Oral Tablet] 60 tablet 0    Sig: Take 1 tablet by mouth twice daily     Neurology: Anticonvulsants - topiramate & zonisamide Failed - 07/24/2022 11:44 AM      Failed - Cr in normal range and within 360 days    Creat  Date Value Ref Range Status  01/29/2022 0.80 0.50 - 1.05 mg/dL Final   Creatinine, Ser  Date Value Ref Range Status  05/29/2022 1.31 (H) 0.57 - 1.00 mg/dL Final         Failed - ALT in normal range and within 360 days    ALT  Date Value Ref Range Status  05/29/2022 44 (H) 0 - 32 IU/L Final         Failed - AST in normal range and within 360 days    AST  Date Value Ref Range Status  05/29/2022 84 (H) 0 - 40 IU/L Final         Failed - Valid encounter within last 12 months    Recent Outpatient Visits           1 year ago Diarrhea, unspecified type   [facility] Medicine Donita Brooks, MD   1 year ago Diarrhea, unspecified type   [facility] Medicine Valentino Nose, NP   2 years ago Benign essential HTN   [facility] Donita Brooks, MD   2 years ago Current moderate episode of major depressive disorder without prior episode (HCC)   [facility] Pickard, Priscille Heidelberg, MD   2 years ago Status migrainosus   [facility] Medicine Pickard, Priscille Heidelberg, MD              Passed - CO2 in normal range and within 360 days    CO2  Date Value Ref Range Status  05/29/2022 25 20 - 29 mmol/L Final         Passed - Completed PHQ-2 or PHQ-9 in the last 360 days

## 2022-07-25 ENCOUNTER — Other Ambulatory Visit: Payer: Self-pay | Admitting: Family Medicine

## 2022-07-27 NOTE — Telephone Encounter (Signed)
Requested Prescriptions  Pending Prescriptions Disp Refills   Potassium Chloride ER 20 MEQ TBCR [Pharmacy Med Name: Potassium Chloride ER 20 MEQ Oral Tablet Extended Release] 30 tablet 0    Sig: Take 1 tablet by mouth once daily     Endocrinology:  Minerals - Potassium Supplementation Failed - 07/25/2022  6:53 AM      Failed - Cr in normal range and within 360 days    Creat  Date Value Ref Range Status  01/29/2022 0.80 0.50 - 1.05 mg/dL Final   Creatinine, Ser  Date Value Ref Range Status  05/29/2022 1.31 (H) 0.57 - 1.00 mg/dL Final         Failed - Valid encounter within last 12 months    Recent Outpatient Visits           1 year ago Diarrhea, unspecified type   [facility] Medicine Donita Brooks, MD   1 year ago Diarrhea, unspecified type   [facility] Valentino Nose, NP   2 years ago Benign essential HTN   [facility] Donita Brooks, MD   2 years ago Current moderate episode of major depressive disorder without prior episode (HCC)   [facility] Pickard, Priscille Heidelberg, MD   2 years ago Status migrainosus   [facility] Medicine Pickard, Priscille Heidelberg, MD              Passed - K in normal range and within 360 days    Potassium  Date Value Ref Range Status  05/29/2022 4.3 3.5 - 5.2 mmol/L Final

## 2022-08-04 NOTE — Progress Notes (Unsigned)
Cardiology Office Note:    Date:  08/04/2022   ID:  Deborah Jennings, DOB 1960/07/01, MRN [id number]  PCP:  Donita Brooks, MD   [facility] Providers Cardiologist:  Alverda Skeans, MD Referring MD: Donita Brooks, MD   Chief Complaint/Reason for Referral:  Dyspnea  PATIENT DID NOT APPEAR FOR APPOINTMENT   ASSESSMENT:    1. Dyspnea on exertion   2. Precordial pain   3. Primary hypertension   4. Dyslipidemia   5. BMI 45.0-49.9, adult (HCC)   6. Type 2 diabetes mellitus without complication, without long-term current use of insulin (HCC)     PLAN:    In order of problems listed above: 1.  Dyspnea: Consider Jardiance 2.  Chest pain: Previously thought to be due to elevated LVEDP.  Consider CTA 3.  Hypertension:   4.  Dyslipidemia: Check lipid panel, LFTs, LP(a) today.   5.  Elevated BMI: We will refer to pharmacy for recommendations. 6.  T2DM:  Hba1c is > 6.5; will refer to pharmacy for recommendations regarding GLP1 RA.  Start ASA 81mg , continue valsartan and rosuvastatin.  Start Jardiance 10mg  qday.            Dispo:  No follow-ups on file.      Medication Adjustments/Labs and Tests Ordered: Current medicines are reviewed at length with the patient today.  Concerns regarding medicines are outlined above.  The following changes have been made:     Labs/tests ordered: No orders of the defined types were placed in this encounter.   Medication Changes: No orders of the defined types were placed in this encounter.      History of Present Illness:    FOCUSED PROBLEM LIST:   1.  Hyperlipidemia 2.  Hypertension  3.  Asthma 4.  BMI of 47   The patient is a 62 y.o. female with the indicated medical history here for recommendations regarding shortness of breath and chest discomfort.  The patient was seen by her primary care provider recently due to presence of also started on Lasix 40 mg twice daily.  Imitrex was discontinued and hydrochlorothiazide was  also stopped.  She tells me she had gained about 20 pounds worth of water weight over the last several months.  After starting Lasix she lost all that weight.  She feels much better.  She denies any exertional angina, orthopnea, or severe shortness of breath.  She occasionally wheezes and uses her albuterol inhaler.  She feels much better versus a few weeks ago after starting Lasix.   She tells me she started taking care of her mother who was declining in health over the last 7 years.  Recently she had to be placed in a facility and this is really depressed her.  Over the last several years she has not been able to exercise or take care of herself.   She used to smoke but quit about 30 years ago.  She has a 30-pack-year smoking history.  Plan:  Continue lasix, d/c nitroglycerin, refer to pharmacy given elevated BMI.       Previous Medical History: Past Medical History:  Diagnosis Date   ASCUS of cervix with negative high risk HPV 04/2017   Asthma    Back pain    low back pain   Endometriosis    IBS (irritable bowel syndrome)    Migraine      Current Medications: No outpatient medications have been marked as taking for the 08/05/22 encounter (Appointment) with Orbie Pyo,  MD.     Allergies:    Ampicillin   Social History:   Social History   Tobacco Use   Smoking status: Former    Types: Cigarettes    Quit date: 1986    Years since quitting: 38.5   Smokeless tobacco: Never  Vaping Use   Vaping Use: Never used  Substance Use Topics   Alcohol use: Yes    Alcohol/week: 5.0 standard drinks of alcohol    Types: 5 Standard drinks or equivalent per week   Drug use: Never     Family Hx: Family History  Problem Relation Age of Onset   Migraines Mother    Hypertension Mother    Dementia Mother    Migraines Father    Diabetes Father    Hypertension Father    Migraines Maternal Grandmother    Migraines Maternal Grandfather    Migraines Son      Review of Systems:    Please see the history of present illness.    All other systems reviewed and are negative.     EKGs/Labs/Other Test Reviewed:    EKG:    EKG Interpretation Date/Time:    Ventricular Rate:    PR Interval:    QRS Duration:    QT Interval:    QTC Calculation:   R Axis:      Text Interpretation:           Prior CV studies:  Cardiac Studies & Procedures       ECHOCARDIOGRAM  ECHOCARDIOGRAM COMPLETE 02/26/2022  Narrative ECHOCARDIOGRAM REPORT    Patient Name:   Deborah Jennings Date of Exam: 02/26/2022 Medical Rec #:  [id number]     Height:       62.0 in Accession #:    [id number]    Weight:       234.0 lb Date of Birth:  October 30, 1960    BSA:          2.044 m Patient Age:    61 years      BP:           138/84 mmHg Patient Gender: F             HR:           73 bpm. Exam Location:  [address]  Procedure: 2D Echo, Cardiac Doppler, Color Doppler and Intracardiac Opacification Agent  Indications:    R06.09 SOB  History:        Patient has no prior history of Echocardiogram examinations. Signs/Symptoms:Shortness of Breath and Chest Pain; Risk Factors:Hypertension and Dyslipidemia.  Sonographer:    Samule Ohm RDCS Referring Phys: [id number] Orbie Pyo   Sonographer Comments: Technically difficult study due to poor echo windows and patient is obese. Image acquisition challenging due to patient body habitus and Image acquisition challenging due to respiratory motion. IMPRESSIONS   1. Left ventricular ejection fraction, by estimation, is 60 to 65%. The left ventricle has normal function. The left ventricle has no regional wall motion abnormalities. Left ventricular diastolic parameters were normal. 2. Right ventricular systolic function is normal. The right ventricular size is normal. Tricuspid regurgitation signal is inadequate for assessing PA pressure. 3. A small pericardial effusion is present. The pericardial effusion is anterior to the right ventricle. 4.  The mitral valve is normal in structure. No evidence of mitral valve regurgitation. No evidence of mitral stenosis. 5. The aortic valve is tricuspid. Aortic valve regurgitation is not visualized. Aortic valve sclerosis/calcification is present, without any evidence of  aortic stenosis. 6. The inferior vena cava is normal in size with greater than 50% respiratory variability, suggesting right atrial pressure of 3 mmHg.  FINDINGS Left Ventricle: Left ventricular ejection fraction, by estimation, is 60 to 65%. The left ventricle has normal function. The left ventricle has no regional wall motion abnormalities. Definity contrast agent was given IV to delineate the left ventricular endocardial borders. The left ventricular internal cavity size was normal in size. There is no left ventricular hypertrophy. Left ventricular diastolic parameters were normal. Normal left ventricular filling pressure.  Right Ventricle: The right ventricular size is normal. No increase in right ventricular wall thickness. Right ventricular systolic function is normal. Tricuspid regurgitation signal is inadequate for assessing PA pressure.  Left Atrium: Left atrial size was normal in size.  Right Atrium: Right atrial size was normal in size.  Pericardium: A small pericardial effusion is present. The pericardial effusion is anterior to the right ventricle.  Mitral Valve: The mitral valve is normal in structure. No evidence of mitral valve regurgitation. No evidence of mitral valve stenosis.  Tricuspid Valve: The tricuspid valve is normal in structure. Tricuspid valve regurgitation is not demonstrated. No evidence of tricuspid stenosis.  Aortic Valve: The aortic valve is tricuspid. Aortic valve regurgitation is not visualized. Aortic valve sclerosis/calcification is present, without any evidence of aortic stenosis.  Pulmonic Valve: The pulmonic valve was normal in structure. Pulmonic valve regurgitation is not visualized. No  evidence of pulmonic stenosis.  Aorta: The aortic root is normal in size and structure.  Venous: The inferior vena cava is normal in size with greater than 50% respiratory variability, suggesting right atrial pressure of 3 mmHg.  IAS/Shunts: No atrial level shunt detected by color flow Doppler.   LEFT VENTRICLE PLAX 2D LVIDd:         3.90 cm   Diastology LVIDs:         2.80 cm   LV e' medial:    7.67 cm/s LV PW:         1.50 cm   LV E/e' medial:  11.4 LV IVS:        1.00 cm   LV e' lateral:   7.28 cm/s LVOT diam:     1.90 cm   LV E/e' lateral: 12.0 LV SV:         64 LV SV Index:   31 LVOT Area:     2.84 cm   RIGHT VENTRICLE             IVC RV S prime:     12.90 cm/s  IVC diam: 1.80 cm TAPSE (M-mode): 1.6 cm  LEFT ATRIUM             Index        RIGHT ATRIUM           Index LA diam:        3.30 cm 1.61 cm/m   RA Pressure: 3.00 mmHg LA Vol (A2C):   47.9 ml 23.44 ml/m  RA Area:     11.80 cm LA Vol (A4C):   29.1 ml 14.24 ml/m  RA Volume:   25.40 ml  12.43 ml/m LA Biplane Vol: 38.3 ml 18.74 ml/m AORTIC VALVE LVOT Vmax:   120.00 cm/s LVOT Vmean:  78.900 cm/s LVOT VTI:    0.227 m  AORTA Ao Root diam: 3.10 cm Ao Asc diam:  3.10 cm  MITRAL VALVE               TRICUSPID  VALVE MV Area (PHT): 3.34 cm    Estimated RAP:  3.00 mmHg MV Decel Time: 227 msec MV E velocity: 87.50 cm/s  SHUNTS MV A velocity: 92.80 cm/s  Systemic VTI:  0.23 m MV E/A ratio:  0.94        Systemic Diam: 1.90 cm  Armanda Magic MD Electronically signed by Armanda Magic MD Signature Date/Time: 02/26/2022/3:54:21 PM    Final             Other studies Reviewed: Review of the additional studies/records demonstrates: No aortic atherosclerosis or coronary artery calcification seen on imaging studies reviewed.  Recent Labs: 01/29/2022: Brain Natriuretic Peptide 44 05/29/2022: ALT 44; BUN 28; Creatinine, Ser 1.31; Hemoglobin 13.0; Platelets 215; Potassium 4.3; Sodium 142; TSH 1.180   Recent Lipid  Panel Lab Results  Component Value Date/Time   CHOL 284 (H) 11/10/2021 01:05 PM   TRIG 309 (H) 11/10/2021 01:05 PM   HDL 74 11/10/2021 01:05 PM   LDLCALC 162 (H) 11/10/2021 01:05 PM    Risk Assessment/Calculations:      Physical Exam:      Signed, Orbie Pyo, MD  08/04/2022 4:50 PM    [facility] [address] [place], [place], [place]  [address] Phone: [phone]; Fax: [phone]   Note:  This document was prepared using Dragon voice recognition software and may include unintentional dictation errors.

## 2022-08-05 ENCOUNTER — Ambulatory Visit (INDEPENDENT_AMBULATORY_CARE_PROVIDER_SITE_OTHER): Payer: 59 | Admitting: Internal Medicine

## 2022-08-05 DIAGNOSIS — Z6841 Body Mass Index (BMI) 40.0 and over, adult: Secondary | ICD-10-CM

## 2022-08-05 DIAGNOSIS — I10 Essential (primary) hypertension: Secondary | ICD-10-CM

## 2022-08-05 DIAGNOSIS — R0609 Other forms of dyspnea: Secondary | ICD-10-CM

## 2022-08-05 DIAGNOSIS — E119 Type 2 diabetes mellitus without complications: Secondary | ICD-10-CM

## 2022-08-05 DIAGNOSIS — E785 Hyperlipidemia, unspecified: Secondary | ICD-10-CM

## 2022-08-05 DIAGNOSIS — R072 Precordial pain: Secondary | ICD-10-CM

## 2022-08-30 ENCOUNTER — Other Ambulatory Visit: Payer: Self-pay | Admitting: Family Medicine

## 2022-08-30 DIAGNOSIS — I10 Essential (primary) hypertension: Secondary | ICD-10-CM

## 2022-09-01 NOTE — Telephone Encounter (Signed)
Requested Prescriptions  Pending Prescriptions Disp Refills   dicyclomine (BENTYL) 20 MG tablet [Pharmacy Med Name: Dicyclomine HCl 20 MG Oral Tablet] 90 tablet 1    Sig: TAKE 1 TABLET BY MOUTH EVERY 6 HOURS AS NEEDED     Gastroenterology:  Antispasmodic Agents Failed - 08/30/2022  3:12 PM      Failed - Valid encounter within last 12 months    Recent Outpatient Visits           2 years ago Diarrhea, unspecified type   [facility] Medicine Donita Brooks, MD   2 years ago Diarrhea, unspecified type   [facility] Medicine Valentino Nose, NP   2 years ago Benign essential HTN   [facility] Donita Brooks, MD   2 years ago Current moderate episode of major depressive disorder without prior episode (HCC)   [facility] Medicine Pickard, Priscille Heidelberg, MD   2 years ago Status migrainosus   [facility] Pickard, Priscille Heidelberg, MD              Refused Prescriptions Disp Refills   SUMAtriptan (IMITREX) 100 MG tablet [Pharmacy Med Name: SUMAtriptan Succinate 100 MG Oral Tablet] 10 tablet 0    Sig: TAKE 1 TABLET BY MOUTH EVERY 2 HOURS AS NEEDED FOR  MIGRAINE.  MAY  REPEAT  IN  2  HOURS  IF  HEADACHE  PERSISTS  OR  RECURS     Neurology:  Migraine Therapy - Triptan Failed - 08/30/2022  3:12 PM      Failed - Valid encounter within last 12 months    Recent Outpatient Visits           2 years ago Diarrhea, unspecified type   [facility] Medicine Donita Brooks, MD   2 years ago Diarrhea, unspecified type   [facility] Medicine Valentino Nose, NP   2 years ago Benign essential HTN   [facility] Donita Brooks, MD   2 years ago Current moderate episode of major depressive disorder without prior episode (HCC)   [facility] Medicine Pickard, Priscille Heidelberg, MD   2 years ago Status migrainosus   [facility] Pickard, Priscille Heidelberg, MD              Passed - Last BP in normal  range    BP Readings from Last 1 Encounters:  05/29/22 115/79

## 2022-09-29 ENCOUNTER — Other Ambulatory Visit: Payer: Self-pay | Admitting: Family Medicine

## 2022-09-29 DIAGNOSIS — G43709 Chronic migraine without aura, not intractable, without status migrainosus: Secondary | ICD-10-CM

## 2022-09-30 ENCOUNTER — Other Ambulatory Visit: Payer: Self-pay | Admitting: Family Medicine

## 2022-09-30 NOTE — Telephone Encounter (Signed)
Last OV 04/14/22.  Requested Prescriptions  Pending Prescriptions Disp Refills   topiramate (TOPAMAX) 25 MG tablet [Pharmacy Med Name: Topiramate 25 MG Oral Tablet] 180 tablet 0    Sig: Take 1 tablet by mouth twice daily     Neurology: Anticonvulsants - topiramate & zonisamide Failed - 09/29/2022 11:56 AM      Failed - Cr in normal range and within 360 days    Creat  Date Value Ref Range Status  01/29/2022 0.80 0.50 - 1.05 mg/dL Final   Creatinine, Ser  Date Value Ref Range Status  05/29/2022 1.31 (H) 0.57 - 1.00 mg/dL Final         Failed - ALT in normal range and within 360 days    ALT  Date Value Ref Range Status  05/29/2022 44 (H) 0 - 32 IU/L Final         Failed - AST in normal range and within 360 days    AST  Date Value Ref Range Status  05/29/2022 84 (H) 0 - 40 IU/L Final         Failed - Valid encounter within last 12 months    Recent Outpatient Visits           2 years ago Diarrhea, unspecified type   [facility] Medicine Donita Brooks, MD   2 years ago Diarrhea, unspecified type   [facility] Medicine Valentino Nose, NP   2 years ago Benign essential HTN   [facility] Medicine Donita Brooks, MD   2 years ago Current moderate episode of major depressive disorder without prior episode (HCC)   [facility] Medicine Pickard, Priscille Heidelberg, MD   2 years ago Status migrainosus   [facility] Medicine Pickard, Priscille Heidelberg, MD              Passed - CO2 in normal range and within 360 days    CO2  Date Value Ref Range Status  05/29/2022 25 20 - 29 mmol/L Final         Passed - Completed PHQ-2 or PHQ-9 in the last 360 days

## 2022-10-01 NOTE — Telephone Encounter (Signed)
Requested by interface surescripts. Last OV 04/23/22.  Requested Prescriptions  Pending Prescriptions Disp Refills   furosemide (LASIX) 40 MG tablet [Pharmacy Med Name: Furosemide 40 MG Oral Tablet] 60 tablet 0    Sig: Take 1 tablet by mouth twice daily     Cardiovascular:  Diuretics - Loop Failed - 09/30/2022  6:53 AM      Failed - Cr in normal range and within 180 days    Creat  Date Value Ref Range Status  01/29/2022 0.80 0.50 - 1.05 mg/dL Final   Creatinine, Ser  Date Value Ref Range Status  05/29/2022 1.31 (H) 0.57 - 1.00 mg/dL Final         Failed - Mg Level in normal range and within 180 days    No results found for: "MG"       Failed - Valid encounter within last 6 months    Recent Outpatient Visits           2 years ago Diarrhea, unspecified type   [facility] Medicine Donita Brooks, MD   2 years ago Diarrhea, unspecified type   [facility] Medicine Valentino Nose, NP   2 years ago Benign essential HTN   [facility] Pickard, Priscille Heidelberg, MD   2 years ago Current moderate episode of major depressive disorder without prior episode (HCC)   [facility] Medicine Pickard, Priscille Heidelberg, MD   2 years ago Status migrainosus   [facility] Medicine Pickard, Priscille Heidelberg, MD              Passed - K in normal range and within 180 days    Potassium  Date Value Ref Range Status  05/29/2022 4.3 3.5 - 5.2 mmol/L Final         Passed - Ca in normal range and within 180 days    Calcium  Date Value Ref Range Status  05/29/2022 9.3 8.7 - 10.3 mg/dL Final         Passed - Na in normal range and within 180 days    Sodium  Date Value Ref Range Status  05/29/2022 142 134 - 144 mmol/L Final         Passed - Cl in normal range and within 180 days    Chloride  Date Value Ref Range Status  05/29/2022 99 96 - 106 mmol/L Final         Passed - Last BP in normal range    BP Readings from Last 1 Encounters:  05/29/22 115/79           Potassium Chloride ER 20 MEQ TBCR [Pharmacy Med Name: Potassium Chloride ER 20 MEQ Oral Tablet Extended Release] 30 tablet 0    Sig: Take 1 tablet by mouth once daily     Endocrinology:  Minerals - Potassium Supplementation Failed - 09/30/2022  6:53 AM      Failed - Cr in normal range and within 360 days    Creat  Date Value Ref Range Status  01/29/2022 0.80 0.50 - 1.05 mg/dL Final   Creatinine, Ser  Date Value Ref Range Status  05/29/2022 1.31 (H) 0.57 - 1.00 mg/dL Final         Failed - Valid encounter within last 12 months    Recent Outpatient Visits           2 years ago Diarrhea, unspecified type   [facility] Medicine Pickard, Priscille Heidelberg, MD   2  years ago Diarrhea, unspecified type   [facility] Medicine Valentino Nose, NP   2 years ago Benign essential HTN   [facility] Medicine Donita Brooks, MD   2 years ago Current moderate episode of major depressive disorder without prior episode [facility])   [facility] Medicine Pickard, Priscille Heidelberg, MD   2 years ago Status migrainosus   [facility] Medicine Pickard, Priscille Heidelberg, MD              Passed - K in normal range and within 360 days    Potassium  Date Value Ref Range Status  05/29/2022 4.3 3.5 - 5.2 mmol/L Final

## 2022-10-04 ENCOUNTER — Other Ambulatory Visit: Payer: Self-pay | Admitting: Family Medicine

## 2022-10-07 NOTE — Telephone Encounter (Signed)
Unable to refill per protocol, Rx expired. Discontinued 05/29/22.  Requested Prescriptions  Pending Prescriptions Disp Refills   SUMAtriptan (IMITREX) 100 MG tablet [Pharmacy Med Name: SUMAtriptan Succinate 100 MG Oral Tablet] 10 tablet 0    Sig: TAKE 1 TABLET BY MOUTH EVERY 2 HOURS AS NEEDED FOR  MIGRAINE.  MAY  REPEAT  IN  2  HOURS  IF  HEADACHE  PERSISTS  OR  RECURS     Neurology:  Migraine Therapy - Triptan Failed - 10/04/2022  5:23 PM      Failed - Valid encounter within last 12 months    Recent Outpatient Visits           2 years ago Diarrhea, unspecified type   [facility] Medicine Donita Brooks, MD   2 years ago Diarrhea, unspecified type   [facility] Valentino Nose, NP   2 years ago Benign essential HTN   [facility] Medicine Donita Brooks, MD   2 years ago Current moderate episode of major depressive disorder without prior episode (HCC)   [facility] Medicine Pickard, Priscille Heidelberg, MD   2 years ago Status migrainosus   [facility] Medicine Pickard, Priscille Heidelberg, MD              Passed - Last BP in normal range    BP Readings from Last 1 Encounters:  05/29/22 115/79

## 2022-10-15 ENCOUNTER — Ambulatory Visit: Payer: PRIVATE HEALTH INSURANCE | Admitting: Adult Health

## 2022-10-19 ENCOUNTER — Other Ambulatory Visit: Payer: Self-pay | Admitting: Family Medicine

## 2022-10-21 NOTE — Telephone Encounter (Signed)
Requested Prescriptions  Pending Prescriptions Disp Refills   clonazePAM (KLONOPIN) 0.5 MG tablet [Pharmacy Med Name: clonazePAM 0.5 MG Oral Tablet] 90 tablet     Sig: TAKE 1 TABLET BY MOUTH THREE TIMES DAILY AS NEEDED FOR ANXIETY     Not Delegated - Psychiatry: Anxiolytics/Hypnotics 2 Failed - 10/19/2022  6:48 PM      Failed - This refill cannot be delegated      Failed - Urine Drug Screen completed in last 360 days      Failed - Valid encounter within last 6 months    Recent Outpatient Visits           2 years ago Diarrhea, unspecified type   [facility] Medicine Donita Brooks, MD   2 years ago Diarrhea, unspecified type   [facility] Medicine Valentino Nose, NP   2 years ago Benign essential HTN   [facility] Medicine Donita Brooks, MD   2 years ago Current moderate episode of major depressive disorder without prior episode (HCC)   [facility] Medicine Pickard, Priscille Heidelberg, MD   2 years ago Status migrainosus   [facility] Medicine Pickard, Priscille Heidelberg, MD              Passed - Patient is not pregnant      Refused Prescriptions Disp Refills   SUMAtriptan (IMITREX) 100 MG tablet [Pharmacy Med Name: SUMAtriptan Succinate 100 MG Oral Tablet] 10 tablet 0    Sig: TAKE 1 TABLET BY MOUTH EVERY 2 HOURS AS NEEDED FOR MIGRAINE.MAY REPEAT IN 2 HOURS IF HEADACHE PERSISTS OR RECURS.     Neurology:  Migraine Therapy - Triptan Failed - 10/19/2022  6:48 PM      Failed - Valid encounter within last 12 months    Recent Outpatient Visits           2 years ago Diarrhea, unspecified type   [facility] Medicine Tanya Nones, Priscille Heidelberg, MD   2 years ago Diarrhea, unspecified type   [facility] Medicine Valentino Nose, NP   2 years ago Benign essential HTN   [facility] Donita Brooks, MD   2 years ago Current moderate episode of major depressive disorder without prior episode (HCC)   [facility]  Medicine Pickard, Priscille Heidelberg, MD   2 years ago Status migrainosus   [facility] Medicine Pickard, Priscille Heidelberg, MD              Passed - Last BP in normal range    BP Readings from Last 1 Encounters:  05/29/22 115/79

## 2022-10-21 NOTE — Telephone Encounter (Signed)
Requested medication (s) are due for refill today: yes  Requested medication (s) are on the active medication list: yes  Last refill:  05/26/22#90  Future visit scheduled: no  Notes to clinic:  med not delegated to NT to RF   Requested Prescriptions  Pending Prescriptions Disp Refills   clonazePAM (KLONOPIN) 0.5 MG tablet [Pharmacy Med Name: clonazePAM 0.5 MG Oral Tablet] 90 tablet     Sig: TAKE 1 TABLET BY MOUTH THREE TIMES DAILY AS NEEDED FOR ANXIETY     Not Delegated - Psychiatry: Anxiolytics/Hypnotics 2 Failed - 10/19/2022  6:48 PM      Failed - This refill cannot be delegated      Failed - Urine Drug Screen completed in last 360 days      Failed - Valid encounter within last 6 months    Recent Outpatient Visits           2 years ago Diarrhea, unspecified type   [facility] Medicine Donita Brooks, MD   2 years ago Diarrhea, unspecified type   [facility] Medicine Valentino Nose, NP   2 years ago Benign essential HTN   [facility] Medicine Donita Brooks, MD   2 years ago Current moderate episode of major depressive disorder without prior episode (HCC)   [facility] Medicine Pickard, Priscille Heidelberg, MD   2 years ago Status migrainosus   [facility] Medicine Pickard, Priscille Heidelberg, MD              Passed - Patient is not pregnant      Refused Prescriptions Disp Refills   SUMAtriptan (IMITREX) 100 MG tablet [Pharmacy Med Name: SUMAtriptan Succinate 100 MG Oral Tablet] 10 tablet 0    Sig: TAKE 1 TABLET BY MOUTH EVERY 2 HOURS AS NEEDED FOR MIGRAINE.MAY REPEAT IN 2 HOURS IF HEADACHE PERSISTS OR RECURS.     Neurology:  Migraine Therapy - Triptan Failed - 10/19/2022  6:48 PM      Failed - Valid encounter within last 12 months    Recent Outpatient Visits           2 years ago Diarrhea, unspecified type   [facility] Medicine Tanya Nones, Priscille Heidelberg, MD   2 years ago Diarrhea, unspecified type   [facility] Medicine  Valentino Nose, NP   2 years ago Benign essential HTN   [facility] Medicine Donita Brooks, MD   2 years ago Current moderate episode of major depressive disorder without prior episode (HCC)   [facility] Medicine Pickard, Priscille Heidelberg, MD   2 years ago Status migrainosus   [facility] Medicine Pickard, Priscille Heidelberg, MD              Passed - Last BP in normal range    BP Readings from Last 1 Encounters:  05/29/22 115/79

## 2022-10-27 ENCOUNTER — Other Ambulatory Visit: Payer: Self-pay | Admitting: Family Medicine

## 2022-10-27 DIAGNOSIS — I10 Essential (primary) hypertension: Secondary | ICD-10-CM

## 2022-10-28 ENCOUNTER — Other Ambulatory Visit: Payer: Self-pay | Admitting: Family Medicine

## 2022-10-28 NOTE — Telephone Encounter (Signed)
Request is too soon, last refill 09/01/22 for 90 and 1 refill.  Requested Prescriptions  Pending Prescriptions Disp Refills   dicyclomine (BENTYL) 20 MG tablet [Pharmacy Med Name: Dicyclomine HCl 20 MG Oral Tablet] 90 tablet 0    Sig: TAKE 1 TABLET BY MOUTH EVERY 6 HOURS AS NEEDED     Gastroenterology:  Antispasmodic Agents Failed - 10/27/2022 10:55 AM      Failed - Valid encounter within last 12 months    Recent Outpatient Visits           2 years ago Diarrhea, unspecified type   [facility] Medicine Donita Brooks, MD   2 years ago Diarrhea, unspecified type   [facility] Medicine Valentino Nose, NP   2 years ago Benign essential HTN   [facility] Donita Brooks, MD   2 years ago Current moderate episode of major depressive disorder without prior episode (HCC)   [facility] Medicine Pickard, Priscille Heidelberg, MD   2 years ago Status migrainosus   [facility] Family Medicine Pickard, Priscille Heidelberg, MD

## 2022-10-29 NOTE — Telephone Encounter (Signed)
Requested Prescriptions  Pending Prescriptions Disp Refills   escitalopram (LEXAPRO) 20 MG tablet [Pharmacy Med Name: Escitalopram Oxalate 20 MG Oral Tablet] 30 tablet 0    Sig: Take 1 tablet by mouth once daily     Psychiatry:  Antidepressants - SSRI Failed - 10/28/2022  6:53 AM      Failed - Valid encounter within last 6 months    Recent Outpatient Visits           2 years ago Diarrhea, unspecified type   [facility] Medicine Donita Brooks, MD   2 years ago Diarrhea, unspecified type   [facility] Medicine Valentino Nose, NP   2 years ago Benign essential HTN   [facility] Tanya Nones, Priscille Heidelberg, MD   2 years ago Current moderate episode of major depressive disorder without prior episode (HCC)   [facility] Medicine Pickard, Priscille Heidelberg, MD   2 years ago Status migrainosus   [facility] Pickard, Priscille Heidelberg, MD               furosemide (LASIX) 40 MG tablet [Pharmacy Med Name: Furosemide 40 MG Oral Tablet] 60 tablet 2    Sig: Take 1 tablet by mouth twice daily     Cardiovascular:  Diuretics - Loop Failed - 10/28/2022  6:53 AM      Failed - Cr in normal range and within 180 days    Creat  Date Value Ref Range Status  01/29/2022 0.80 0.50 - 1.05 mg/dL Final   Creatinine, Ser  Date Value Ref Range Status  05/29/2022 1.31 (H) 0.57 - 1.00 mg/dL Final         Failed - Mg Level in normal range and within 180 days    No results found for: "MG"       Failed - Valid encounter within last 6 months    Recent Outpatient Visits           2 years ago Diarrhea, unspecified type   [facility] Medicine Donita Brooks, MD   2 years ago Diarrhea, unspecified type   [facility] Medicine Valentino Nose, NP   2 years ago Benign essential HTN   [facility] Medicine Pickard, Priscille Heidelberg, MD   2 years ago Current moderate episode of major depressive disorder without prior episode (HCC)   [facility]  Family Medicine Pickard, Priscille Heidelberg, MD   2 years ago Status migrainosus   [facility] Medicine Pickard, Priscille Heidelberg, MD              Passed - K in normal range and within 180 days    Potassium  Date Value Ref Range Status  05/29/2022 4.3 3.5 - 5.2 mmol/L Final         Passed - Ca in normal range and within 180 days    Calcium  Date Value Ref Range Status  05/29/2022 9.3 8.7 - 10.3 mg/dL Final         Passed - Na in normal range and within 180 days    Sodium  Date Value Ref Range Status  05/29/2022 142 134 - 144 mmol/L Final         Passed - Cl in normal range and within 180 days    Chloride  Date Value Ref Range Status  05/29/2022 99 96 - 106 mmol/L Final         Passed - Last BP in normal  range    BP Readings from Last 1 Encounters:  05/29/22 115/79          Potassium Chloride ER 20 MEQ TBCR [Pharmacy Med Name: Potassium Chloride ER 20 MEQ Oral Tablet Extended Release] 90 tablet 1    Sig: Take 1 tablet by mouth once daily     Endocrinology:  Minerals - Potassium Supplementation Failed - 10/28/2022  6:53 AM      Failed - Cr in normal range and within 360 days    Creat  Date Value Ref Range Status  01/29/2022 0.80 0.50 - 1.05 mg/dL Final   Creatinine, Ser  Date Value Ref Range Status  05/29/2022 1.31 (H) 0.57 - 1.00 mg/dL Final         Failed - Valid encounter within last 12 months    Recent Outpatient Visits           2 years ago Diarrhea, unspecified type   [facility] Medicine Donita Brooks, MD   2 years ago Diarrhea, unspecified type   [facility] Medicine Valentino Nose, NP   2 years ago Benign essential HTN   [facility] Medicine Donita Brooks, MD   2 years ago Current moderate episode of major depressive disorder without prior episode (HCC)   [facility] Medicine Pickard, Priscille Heidelberg, MD   2 years ago Status migrainosus   [facility] Medicine Pickard, Priscille Heidelberg, MD              Passed -  K in normal range and within 360 days    Potassium  Date Value Ref Range Status  05/29/2022 4.3 3.5 - 5.2 mmol/L Final

## 2022-11-09 ENCOUNTER — Encounter: Payer: Self-pay | Admitting: Neurology

## 2022-11-10 ENCOUNTER — Encounter: Payer: Self-pay | Admitting: Neurology

## 2022-11-12 ENCOUNTER — Ambulatory Visit: Payer: 59 | Admitting: Family Medicine

## 2022-11-15 ENCOUNTER — Other Ambulatory Visit: Payer: 59

## 2022-11-16 ENCOUNTER — Ambulatory Visit
Admission: RE | Admit: 2022-11-16 | Discharge: 2022-11-16 | Disposition: A | Discharge status: Home / Self Care | Payer: No Typology Code available for payment source | Source: Ambulatory Visit | Attending: Neurology | Admitting: Neurology

## 2022-11-16 DIAGNOSIS — R519 Headache, unspecified: Secondary | ICD-10-CM | POA: Diagnosis not present

## 2022-11-16 DIAGNOSIS — H539 Unspecified visual disturbance: Secondary | ICD-10-CM

## 2022-11-16 DIAGNOSIS — R51 Headache with orthostatic component, not elsewhere classified: Secondary | ICD-10-CM

## 2022-11-16 MED ORDER — GADOPICLENOL 0.5 MMOL/ML IV SOLN
10.0000 mL | Freq: Once | INTRAVENOUS | Status: AC | PRN
  Administered 2022-11-16: 10 mL via INTRAVENOUS

## 2022-11-17 ENCOUNTER — Ambulatory Visit: Payer: 59 | Admitting: Family Medicine

## 2022-11-22 ENCOUNTER — Other Ambulatory Visit: Payer: Self-pay | Admitting: Family Medicine

## 2022-11-22 DIAGNOSIS — E7800 Pure hypercholesterolemia, unspecified: Secondary | ICD-10-CM

## 2022-11-22 DIAGNOSIS — I10 Essential (primary) hypertension: Secondary | ICD-10-CM

## 2022-11-24 NOTE — Telephone Encounter (Signed)
Requested by interface surescripts. Future visit in 2 days. Last OV 04/23/22. Last labs 11/10/21.  Requested Prescriptions  Pending Prescriptions Disp Refills   dicyclomine (BENTYL) 20 MG tablet [Pharmacy Med Name: Dicyclomine HCl 20 MG Oral Tablet] 90 tablet 0    Sig: TAKE 1 TABLET BY MOUTH EVERY 6 HOURS AS NEEDED     Gastroenterology:  Antispasmodic Agents Failed - 11/22/2022  5:38 PM      Failed - Valid encounter within last 12 months    Recent Outpatient Visits           2 years ago Diarrhea, unspecified type   [facility] Medicine Donita Brooks, MD   2 years ago Diarrhea, unspecified type   [facility] Medicine Valentino Nose, NP   2 years ago Benign essential HTN   [facility] Donita Brooks, MD   2 years ago Current moderate episode of major depressive disorder without prior episode (HCC)   [facility] Medicine Pickard, Priscille Heidelberg, MD   2 years ago Status migrainosus   [facility] Medicine Pickard, Priscille Heidelberg, MD       Future Appointments             In 2 days Pickard, Priscille Heidelberg, MD [facility], [facility]   In 2 weeks Lynnette Caffey, Charlies Constable, MD [facility]             SUMAtriptan (IMITREX) 100 MG tablet [Pharmacy Med Name: SUMAtriptan Succinate 100 MG Oral Tablet] 10 tablet 0    Sig: TAKE 1 TABLET BY MOUTH EVERY 2 HOURS AS NEEDED FOR  MIGRAINE.  MAY  REPEAT  IN  2  HOURS  IF  HEADACHE  PERSISTS  OR  RECURS     Neurology:  Migraine Therapy - Triptan Failed - 11/22/2022  5:38 PM      Failed - Valid encounter within last 12 months    Recent Outpatient Visits           2 years ago Diarrhea, unspecified type   [facility] Donita Brooks, MD   2 years ago Diarrhea, unspecified type   [facility] Medicine Valentino Nose, NP   2 years ago Benign essential HTN   [facility] Medicine Donita Brooks, MD   2 years ago  Current moderate episode of major depressive disorder without prior episode (HCC)   [facility] Family Medicine Pickard, Priscille Heidelberg, MD   2 years ago Status migrainosus   [facility] Medicine Pickard, Priscille Heidelberg, MD       Future Appointments             In 2 days Donita Brooks, MD [facility], [facility]   In 2 weeks Orbie Pyo, MD [facility]            Passed - Last BP in normal range    BP Readings from Last 1 Encounters:  05/29/22 115/79          rosuvastatin (CRESTOR) 10 MG tablet [Pharmacy Med Name: Rosuvastatin Calcium 10 MG Oral Tablet] 30 tablet 0    Sig: Take 1 tablet by mouth once daily     Cardiovascular:  Antilipid - Statins 2 Failed - 11/22/2022  5:38 PM      Failed - Cr in normal range and within 360 days  Creat  Date Value Ref Range Status  01/29/2022 0.80 0.50 - 1.05 mg/dL Final   Creatinine, Ser  Date Value Ref Range Status  05/29/2022 1.31 (H) 0.57 - 1.00 mg/dL Final         Failed - Valid encounter within last 12 months    Recent Outpatient Visits           2 years ago Diarrhea, unspecified type   [facility] Medicine Donita Brooks, MD   2 years ago Diarrhea, unspecified type   [facility] Medicine Valentino Nose, NP   2 years ago Benign essential HTN   [facility] Family Medicine Donita Brooks, MD   2 years ago Current moderate episode of major depressive disorder without prior episode (HCC)   [facility] Medicine Pickard, Priscille Heidelberg, MD   2 years ago Status migrainosus   [facility] Medicine Pickard, Priscille Heidelberg, MD       Future Appointments             In 2 days Pickard, Priscille Heidelberg, MD [facility], [facility]   In 2 weeks Orbie Pyo, MD [facility] at [facility], [facility]            Failed - Lipid Panel in normal range within the last 12 months    Cholesterol  Date  Value Ref Range Status  11/10/2021 284 (H) <200 mg/dL Final   LDL Cholesterol (Calc)  Date Value Ref Range Status  11/10/2021 162 (H) mg/dL (calc) Final    Comment:    Reference range: <100 . Desirable range <100 mg/dL for primary prevention;   <70 mg/dL for patients with CHD or diabetic patients  with > or = 2 CHD risk factors. Marland Kitchen LDL-C is now calculated using the Martin-Hopkins  calculation, which is a validated novel method providing  better accuracy than the Friedewald equation in the  estimation of LDL-C.  Horald Pollen et al. Lenox Ahr. 1610;960(45): 2061-2068  (http://education.[employer].com/faq/FAQ164)    HDL  Date Value Ref Range Status  11/10/2021 74 > OR = 50 mg/dL Final   Triglycerides  Date Value Ref Range Status  11/10/2021 309 (H) <150 mg/dL Final    Comment:    . If a non-fasting specimen was collected, consider repeat triglyceride testing on a fasting specimen if clinically indicated.  Perry Mount et al. J. of Clin. Lipidol. 2015;9:129-169. Marland Kitchen          Passed - Patient is not pregnant

## 2022-11-24 NOTE — Telephone Encounter (Signed)
Requested by interface surescripts. Medication discontinued 05/29/22.  Requested Prescriptions  Signed Prescriptions Disp Refills   dicyclomine (BENTYL) 20 MG tablet 90 tablet 0    Sig: TAKE 1 TABLET BY MOUTH EVERY 6 HOURS AS NEEDED     Gastroenterology:  Antispasmodic Agents Failed - 11/22/2022  5:38 PM      Failed - Valid encounter within last 12 months    Recent Outpatient Visits           2 years ago Diarrhea, unspecified type   [facility] Medicine Donita Brooks, MD   2 years ago Diarrhea, unspecified type   [facility] Medicine Valentino Nose, NP   2 years ago Benign essential HTN   [facility] Medicine Donita Brooks, MD   2 years ago Current moderate episode of major depressive disorder without prior episode (HCC)   [facility] Medicine Pickard, Priscille Heidelberg, MD   2 years ago Status migrainosus   [facility] Pickard, Priscille Heidelberg, MD       Future Appointments             In 2 days Donita Brooks, MD [facility], [facility]   In 2 weeks Orbie Pyo, MD [facility] Heart[facility] at [facility]             rosuvastatin (CRESTOR) 10 MG tablet 30 tablet 0    Sig: Take 1 tablet by mouth once daily     Cardiovascular:  Antilipid - Statins 2 Failed - 11/22/2022  5:38 PM      Failed - Cr in normal range and within 360 days    Creat  Date Value Ref Range Status  01/29/2022 0.80 0.50 - 1.05 mg/dL Final   Creatinine, Ser  Date Value Ref Range Status  05/29/2022 1.31 (H) 0.57 - 1.00 mg/dL Final         Failed - Valid encounter within last 12 months    Recent Outpatient Visits           2 years ago Diarrhea, unspecified type   [facility] Medicine Donita Brooks, MD   2 years ago Diarrhea, unspecified type   [facility] Medicine Valentino Nose, NP   2 years ago Benign essential HTN   [facility] Medicine Donita Brooks, MD   2 years  ago Current moderate episode of major depressive disorder without prior episode (HCC)   [facility] Medicine Pickard, Priscille Heidelberg, MD   2 years ago Status migrainosus   [facility] Medicine Pickard, Priscille Heidelberg, MD       Future Appointments             In 2 days Pickard, Priscille Heidelberg, MD [facility], [facility]   In 2 weeks Orbie Pyo, MD [facility] at [facility]            Failed - Lipid Panel in normal range within the last 12 months    Cholesterol  Date Value Ref Range Status  11/10/2021 284 (H) <200 mg/dL Final   LDL Cholesterol (Calc)  Date Value Ref Range Status  11/10/2021 162 (H) mg/dL (calc) Final    Comment:    Reference range: <100 . Desirable range <100 mg/dL for primary prevention;   <70 mg/dL for patients with CHD or diabetic patients  with > or = 2 CHD risk factors. Marland Kitchen  LDL-C is now calculated using the Martin-Hopkins  calculation, which is a validated novel method providing  better accuracy than the Friedewald equation in the  estimation of LDL-C.  Horald Pollen et al. Lenox Ahr. 2202;542(70): 2061-2068  (http://education.[facility].com/faq/FAQ164)    HDL  Date Value Ref Range Status  11/10/2021 74 > OR = 50 mg/dL Final   Triglycerides  Date Value Ref Range Status  11/10/2021 309 (H) <150 mg/dL Final    Comment:    . If a non-fasting specimen was collected, consider repeat triglyceride testing on a fasting specimen if clinically indicated.  Perry Mount et al. J. of Clin. Lipidol. 2015;9:129-169. Marland Kitchen          Passed - Patient is not pregnant      Refused Prescriptions Disp Refills   SUMAtriptan (IMITREX) 100 MG tablet [Pharmacy Med Name: SUMAtriptan Succinate 100 MG Oral Tablet] 10 tablet 0    Sig: TAKE 1 TABLET BY MOUTH EVERY 2 HOURS AS NEEDED FOR  MIGRAINE.  MAY  REPEAT  IN  2  HOURS  IF  HEADACHE  PERSISTS  OR  RECURS     Neurology:  Migraine Therapy - Triptan Failed - 11/22/2022   5:38 PM      Failed - Valid encounter within last 12 months    Recent Outpatient Visits           2 years ago Diarrhea, unspecified type   [facility] Medicine Donita Brooks, MD   2 years ago Diarrhea, unspecified type   [facility] Valentino Nose, NP   2 years ago Benign essential HTN   [facility] Medicine Donita Brooks, MD   2 years ago Current moderate episode of major depressive disorder without prior episode (HCC)   [facility] Medicine Pickard, Priscille Heidelberg, MD   2 years ago Status migrainosus   [facility] Medicine Pickard, Priscille Heidelberg, MD       Future Appointments             In 2 days Pickard, Priscille Heidelberg, MD [facility], [facility]   In 2 weeks Orbie Pyo, MD [facility] at [facility]            Passed - Last BP in normal range    BP Readings from Last 1 Encounters:  05/29/22 115/79

## 2022-11-26 ENCOUNTER — Ambulatory Visit: Payer: 59 | Admitting: Family Medicine

## 2022-12-03 ENCOUNTER — Ambulatory Visit: Payer: 59 | Admitting: Family Medicine

## 2022-12-09 ENCOUNTER — Ambulatory Visit: Payer: 59 | Attending: Internal Medicine | Admitting: Internal Medicine

## 2022-12-26 ENCOUNTER — Other Ambulatory Visit: Payer: Self-pay | Admitting: Family Medicine

## 2022-12-26 DIAGNOSIS — E7800 Pure hypercholesterolemia, unspecified: Secondary | ICD-10-CM

## 2022-12-28 NOTE — Telephone Encounter (Signed)
Requested medication (s) are due for refill today: yes  Requested medication (s) are on the active medication list: yes  Last refill:  11/24/22 #30/0  Future visit scheduled: no  Notes to clinic:  Unable to refill per protocol due to failed labs, no updated results.      Requested Prescriptions  Pending Prescriptions Disp Refills   rosuvastatin (CRESTOR) 10 MG tablet [Pharmacy Med Name: ROSUVASTATIN 10MG  TAB] 30 tablet 0    Sig: Take 1 tablet by mouth once daily     Cardiovascular:  Antilipid - Statins 2 Failed - 12/26/2022  6:52 AM      Failed - Cr in normal range and within 360 days    Creat  Date Value Ref Range Status  01/29/2022 0.80 0.50 - 1.05 mg/dL Final   Creatinine, Ser  Date Value Ref Range Status  05/29/2022 1.31 (H) 0.57 - 1.00 mg/dL Final         Failed - Valid encounter within last 12 months    Recent Outpatient Visits           2 years ago Diarrhea, unspecified type   [facility] Donita Brooks, MD   2 years ago Diarrhea, unspecified type   [facility] Medicine Valentino Nose, NP   2 years ago Benign essential HTN   [facility] Medicine Donita Brooks, MD   2 years ago Current moderate episode of major depressive disorder without prior episode (HCC)   [facility] Medicine Pickard, Priscille Heidelberg, MD   2 years ago Status migrainosus   [facility] Pickard, Priscille Heidelberg, MD              Failed - Lipid Panel in normal range within the last 12 months    Cholesterol  Date Value Ref Range Status  11/10/2021 284 (H) <200 mg/dL Final   LDL Cholesterol (Calc)  Date Value Ref Range Status  11/10/2021 162 (H) mg/dL (calc) Final    Comment:    Reference range: <100 . Desirable range <100 mg/dL for primary prevention;   <70 mg/dL for patients with CHD or diabetic patients  with > or = 2 CHD risk factors. Marland Kitchen LDL-C is now calculated using the Martin-Hopkins  calculation, which is a validated  novel method providing  better accuracy than the Friedewald equation in the  estimation of LDL-C.  Horald Pollen et al. Lenox Ahr. 0981;191(47): 2061-2068  (http://education.[facility].com/faq/FAQ164)    HDL  Date Value Ref Range Status  11/10/2021 74 > OR = 50 mg/dL Final   Triglycerides  Date Value Ref Range Status  11/10/2021 309 (H) <150 mg/dL Final    Comment:    . If a non-fasting specimen was collected, consider repeat triglyceride testing on a fasting specimen if clinically indicated.  Perry Mount et al. J. of Clin. Lipidol. 2015;9:129-169. Marland Kitchen          Passed - Patient is not pregnant

## 2023-01-05 ENCOUNTER — Other Ambulatory Visit: Payer: Self-pay

## 2023-01-05 DIAGNOSIS — E7800 Pure hypercholesterolemia, unspecified: Secondary | ICD-10-CM

## 2023-01-05 MED ORDER — ROSUVASTATIN CALCIUM 10 MG PO TABS: 10.0000 mg | ORAL_TABLET | Freq: Every day | ORAL | 0 refills | Status: DC

## 2023-01-13 ENCOUNTER — Encounter: Payer: Self-pay | Admitting: Internal Medicine

## 2023-01-14 ENCOUNTER — Ambulatory Visit: Payer: PRIVATE HEALTH INSURANCE | Admitting: Adult Health

## 2023-01-14 ENCOUNTER — Telehealth: Payer: Self-pay | Admitting: Neurology

## 2023-01-14 NOTE — Telephone Encounter (Signed)
Pt called to r/s appt due to having a severe migraine. Appt scheduled for 08/2023, pt is requesting earlier appt and/or if different medication can be sent to pharmacy. Requesting call back

## 2023-01-18 ENCOUNTER — Other Ambulatory Visit: Payer: Self-pay | Admitting: Family Medicine

## 2023-01-18 DIAGNOSIS — I10 Essential (primary) hypertension: Secondary | ICD-10-CM

## 2023-01-18 DIAGNOSIS — L989 Disorder of the skin and subcutaneous tissue, unspecified: Secondary | ICD-10-CM

## 2023-01-18 DIAGNOSIS — G43709 Chronic migraine without aura, not intractable, without status migrainosus: Secondary | ICD-10-CM

## 2023-01-19 ENCOUNTER — Other Ambulatory Visit: Payer: Self-pay | Admitting: Family Medicine

## 2023-01-19 DIAGNOSIS — I10 Essential (primary) hypertension: Secondary | ICD-10-CM

## 2023-01-19 NOTE — Telephone Encounter (Signed)
Requested medication (s) are due for refill today: Yes  Requested medication (s) are on the active medication list: Yes  Last refill:    Future visit scheduled: No  Notes to clinic:  Protocol indicates pt. Needs OV.    Requested Prescriptions  Pending Prescriptions Disp Refills   valACYclovir (VALTREX) 500 MG tablet [Pharmacy Med Name: valACYclovir HCl 500 MG Oral Tablet] 30 tablet 0    Sig: Take 1 tablet by mouth once daily     Antimicrobials:  Antiviral Agents - Anti-Herpetic Failed - 01/19/2023  2:15 PM      Failed - Valid encounter within last 12 months    Recent Outpatient Visits           2 years ago Diarrhea, unspecified type   [facility] Donita Brooks, MD   2 years ago Diarrhea, unspecified type   [facility] Medicine Valentino Nose, NP   2 years ago Benign essential HTN   [facility] Family Medicine Tanya Nones, Priscille Heidelberg, MD   2 years ago Current moderate episode of major depressive disorder without prior episode (HCC)   [facility] Medicine Pickard, Priscille Heidelberg, MD   2 years ago Status migrainosus   [facility] Pickard, Priscille Heidelberg, MD               dicyclomine (BENTYL) 20 MG tablet [Pharmacy Med Name: Dicyclomine HCl 20 MG Oral Tablet] 90 tablet 0    Sig: TAKE 1 TABLET BY MOUTH EVERY 6 HOURS AS NEEDED     Gastroenterology:  Antispasmodic Agents Failed - 01/19/2023  2:15 PM      Failed - Valid encounter within last 12 months    Recent Outpatient Visits           2 years ago Diarrhea, unspecified type   [facility] Medicine Donita Brooks, MD   2 years ago Diarrhea, unspecified type   [facility] Medicine Valentino Nose, NP   2 years ago Benign essential HTN   [facility] Medicine Pickard, Priscille Heidelberg, MD   2 years ago Current moderate episode of major depressive disorder without prior episode (HCC)   [facility] Medicine Pickard, Priscille Heidelberg, MD   2 years ago Status  migrainosus   [facility] Medicine Pickard, Priscille Heidelberg, MD               topiramate (TOPAMAX) 25 MG tablet [Pharmacy Med Name: Topiramate 25 MG Oral Tablet] 180 tablet 0    Sig: Take 1 tablet by mouth twice daily     Neurology: Anticonvulsants - topiramate & zonisamide Failed - 01/19/2023  2:15 PM      Failed - Cr in normal range and within 360 days    Creat  Date Value Ref Range Status  01/29/2022 0.80 0.50 - 1.05 mg/dL Final   Creatinine, Ser  Date Value Ref Range Status  05/29/2022 1.31 (H) 0.57 - 1.00 mg/dL Final         Failed - ALT in normal range and within 360 days    ALT  Date Value Ref Range Status  05/29/2022 44 (H) 0 - 32 IU/L Final         Failed - AST in normal range and within 360 days    AST  Date Value Ref Range Status  05/29/2022 84 (H) 0 - 40 IU/L Final         Failed - Completed PHQ-2 or  PHQ-9 in the last 360 days      Failed - Valid encounter within last 12 months    Recent Outpatient Visits           2 years ago Diarrhea, unspecified type   [facility] Medicine Tanya Nones, Priscille Heidelberg, MD   2 years ago Diarrhea, unspecified type   [facility] Medicine Valentino Nose, NP   2 years ago Benign essential HTN   [facility] Medicine Donita Brooks, MD   2 years ago Current moderate episode of major depressive disorder without prior episode (HCC)   [facility] Medicine Pickard, Priscille Heidelberg, MD   2 years ago Status migrainosus   [facility] Medicine Pickard, Priscille Heidelberg, MD              Passed - CO2 in normal range and within 360 days    CO2  Date Value Ref Range Status  05/29/2022 25 20 - 29 mmol/L Final

## 2023-01-25 ENCOUNTER — Other Ambulatory Visit: Payer: Self-pay

## 2023-01-25 DIAGNOSIS — G43709 Chronic migraine without aura, not intractable, without status migrainosus: Secondary | ICD-10-CM

## 2023-01-25 DIAGNOSIS — L989 Disorder of the skin and subcutaneous tissue, unspecified: Secondary | ICD-10-CM

## 2023-01-25 NOTE — Telephone Encounter (Signed)
Prescription Request  01/25/2023  LOV: 04/23/22  What is the name of the medication or equipment? dicyclomine (BENTYL) 20 MG tablet [[id number]]   Have you contacted your pharmacy to request a refill? Yes   Which pharmacy would you like this sent to?  [id number] - [place] ([place]), [place] - [address] [place]) [place] [address] Phone: [phone] Fax: [phone]    Patient notified that their request is being sent to the clinical staff for review and that they should receive a response within 2 business days.   Please advise at [facility] [phone]

## 2023-01-25 NOTE — Telephone Encounter (Signed)
Prescription Request  01/25/2023  LOV: 04/23/22  What is the name of the medication or equipment? valACYclovir (VALTREX) 500 MG tablet [[id number]]   Have you contacted your pharmacy to request a refill? Yes   Which pharmacy would you like this sent to?  [facility] - [place] ([place]), [place] - [address] [place] ([place]) [place] [address] Phone: [phone] Fax: [phone]    Patient notified that their request is being sent to the clinical staff for review and that they should receive a response within 2 business days.   Please advise at [facility] [phone]

## 2023-01-26 MED ORDER — TOPIRAMATE 25 MG PO TABS: 25.0000 mg | ORAL_TABLET | Freq: Two times a day (BID) | ORAL | 0 refills | Status: DC

## 2023-01-26 MED ORDER — VALACYCLOVIR HCL 500 MG PO TABS: 500.0000 mg | ORAL_TABLET | Freq: Every day | ORAL | 0 refills | Status: DC

## 2023-01-26 NOTE — Telephone Encounter (Signed)
Prescription Request  01/26/2023  LOV: 04/23/22  What is the name of the medication or equipment? topiramate (TOPAMAX) 25 MG tablet [[id number]]   Have you contacted your pharmacy to request a refill? Yes   Which pharmacy would you like this sent to?  [facility] - [place] ([place]), [place] - [address] [place] ([place]) [place] [address] Phone: [phone] Fax: [phone]    Patient notified that their request is being sent to the clinical staff for review and that they should receive a response within 2 business days.   Please advise at [facility] [phone]

## 2023-01-26 NOTE — Telephone Encounter (Signed)
OV 04/23/22 Requested Prescriptions  Pending Prescriptions Disp Refills   valACYclovir (VALTREX) 500 MG tablet 90 tablet 3    Sig: Take 1 tablet (500 mg total) by mouth daily.     Antimicrobials:  Antiviral Agents - Anti-Herpetic Failed - 01/26/2023  1:32 PM      Failed - Valid encounter within last 12 months    Recent Outpatient Visits           2 years ago Diarrhea, unspecified type   [facility] Medicine Tanya Nones, Priscille Heidelberg, MD   2 years ago Diarrhea, unspecified type   [facility] Medicine Valentino Nose, NP   2 years ago Benign essential HTN   [facility] Donita Brooks, MD   2 years ago Current moderate episode of major depressive disorder without prior episode (HCC)   [facility] Medicine Donita Brooks, MD   2 years ago Status migrainosus   [facility] Tanya Nones, Priscille Heidelberg, MD               topiramate (TOPAMAX) 25 MG tablet 180 tablet 0    Sig: Take 1 tablet (25 mg total) by mouth 2 (two) times daily.     There is no refill protocol information for this order

## 2023-02-02 ENCOUNTER — Other Ambulatory Visit: Payer: Self-pay | Admitting: Family Medicine

## 2023-02-02 DIAGNOSIS — I10 Essential (primary) hypertension: Secondary | ICD-10-CM

## 2023-02-02 NOTE — Telephone Encounter (Signed)
 Prescription Request  02/02/2023  LOV: 04/23/2022  What is the name of the medication or equipment? dicyclomine  (BENTYL ) 20 MG tablet   Have you contacted your pharmacy to request a refill? Yes   Which pharmacy would you like this sent to?  [facility] - [place] - [address] [place] ([place]) [place] [address] Phone: [phone] Fax: [phone]    Patient notified that their request is being sent to the clinical staff for review and that they should receive a response within 2 business days.   Please advise at [facility] [phone]

## 2023-02-06 MED ORDER — DICYCLOMINE HCL 20 MG PO TABS: 20.0000 mg | ORAL_TABLET | Freq: Four times a day (QID) | ORAL | 0 refills | Status: DC | PRN

## 2023-02-06 NOTE — Telephone Encounter (Signed)
 Requested Prescriptions  Pending Prescriptions Disp Refills   dicyclomine  (BENTYL ) 20 MG tablet 90 tablet 0    Sig: Take 1 tablet (20 mg total) by mouth every 6 (six) hours as needed. TAKE 1 TABLET BY MOUTH EVERY 6 HOURS AS NEEDED. (OFFICE VISIT NEEDED FOR ADDITIONAL REFILLS, CALL OFFICE TO SCHEDULE)     Gastroenterology:  Antispasmodic Agents Failed - 02/06/2023  9:55 AM      Failed - Valid encounter within last 12 months    Recent Outpatient Visits           2 years ago Diarrhea, unspecified type   [facility] Medicine Duanne Butler DASEN, MD   2 years ago Diarrhea, unspecified type   [facility] Medicine Chandra Harlene LABOR, NP   2 years ago Benign essential HTN   [facility] Medicine Duanne Butler DASEN, MD   2 years ago Current moderate episode of major depressive disorder without prior episode (HCC)   [facility] Pickard, Butler DASEN, MD   2 years ago Status migrainosus   [facility] Family Medicine Pickard, Butler DASEN, MD

## 2023-02-16 ENCOUNTER — Other Ambulatory Visit: Payer: Self-pay | Admitting: Family Medicine

## 2023-02-16 DIAGNOSIS — E7800 Pure hypercholesterolemia, unspecified: Secondary | ICD-10-CM

## 2023-02-17 NOTE — Telephone Encounter (Signed)
 Requested medication (s) are due for refill today: yes  Requested medication (s) are on the active medication list: yes  Last refill:  01/26/23  Future visit scheduled: no  Notes to clinic:  Unable to refill per protocol, courtesy refill already given, routing for provider approval.      Requested Prescriptions  Pending Prescriptions Disp Refills   rosuvastatin  (CRESTOR ) 10 MG tablet [Pharmacy Med Name: ROSUVASTATIN  10MG  TAB] 30 tablet 0    Sig: Take 1 tablet by mouth once daily     Cardiovascular:  Antilipid - Statins 2 Failed - 02/17/2023  9:23 AM      Failed - Cr in normal range and within 360 days    Creat  Date Value Ref Range Status  01/29/2022 0.80 0.50 - 1.05 mg/dL Final   Creatinine, Ser  Date Value Ref Range Status  05/29/2022 1.31 (H) 0.57 - 1.00 mg/dL Final         Failed - Valid encounter within last 12 months    Recent Outpatient Visits           2 years ago Diarrhea, unspecified type   [facility] Medicine Austine Lefort, MD   2 years ago Diarrhea, unspecified type   [facility] Medicine Wilhemena Harbour, NP   2 years ago Benign essential HTN   [facility] Family Medicine Austine Lefort, MD   2 years ago Current moderate episode of major depressive disorder without prior episode (HCC)   [facility] Pickard, Cisco Crest, MD   2 years ago Status migrainosus   [facility] Medicine Pickard, Cisco Crest, MD              Failed - Lipid Panel in normal range within the last 12 months    Cholesterol  Date Value Ref Range Status  11/10/2021 284 (H) <200 mg/dL Final   LDL Cholesterol (Calc)  Date Value Ref Range Status  11/10/2021 162 (H) mg/dL (calc) Final    Comment:    Reference range: <100 . Desirable range <100 mg/dL for primary prevention;   <70 mg/dL for patients with CHD or diabetic patients  with > or = 2 CHD risk factors. Aaron Aas LDL-C is now calculated using the Martin-Hopkins  calculation, which is  a validated novel method providing  better accuracy than the Friedewald equation in the  estimation of LDL-C.  Melinda Sprawls et al. Erroll Heard. 0454;098(11): 2061-2068  (http://education.[employer].com/faq/FAQ164)    HDL  Date Value Ref Range Status  11/10/2021 74 > OR = 50 mg/dL Final   Triglycerides  Date Value Ref Range Status  11/10/2021 309 (H) <150 mg/dL Final    Comment:    . If a non-fasting specimen was collected, consider repeat triglyceride testing on a fasting specimen if clinically indicated.  Imagene Mam et al. J. of Clin. Lipidol. 2015;9:129-169. Aaron Aas          Passed - Patient is not pregnant

## 2023-02-25 ENCOUNTER — Telehealth: Payer: Self-pay | Admitting: *Deleted

## 2023-02-25 NOTE — Telephone Encounter (Signed)
I called pt and did not LM, trying to see if she wanted a 1330 appt with MM/NP.

## 2023-03-01 NOTE — Telephone Encounter (Signed)
Pharmacy sent efax to follow up on refill requested for rosuvastatin (CRESTOR) 10 MG tablet   LOV: 04/23/2022  Pharmacy:  [facility] [address] ([place]), [place] - [address] [place] ([place]) [place] [address] Phone: [phone]  Fax: [phone] DEA #: --   Please advise pharmacist.

## 2023-03-04 IMAGING — DX DG CHEST 2V
2 series · 2 of 2 positions shown · non-contrast
Comparison: 06/05/2013

CLINICAL DATA: Cough

EXAM:
CHEST - 2 VIEW

[chest pa]
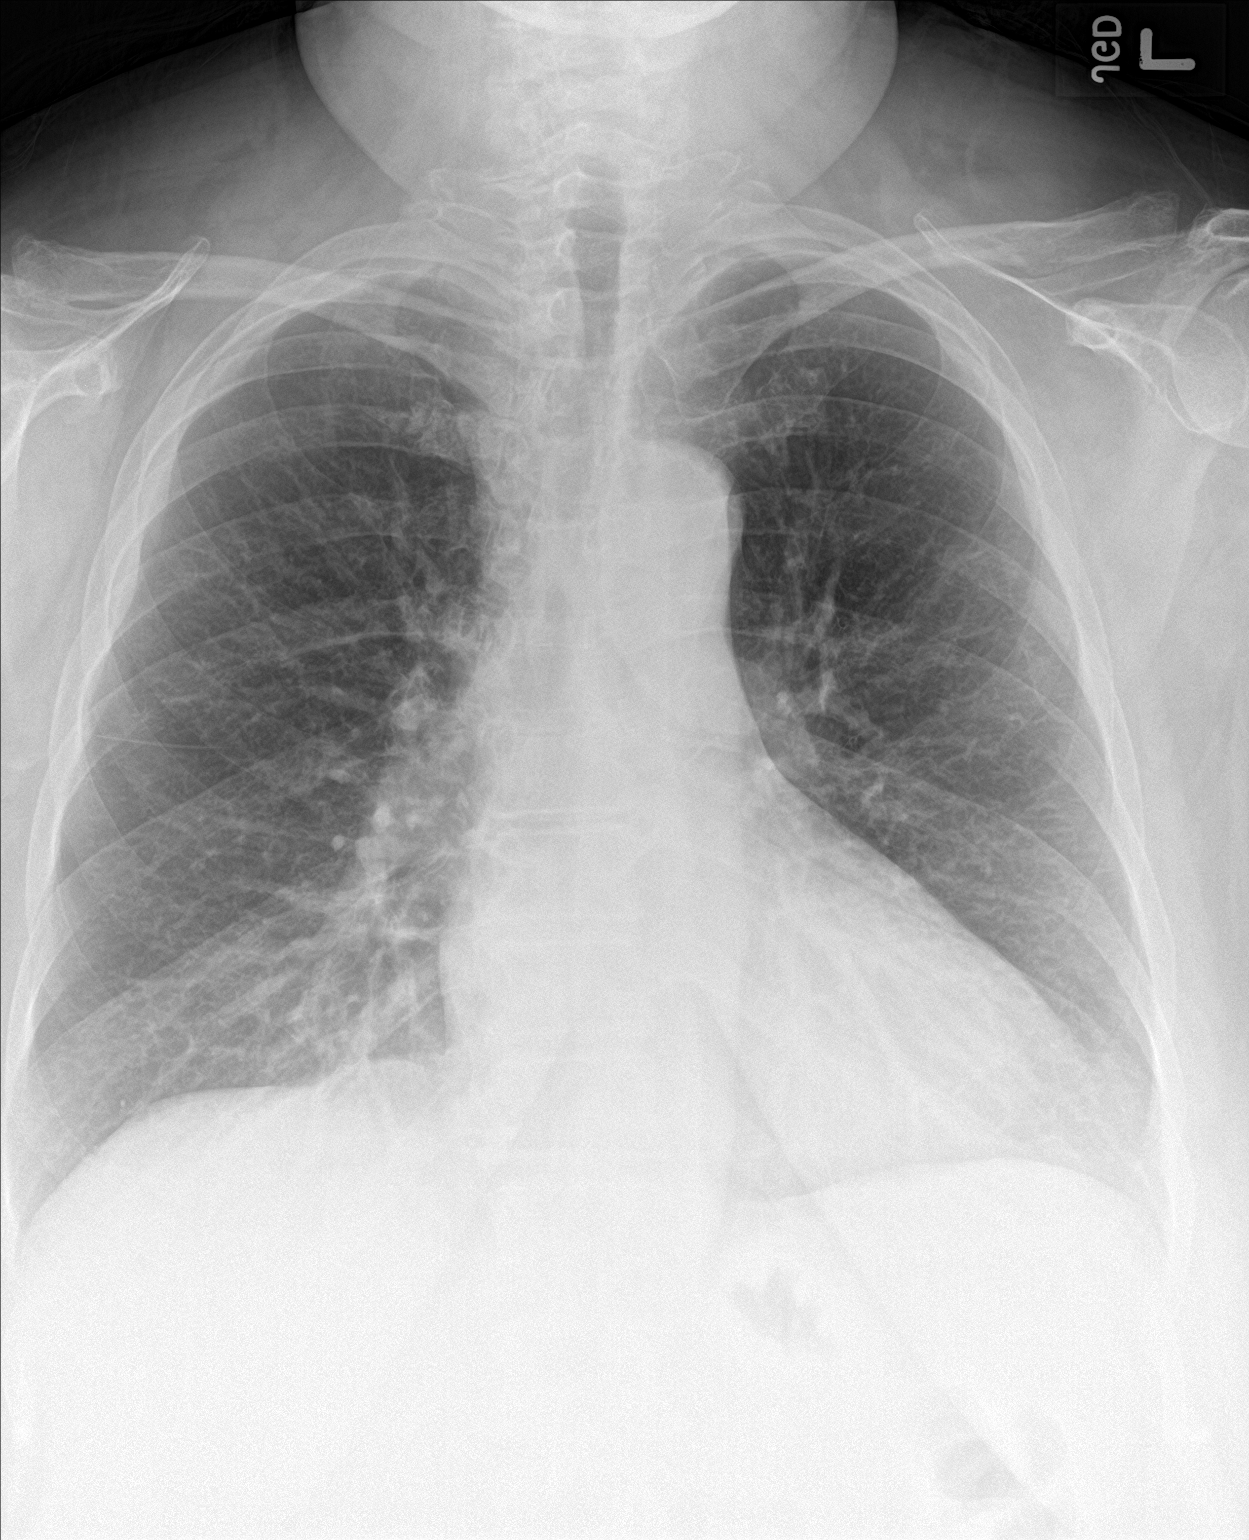

[chest lat]
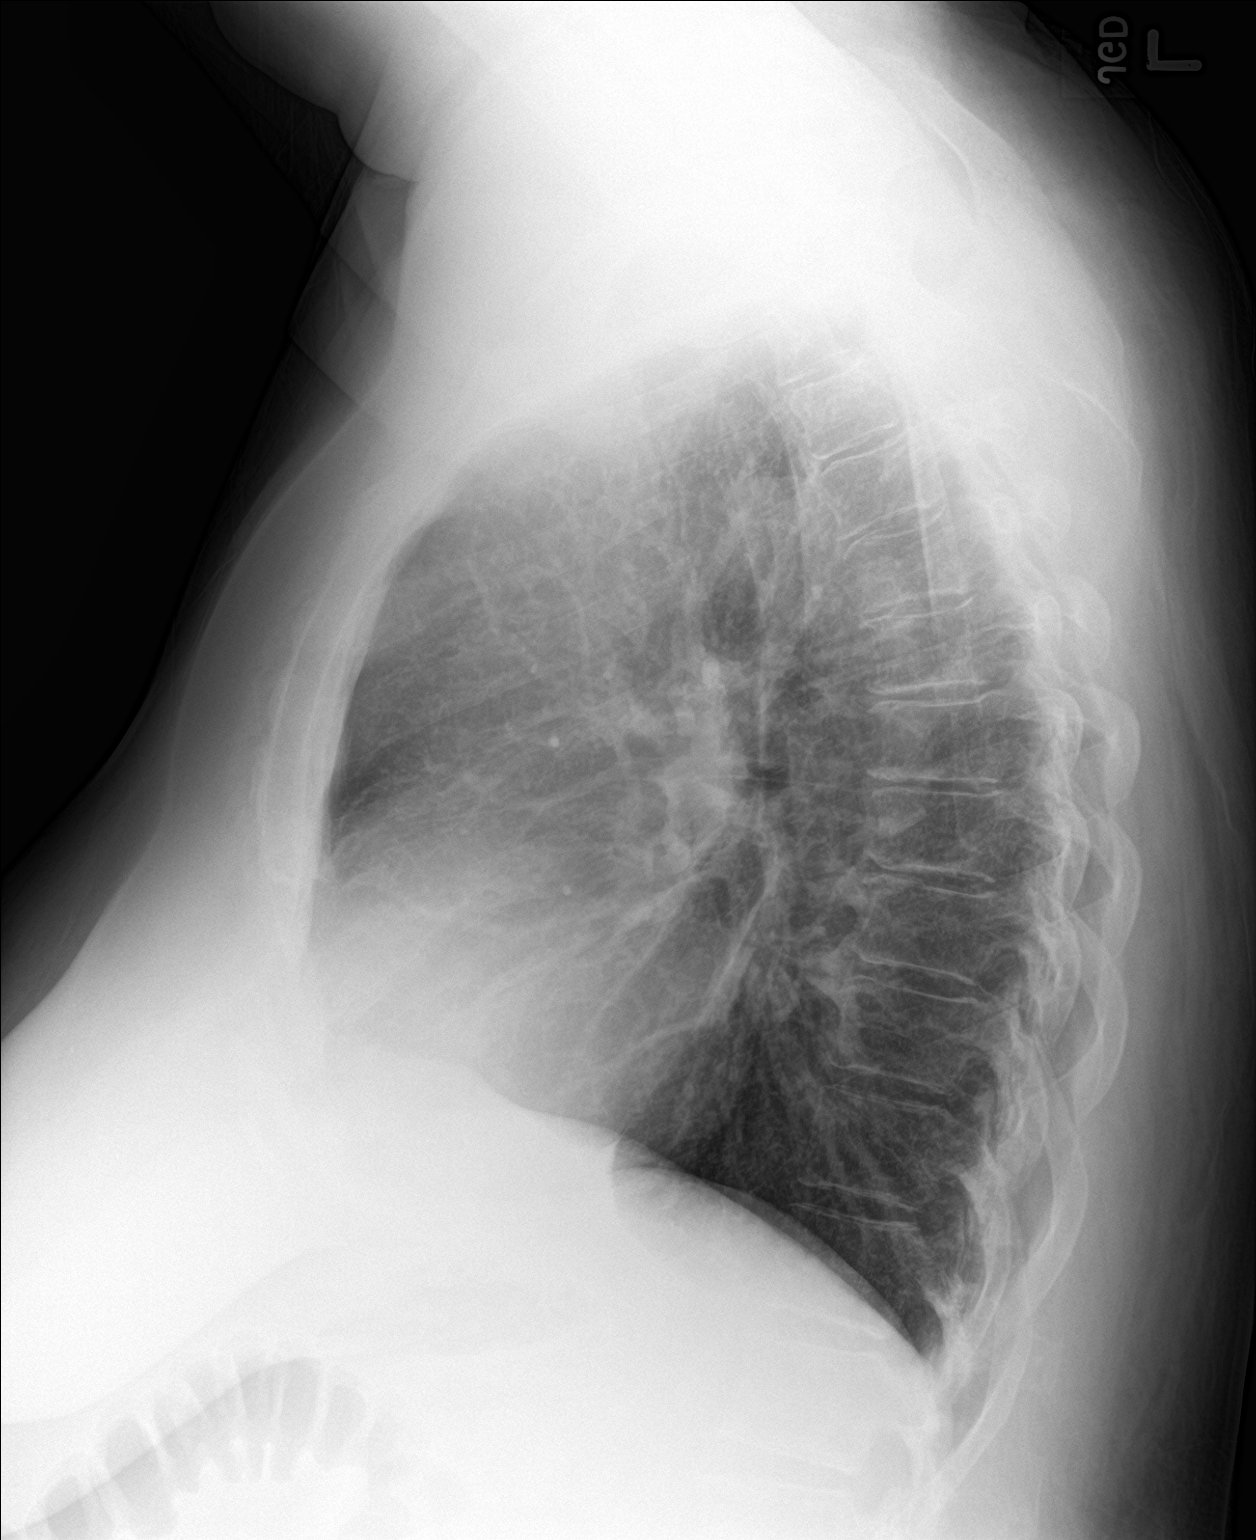

[2 of 2 positions shown; findings below may reference images not displayed]

FINDINGS: The heart size and mediastinal contours are within normal limits. No
focal airspace consolidation, pleural effusion, or pneumothorax. The
visualized skeletal structures are unremarkable.
IMPRESSION: No active cardiopulmonary disease.

## 2023-03-09 ENCOUNTER — Telehealth: Payer: Self-pay | Admitting: *Deleted

## 2023-03-09 NOTE — Telephone Encounter (Signed)
I LMVM for pt as well about appt (sooner).  Have not heard back.

## 2023-05-03 ENCOUNTER — Other Ambulatory Visit: Payer: Self-pay | Admitting: Family Medicine

## 2023-05-03 DIAGNOSIS — R062 Wheezing: Secondary | ICD-10-CM

## 2023-05-03 DIAGNOSIS — R0600 Dyspnea, unspecified: Secondary | ICD-10-CM

## 2023-05-04 NOTE — Telephone Encounter (Signed)
 Requested medication (s) are due for refill today - no  Requested medication (s) are on the active medication list -no  Future visit scheduled -no  Last refill: unknown  Notes to clinic: off protocol- provider review - medication no longer listed on current medication list   Requested Prescriptions  Pending Prescriptions Disp Refills   BREZTRI AEROSPHERE 160-9-4.8 MCG/ACT AERO [Pharmacy Med Name: BREZTRI 160-9-4.8 MCG/ACT  AER] 11 g 0    Sig: INHALE 2 PUFFS INTO LUNGS TWICE DAILY     Off-Protocol Failed - 05/04/2023 12:37 PM      Failed - Medication not assigned to a protocol, review manually.      Failed - Valid encounter within last 12 months    Recent Outpatient Visits           1 year ago Chronic migraine without aura without status migrainosus, not intractable   [place] [facility] Family Medicine Pickard, Priscille Heidelberg, MD   1 year ago New-onset angina [facility])   [place] [facility] Medicine Donita Brooks, MD   1 year ago Chronic migraine without aura without status migrainosus, not intractable   [place] [facility] Medicine Tanya Nones, Priscille Heidelberg, MD   1 year ago Benign essential HTN   [place] [facility] Medicine Pickard, Priscille Heidelberg, MD                 Requested Prescriptions  Pending Prescriptions Disp Refills   BREZTRI AEROSPHERE 160-9-4.8 MCG/ACT AERO [Pharmacy Med Name: BREZTRI 160-9-4.8 MCG/ACT  AER] 11 g 0    Sig: INHALE 2 PUFFS INTO LUNGS TWICE DAILY     Off-Protocol Failed - 05/04/2023 12:37 PM      Failed - Medication not assigned to a protocol, review manually.      Failed - Valid encounter within last 12 months    Recent Outpatient Visits           1 year ago Chronic migraine without aura without status migrainosus, not intractable   [place] [facility] Medicine Pickard, Priscille Heidelberg, MD   1 year ago New-onset angina [facility])   [place] [facility] Family Medicine Donita Brooks, MD   1 year ago Chronic  migraine without aura without status migrainosus, not intractable   [place] [facility] Family Medicine Pickard, Priscille Heidelberg, MD   1 year ago Benign essential HTN   [place] [facility] Family Medicine Pickard, Priscille Heidelberg, MD

## 2023-05-20 ENCOUNTER — Other Ambulatory Visit: Payer: Self-pay | Admitting: Family Medicine

## 2023-05-20 DIAGNOSIS — K588 Other irritable bowel syndrome: Secondary | ICD-10-CM

## 2023-05-20 DIAGNOSIS — I10 Essential (primary) hypertension: Secondary | ICD-10-CM

## 2023-05-21 ENCOUNTER — Ambulatory Visit: Admitting: Family Medicine

## 2023-05-21 NOTE — Telephone Encounter (Signed)
 Requested medication (s) are due for refill today - yes  Requested medication (s) are on the active medication list -yes/no  Future visit scheduled -yes  Last refill: Valsartan  01/19/23 #30- overdue appointment                  Clonazepam  10/30/22 #90- non delegated Rx                  Hyoscyamine - no longer on current medication list   Notes to clinic: see above, last OV 04/23/22, scheduled 05/24/23  Requested Prescriptions  Pending Prescriptions Disp Refills   valsartan  (DIOVAN ) 160 MG tablet [Pharmacy Med Name: Valsartan  160 MG Oral Tablet] 30 tablet 0    Sig: Take 1 tablet by mouth once daily     Cardiovascular:  Angiotensin Receptor Blockers Failed - 05/21/2023 10:28 AM      Failed - Cr in normal range and within 180 days    Creat  Date Value Ref Range Status  01/29/2022 0.80 0.50 - 1.05 mg/dL Final   Creatinine, Ser  Date Value Ref Range Status  05/29/2022 1.31 (H) 0.57 - 1.00 mg/dL Final         Failed - K in normal range and within 180 days    Potassium  Date Value Ref Range Status  05/29/2022 4.3 3.5 - 5.2 mmol/L Final         Failed - Valid encounter within last 6 months    Recent Outpatient Visits           1 year ago Chronic migraine without aura without status migrainosus, not intractable   [place] [facility] Medicine Duanne, Butler DASEN, MD   1 year ago New-onset angina [facility])   [place] [facility] Medicine Duanne Butler DASEN, MD   1 year ago Chronic migraine without aura without status migrainosus, not intractable   [place] [facility] Family Medicine Pickard, Butler DASEN, MD   1 year ago Benign essential HTN   [place] [facility] Medicine Pickard, Butler DASEN, MD              Passed - Patient is not pregnant      Passed - Last BP in normal range    BP Readings from Last 1 Encounters:  05/29/22 115/79          clonazePAM  (KLONOPIN ) 0.5 MG tablet [Pharmacy Med Name: clonazePAM  0.5 MG Oral Tablet] 90 tablet 0     Sig: TAKE 1 TABLET BY MOUTH THREE TIMES DAILY AS NEEDED FOR ANXIETY     Not Delegated - Psychiatry: Anxiolytics/Hypnotics 2 Failed - 05/21/2023 10:28 AM      Failed - This refill cannot be delegated      Failed - Urine Drug Screen completed in last 360 days      Failed - Valid encounter within last 6 months    Recent Outpatient Visits           1 year ago Chronic migraine without aura without status migrainosus, not intractable   [place] [facility] Duanne Butler DASEN, MD   1 year ago New-onset angina [facility])   [place] [facility] Family Medicine Duanne Butler DASEN, MD   1 year ago Chronic migraine without aura without status migrainosus, not intractable   [place] [facility] Family Medicine Pickard, Butler DASEN, MD   1 year ago Benign essential HTN    [facility] Medicine Pickard, Butler DASEN, MD  Passed - Patient is not pregnant       hyoscyamine  (LEVBID ) 0.375 MG 12 hr tablet [Pharmacy Med Name: Hyoscyamine  Sulfate ER 0.375 MG Oral Tablet Extended Release 12 Hour] 60 tablet 0    Sig: Take 1 tablet by mouth twice daily     Gastroenterology:  Antispasmodic Agents Failed - 05/21/2023 10:28 AM      Failed - Valid encounter within last 12 months    Recent Outpatient Visits           1 year ago Chronic migraine without aura without status migrainosus, not intractable   [place] [facility] Family Medicine Pickard, Butler DASEN, MD   1 year ago New-onset angina [facility])   [place] [facility] Family Medicine Duanne Butler DASEN, MD   1 year ago Chronic migraine without aura without status migrainosus, not intractable   [place] [facility] Family Medicine Duanne Butler DASEN, MD   1 year ago Benign essential HTN   [place] [facility] Family Medicine Pickard, Butler DASEN, MD                 Requested Prescriptions  Pending Prescriptions Disp Refills   valsartan  (DIOVAN ) 160 MG tablet [Pharmacy Med Name:  Valsartan  160 MG Oral Tablet] 30 tablet 0    Sig: Take 1 tablet by mouth once daily     Cardiovascular:  Angiotensin Receptor Blockers Failed - 05/21/2023 10:28 AM      Failed - Cr in normal range and within 180 days    Creat  Date Value Ref Range Status  01/29/2022 0.80 0.50 - 1.05 mg/dL Final   Creatinine, Ser  Date Value Ref Range Status  05/29/2022 1.31 (H) 0.57 - 1.00 mg/dL Final         Failed - K in normal range and within 180 days    Potassium  Date Value Ref Range Status  05/29/2022 4.3 3.5 - 5.2 mmol/L Final         Failed - Valid encounter within last 6 months    Recent Outpatient Visits           1 year ago Chronic migraine without aura without status migrainosus, not intractable   [place] [facility] Family Medicine Duanne, Butler DASEN, MD   1 year ago New-onset angina [facility])   [place] [facility] Medicine Duanne Butler DASEN, MD   1 year ago Chronic migraine without aura without status migrainosus, not intractable   [place] [facility] Family Medicine Pickard, Butler DASEN, MD   1 year ago Benign essential HTN    [facility] Medicine Pickard, Butler DASEN, MD              Passed - Patient is not pregnant      Passed - Last BP in normal range    BP Readings from Last 1 Encounters:  05/29/22 115/79          clonazePAM  (KLONOPIN ) 0.5 MG tablet [Pharmacy Med Name: clonazePAM  0.5 MG Oral Tablet] 90 tablet 0    Sig: TAKE 1 TABLET BY MOUTH THREE TIMES DAILY AS NEEDED FOR ANXIETY     Not Delegated - Psychiatry: Anxiolytics/Hypnotics 2 Failed - 05/21/2023 10:28 AM      Failed - This refill cannot be delegated      Failed - Urine Drug Screen completed in last 360 days      Failed - Valid encounter within last 6 months    Recent Outpatient  Visits           1 year ago Chronic migraine without aura without status migrainosus, not intractable   [place] [facility] Family Medicine Pickard, Butler DASEN, MD   1 year ago New-onset  angina [facility])   [place] [facility] Family Medicine Duanne Butler DASEN, MD   1 year ago Chronic migraine without aura without status migrainosus, not intractable   [place] [facility] Family Medicine Duanne, Butler DASEN, MD   1 year ago Benign essential HTN   [place] [facility] Family Medicine Pickard, Butler DASEN, MD              Passed - Patient is not pregnant       hyoscyamine  (LEVBID ) 0.375 MG 12 hr tablet [Pharmacy Med Name: Hyoscyamine  Sulfate ER 0.375 MG Oral Tablet Extended Release 12 Hour] 60 tablet 0    Sig: Take 1 tablet by mouth twice daily     Gastroenterology:  Antispasmodic Agents Failed - 05/21/2023 10:28 AM      Failed - Valid encounter within last 12 months    Recent Outpatient Visits           1 year ago Chronic migraine without aura without status migrainosus, not intractable   [place] [facility] Medicine Duanne, Butler DASEN, MD   1 year ago New-onset angina [facility])   [place] [facility] Medicine Duanne Butler DASEN, MD   1 year ago Chronic migraine without aura without status migrainosus, not intractable   [place] [facility] Medicine Pickard, Butler DASEN, MD   1 year ago Benign essential HTN   [place] [facility] Family Medicine Pickard, Butler DASEN, MD

## 2023-05-24 ENCOUNTER — Other Ambulatory Visit: Payer: Self-pay | Admitting: Family Medicine

## 2023-05-24 ENCOUNTER — Ambulatory Visit: Admitting: Family Medicine

## 2023-05-25 NOTE — Telephone Encounter (Signed)
 Requested medication (s) are due for refill today: Yes  Requested medication (s) are on the active medication list: No  Last refill:  05/26/22  Future visit scheduled: No  Notes to clinic:  Rx not on current medication list. Listed as discontinued on 05/29/22 by Aldona Amel, MD     Requested Prescriptions  Pending Prescriptions Disp Refills   SUMAtriptan  (IMITREX ) 100 MG tablet [Pharmacy Med Name: SUMAtriptan  Succinate 100 MG Oral Tablet] 10 tablet 0    Sig: TAKE 1 TABLET BY MOUTH EVERY 2 HOURS AS NEEDED FOR MIGRAINE.MAY REPEAT IN 2 HOURS IF HEADACHE PERSISTS OR RECURS.     Neurology:  Migraine Therapy - Triptan Failed - 05/25/2023  2:24 PM      Failed - Valid encounter within last 12 months    Recent Outpatient Visits           1 year ago Chronic migraine without aura without status migrainosus, not intractable   [place] [facility] Medicine Cheril Cork, Cisco Crest, MD   1 year ago New-onset angina [facility])   [place] [facility] Medicine Austine Lefort, MD   1 year ago Chronic migraine without aura without status migrainosus, not intractable   [place] [facility] Medicine Cheril Cork, Cisco Crest, MD   1 year ago Benign essential HTN    [facility] Austine Lefort, MD              Passed - Last BP in normal range    BP Readings from Last 1 Encounters:  05/29/22 115/79

## 2023-05-28 ENCOUNTER — Other Ambulatory Visit: Payer: Self-pay | Admitting: Family Medicine

## 2023-05-28 NOTE — Telephone Encounter (Signed)
 Requested Prescriptions  Refused Prescriptions Disp Refills   SUMAtriptan  (IMITREX ) 100 MG tablet [Pharmacy Med Name: SUMAtriptan  Succinate 100 MG Oral Tablet] 10 tablet 0    Sig: TAKE 1 TABLET BY MOUTH EVERY 2 HOURS AS NEEDED FOR MIGRAINE.MAY REPEAT IN 2 HOURS IF HEADACHE PERSISTS OR RECURS.     Neurology:  Migraine Therapy - Triptan Failed - 05/28/2023  4:11 PM      Failed - Valid encounter within last 12 months    Recent Outpatient Visits           1 year ago Chronic migraine without aura without status migrainosus, not intractable   [place] [facility] Medicine Cheril Cork, Cisco Crest, MD   1 year ago New-onset angina [facility])   [place] [facility] Family Medicine Austine Lefort, MD   1 year ago Chronic migraine without aura without status migrainosus, not intractable   [place] [facility] Family Medicine Cheril Cork, Cisco Crest, MD   1 year ago Benign essential HTN   [place] [facility] Family Medicine Austine Lefort, MD              Passed - Last BP in normal range    BP Readings from Last 1 Encounters:  05/29/22 115/79

## 2023-06-01 ENCOUNTER — Encounter: Payer: Self-pay | Admitting: Family Medicine

## 2023-06-01 ENCOUNTER — Ambulatory Visit (INDEPENDENT_AMBULATORY_CARE_PROVIDER_SITE_OTHER): Admitting: Family Medicine

## 2023-06-01 VITALS — BP 126/84 | HR 103 | Temp 98.1°F | Ht 65.0 in | Wt 226.0 lb

## 2023-06-01 DIAGNOSIS — G8929 Other chronic pain: Secondary | ICD-10-CM

## 2023-06-01 DIAGNOSIS — M5441 Lumbago with sciatica, right side: Secondary | ICD-10-CM | POA: Diagnosis not present

## 2023-06-01 DIAGNOSIS — E7800 Pure hypercholesterolemia, unspecified: Secondary | ICD-10-CM

## 2023-06-01 DIAGNOSIS — I10 Essential (primary) hypertension: Secondary | ICD-10-CM

## 2023-06-01 DIAGNOSIS — M5416 Radiculopathy, lumbar region: Secondary | ICD-10-CM | POA: Diagnosis not present

## 2023-06-01 DIAGNOSIS — R29898 Other symptoms and signs involving the musculoskeletal system: Secondary | ICD-10-CM

## 2023-06-01 MED ORDER — GABAPENTIN 300 MG PO CAPS: 300.0000 mg | ORAL_CAPSULE | Freq: Three times a day (TID) | ORAL | 3 refills | Status: AC

## 2023-06-01 MED ORDER — CLOTRIMAZOLE-BETAMETHASONE 1-0.05 % EX CREA: 1.0000 | TOPICAL_CREAM | Freq: Every day | CUTANEOUS | 1 refills | Status: DC

## 2023-06-01 MED ORDER — BUDESONIDE-FORMOTEROL FUMARATE 160-4.5 MCG/ACT IN AERO: 2.0000 | INHALATION_SPRAY | Freq: Two times a day (BID) | RESPIRATORY_TRACT | 3 refills | Status: DC

## 2023-06-01 NOTE — Progress Notes (Addendum)
 Subjective:    Patient ID: Deborah Jennings, female    DOB: [birth year], 63 y.o.   MRN: [id number] Patient presents today complaining of pain radiating down her right leg.  She has a history of low back pain and previous back surgery more than 15 years ago.  She was told that she had nerve damage by podiatrist who performed nerve conduction studies in her right leg.  She has not seen the neurosurgeon in quite some time.  She states that she has constant unrelenting pain in her right lower back radiating into her right gluteus down her right leg.  I have not seen the patient in over a year.  She is canceled numerous appointments.  She states that the pain has been there for more than a year and is severe and debilitating.  She is extremely tearful and distraught today on exam.  Recently I refused to refill her Klonopin  without an office visit.  Patient comes in today for follow-up.  She is requesting something for pain in her lower back.  Patient states she can barely stand.  She is unable to step up onto the exam table due to the perceived weakness in her left leg despite the pain being in her right leg.  She states that she is dragging her left leg.  She reports weakness with hip flexion and knee extension.  Today on exam, the patient has 5 out of 5 muscle strength in the right leg with regards to hip flexion knee extension and knee flexion and ankle plantar and dorsiflexion.  However in her left leg she has questionable effort.  She mounts very little resistance to my exam.  She states that she is unable to however I do not believe the patient is making full effort.  She has normal reflexes 2/4 equal and symmetric at the patella and the Achilles in both legs.  She has normal sensation.  She had an episode of urinary incontinence prior to arrival. Past Medical History:  Diagnosis Date   ASCUS of cervix with negative high risk HPV 04/2017   Asthma    Back pain    low back pain   Endometriosis    IBS  (irritable bowel syndrome)    Migraine    Past Surgical History:  Procedure Laterality Date   ABDOMINAL HYSTERECTOMY     RSO   CESAREAN SECTION     DILATION AND CURETTAGE OF UTERUS     ganglion cyst removal Right    foot   lower back surgery     "FUSION AND DISC REMOVED"   OOPHORECTOMY     RSO   PELVIC LAPAROSCOPY     Lysis of adhesions-Endometriosis   Current Outpatient Medications on File Prior to Visit  Medication Sig Dispense Refill   albuterol  (VENTOLIN  HFA) 108 (90 Base) MCG/ACT inhaler INHALE 2 PUFFS INTO THE LUNGS EVERY 6 HOURS AS NEEDED FOR WHEEZE OR SHORTNESS OF BREATH 8.5 g 11   clonazePAM  (KLONOPIN ) 0.5 MG tablet TAKE 1 TABLET BY MOUTH THREE TIMES DAILY AS NEEDED FOR ANXIETY 90 tablet 0   dicyclomine  (BENTYL ) 20 MG tablet Take 1 tablet (20 mg total) by mouth every 6 (six) hours as needed. TAKE 1 TABLET BY MOUTH EVERY 6 HOURS AS NEEDED. (OFFICE VISIT NEEDED FOR ADDITIONAL REFILLS, CALL OFFICE TO SCHEDULE) 90 tablet 0   escitalopram  (LEXAPRO ) 20 MG tablet Take 1 tablet by mouth once daily 30 tablet 0   estradiol  (VIVELLE -DOT) 0.075 MG/24HR Place 1 patch onto the  skin 2 (two) times a week. 24 patch 3   Fremanezumab -vfrm (AJOVY ) 225 MG/1.5ML SOAJ Inject 225 mg into the skin every 30 (thirty) days. 1.5 mL 11   furosemide  (LASIX ) 40 MG tablet Take 1 tablet by mouth twice daily 60 tablet 2   Multiple Vitamin (MULTIVITAMIN) tablet Take 1 tablet by mouth daily.     Potassium Chloride  ER 20 MEQ TBCR Take 1 tablet by mouth once daily 90 tablet 1   rosuvastatin  (CRESTOR ) 10 MG tablet Take 1 tablet by mouth once daily 90 tablet 0   valsartan  (DIOVAN ) 160 MG tablet Take 1 tablet by mouth once daily 30 tablet 0   No current facility-administered medications on file prior to visit.       Allergies  Allergen Reactions   Ampicillin Hives and Swelling    "PCN is fine"   Social History   Socioeconomic History   Marital status: Married    Spouse name: Not on file   Number of  children: Not on file   Years of education: Not on file   Highest education level: Not on file  Occupational History   Not on file  Tobacco Use   Smoking status: Former    Current packs/day: 0.00    Types: Cigarettes    Quit date: 1986    Years since quitting: 39.3   Smokeless tobacco: Never  Vaping Use   Vaping status: Never Used  Substance and Sexual Activity   Alcohol use: Yes    Alcohol/week: 5.0 standard drinks of alcohol    Types: 5 Standard drinks or equivalent per week   Drug use: Never   Sexual activity: Yes    Birth control/protection: Surgical    Comment: 1st intercourse 63 yo-More than 5 partners  Other Topics Concern   Not on file  Social History Narrative   Caffeine : 2 cups tea some days   Right handed   Lives at home with husband and their two Chihuahuas    Social Drivers of Corporate investment banker Strain: Not on file  Food Insecurity: Not on file  Transportation Needs: Not on file  Physical Activity: Not on file  Stress: Not on file  Social Connections: Not on file  Intimate Partner Violence: Not on file     Review of Systems  Musculoskeletal:  Positive for back pain.  Neurological:  Positive for weakness and headaches.  All other systems reviewed and are negative.      Objective:   Physical Exam Vitals reviewed.  Constitutional:      General: She is not in acute distress.    Appearance: Normal appearance. She is not ill-appearing or toxic-appearing.  HENT:     Head: Normocephalic and atraumatic.  Eyes:     Extraocular Movements: Extraocular movements intact.     Pupils: Pupils are equal, round, and reactive to light.  Cardiovascular:     Rate and Rhythm: Normal rate and regular rhythm.     Heart sounds: Normal heart sounds.  Pulmonary:     Effort: Pulmonary effort is normal.     Breath sounds: Wheezing present. No rales.  Neurological:     General: No focal deficit present.     Mental Status: She is alert and oriented to person,  place, and time. Mental status is at baseline.     Cranial Nerves: No cranial nerve deficit.     Sensory: No sensory deficit.     Motor: No weakness.     Coordination: Coordination normal.  Gait: Gait normal.  Psychiatric:        Attention and Perception: Attention normal.        Mood and Affect: Affect is tearful.        Behavior: Behavior normal.        Thought Content: Thought content normal.        Cognition and Memory: Cognition and memory normal.        Judgment: Judgment normal.           Assessment & Plan:  Benign essential HTN - Plan: CBC with Differential/Platelet, COMPLETE METABOLIC PANEL WITHOUT GFR, Lipid panel  Pure hypercholesterolemia - Plan: CBC with Differential/Platelet, COMPLETE METABOLIC PANEL WITHOUT GFR, Lipid panel  Right lumbar radiculopathy - Plan: MR Lumbar Spine Wo Contrast  Chronic right-sided low back pain with right-sided sciatica  Left leg weakness Patient had an MRI of the brain in October 2024 that showed no evidence of a stroke.  Therefore I do not believe that the left leg weakness  is due to stroke.  Neurologic exam today is abnormal but patient is unable to give good effort on the exam.  Begin by obtaining an MRI of the lumbar spine to evaluate for nerve impingement given her low back pain and the constant 1 year right-sided sciatica.  Declined to prescribe narcotics for pain.  My concern is that the patient reports unrelenting severe back pain and headaches for over a year.  However she has canceled more than 6 appointments and failed to keep other appointments during that time.  Therefore I will not prescribe narcotics or controlled substances for the back pain until I have objective findings on an MRI to treat.  I will give the patient gabapentin  300 mg 3 times a day as needed for nerve pain.  Patient request something for headaches.  She is no longer on Ubrelvy .  She is not taking anything for her headaches.  I gave the patient samples of  Nurtec 75 mg.  She can take 1 pill a day as needed for the headaches.  Again I want to avoid any controlled substances.  Blood pressure today is acceptable.  I will check a CBC a CMP and a lipid panel to monitor her cholesterol.  Patient is unable to afford Trelegy.  Therefore I will switch to generic Symbicort 160/4.52 puffs inhaled twice daily

## 2023-06-04 LAB — TEST AUTHORIZATION

## 2023-06-04 LAB — COMPLETE METABOLIC PANEL WITHOUT GFR
AG Ratio: 1.8 (calc) (ref 1.0–2.5)
ALT: 26 U/L (ref 6–29)
AST: 38 U/L — ABNORMAL HIGH (ref 10–35)
Albumin: 4.4 g/dL (ref 3.6–5.1)
Alkaline phosphatase (APISO): 81 U/L (ref 37–153)
BUN: 16 mg/dL (ref 7–25)
CO2: 30 mmol/L (ref 20–32)
Calcium: 9.9 mg/dL (ref 8.6–10.4)
Chloride: 98 mmol/L (ref 98–110)
Creat: 0.93 mg/dL (ref 0.50–1.05)
Globulin: 2.4 g/dL (ref 1.9–3.7)
Glucose, Bld: 151 mg/dL — ABNORMAL HIGH (ref 65–99)
Potassium: 3.4 mmol/L — ABNORMAL LOW (ref 3.5–5.3)
Sodium: 141 mmol/L (ref 135–146)
Total Bilirubin: 0.3 mg/dL (ref 0.2–1.2)
Total Protein: 6.8 g/dL (ref 6.1–8.1)

## 2023-06-04 LAB — CBC WITH DIFFERENTIAL/PLATELET
Absolute Lymphocytes: 2102 {cells}/uL (ref 850–3900)
Absolute Monocytes: 582 {cells}/uL (ref 200–950)
Basophils Absolute: 55 {cells}/uL (ref 0–200)
Basophils Relative: 0.6 %
Eosinophils Absolute: 109 {cells}/uL (ref 15–500)
Eosinophils Relative: 1.2 %
HCT: 42.4 % (ref 35.0–45.0)
Hemoglobin: 14.2 g/dL (ref 11.7–15.5)
MCH: 32.3 pg (ref 27.0–33.0)
MCHC: 33.5 g/dL (ref 32.0–36.0)
MCV: 96.4 fL (ref 80.0–100.0)
MPV: 12.5 fL (ref 7.5–12.5)
Monocytes Relative: 6.4 %
Neutro Abs: 6252 {cells}/uL (ref 1500–7800)
Neutrophils Relative %: 68.7 %
Platelets: 275 10*3/uL (ref 140–400)
RBC: 4.4 10*6/uL (ref 3.80–5.10)
RDW: 14.3 % (ref 11.0–15.0)
Total Lymphocyte: 23.1 %
WBC: 9.1 10*3/uL (ref 3.8–10.8)

## 2023-06-04 LAB — LIPID PANEL
Cholesterol: 211 mg/dL — ABNORMAL HIGH (ref <200)
HDL: 67 mg/dL (ref >=50)
LDL Cholesterol (Calc): 101 mg/dL — ABNORMAL HIGH
Non-HDL Cholesterol (Calc): 144 mg/dL — ABNORMAL HIGH (ref <130)
Total CHOL/HDL Ratio: 3.1 (calc) (ref <5.0)
Triglycerides: 323 mg/dL — ABNORMAL HIGH (ref <150)

## 2023-06-04 LAB — HEMOGLOBIN A1C
Hgb A1c MFr Bld: 6.3 % — ABNORMAL HIGH (ref <5.7)
Mean Plasma Glucose: 134 mg/dL
eAG (mmol/L): 7.4 mmol/L

## 2023-06-08 ENCOUNTER — Other Ambulatory Visit: Payer: Self-pay | Admitting: Family Medicine

## 2023-06-08 MED ORDER — FLUTICASONE-SALMETEROL 250-50 MCG/ACT IN AEPB: 1.0000 | INHALATION_SPRAY | Freq: Two times a day (BID) | RESPIRATORY_TRACT | 5 refills | Status: DC

## 2023-06-18 ENCOUNTER — Other Ambulatory Visit: Payer: Self-pay | Admitting: Family Medicine

## 2023-06-18 DIAGNOSIS — I10 Essential (primary) hypertension: Secondary | ICD-10-CM

## 2023-06-21 NOTE — Telephone Encounter (Signed)
 Requested Prescriptions  Pending Prescriptions Disp Refills   dicyclomine  (BENTYL ) 20 MG tablet [Pharmacy Med Name: Dicyclomine  HCl 20 MG Oral Tablet] 90 tablet 0    Sig: Take 1 tablet (20 mg total) by mouth every 6 (six) hours as needed for spasms.     Gastroenterology:  Antispasmodic Agents Failed - 06/21/2023  4:38 PM      Failed - Valid encounter within last 12 months    Recent Outpatient Visits           2 weeks ago Benign essential HTN   [facility] Austine Lefort, MD   1 year ago Chronic migraine without aura without status migrainosus, not intractable   [facility] Medicine Cheril Cork, Cisco Crest, MD   1 year ago New-onset angina [facility])   [facility] Austine Lefort, MD   1 year ago Chronic migraine without aura without status migrainosus, not intractable   [facility] Medicine Pickard, Cisco Crest, MD   1 year ago Benign essential HTN   [facility] Medicine Pickard, Cisco Crest, MD

## 2023-06-25 ENCOUNTER — Other Ambulatory Visit: Payer: Self-pay | Admitting: Family Medicine

## 2023-07-09 ENCOUNTER — Other Ambulatory Visit: Payer: Self-pay | Admitting: Neurology

## 2023-07-09 ENCOUNTER — Telehealth: Payer: Self-pay

## 2023-07-09 ENCOUNTER — Other Ambulatory Visit: Payer: Self-pay | Admitting: Family Medicine

## 2023-07-09 DIAGNOSIS — I10 Essential (primary) hypertension: Secondary | ICD-10-CM

## 2023-07-09 NOTE — Telephone Encounter (Signed)
 Hello!  This patient has contacted me stating that she has not received a call regarding scheduling her MRI that was ordered on 06/02/2023. Do I need to contact someone different for scheduling this? Thank you for your help! Laraine Plate

## 2023-07-12 NOTE — Telephone Encounter (Signed)
 Last OV 06/01/23, last RF 06/21/23 for 120 and 2 RF. Too soon for refill,E-Prescribing Status: Receipt confirmed by pharmacy (06/21/2023  4:39 PM EDT).  Requested Prescriptions  Pending Prescriptions Disp Refills   dicyclomine  (BENTYL ) 20 MG tablet [Pharmacy Med Name: Dicyclomine  HCl 20 MG Oral Tablet] 90 tablet 0    Sig: TAKE 1 TABLET BY MOUTH EVERY 6 HOURS AS NEEDED. (OFFICE VISIT NEEDED FOR ADDITIONAL REFILLS). CALL OFFICE TO SCHEDULE).     Gastroenterology:  Antispasmodic Agents Failed - 07/12/2023  8:22 AM      Failed - Valid encounter within last 12 months    Recent Outpatient Visits           1 month ago Benign essential HTN   [facility] Austine Lefort, MD   1 year ago Chronic migraine without aura without status migrainosus, not intractable   [facility] Cheril Cork, Cisco Crest, MD   1 year ago New-onset angina [facility])   [facility] Austine Lefort, MD   1 year ago Chronic migraine without aura without status migrainosus, not intractable   [facility] Pickard, Cisco Crest, MD   1 year ago Benign essential HTN   [facility] Pickard, Cisco Crest, MD

## 2023-07-22 NOTE — Telephone Encounter (Signed)
 Reviewed chart. Dr Cheril Cork d/c'd Rizatriptan  at 4/29 OV. His note said pt wasn't taking anything for headaches but she asked him for something. He gave her Nurtec samples. Pt is overdue for appt here. Refusing refill for now. She will need OV here to discuss treatment options.

## 2023-08-03 ENCOUNTER — Other Ambulatory Visit: Payer: Self-pay

## 2023-08-03 DIAGNOSIS — J209 Acute bronchitis, unspecified: Secondary | ICD-10-CM

## 2023-08-03 MED ORDER — ALBUTEROL SULFATE HFA 108 (90 BASE) MCG/ACT IN AERS: INHALATION_SPRAY | RESPIRATORY_TRACT | 11 refills | Status: DC

## 2023-08-20 ENCOUNTER — Inpatient Hospital Stay (HOSPITAL_COMMUNITY)
Admission: EM | Admit: 2023-08-20 | Discharge: 2023-08-25 | DRG: 057 | Disposition: A | Discharge status: Home / Self Care | Payer: PRIVATE HEALTH INSURANCE | Attending: Internal Medicine | Admitting: Internal Medicine

## 2023-08-20 ENCOUNTER — Other Ambulatory Visit: Payer: Self-pay

## 2023-08-20 ENCOUNTER — Emergency Department (HOSPITAL_COMMUNITY): Payer: PRIVATE HEALTH INSURANCE

## 2023-08-20 DIAGNOSIS — R609 Edema, unspecified: Secondary | ICD-10-CM | POA: Diagnosis present

## 2023-08-20 DIAGNOSIS — Z8249 Family history of ischemic heart disease and other diseases of the circulatory system: Secondary | ICD-10-CM

## 2023-08-20 DIAGNOSIS — J45909 Unspecified asthma, uncomplicated: Secondary | ICD-10-CM | POA: Diagnosis present

## 2023-08-20 DIAGNOSIS — R2242 Localized swelling, mass and lump, left lower limb: Secondary | ICD-10-CM

## 2023-08-20 DIAGNOSIS — E669 Obesity, unspecified: Secondary | ICD-10-CM | POA: Diagnosis present

## 2023-08-20 DIAGNOSIS — W010XXA Fall on same level from slipping, tripping and stumbling without subsequent striking against object, initial encounter: Secondary | ICD-10-CM | POA: Diagnosis present

## 2023-08-20 DIAGNOSIS — G8194 Hemiplegia, unspecified affecting left nondominant side: Principal | ICD-10-CM | POA: Diagnosis present

## 2023-08-20 DIAGNOSIS — Z7951 Long term (current) use of inhaled steroids: Secondary | ICD-10-CM

## 2023-08-20 DIAGNOSIS — Z880 Allergy status to penicillin: Secondary | ICD-10-CM

## 2023-08-20 DIAGNOSIS — M7989 Other specified soft tissue disorders: Principal | ICD-10-CM

## 2023-08-20 DIAGNOSIS — R296 Repeated falls: Secondary | ICD-10-CM | POA: Diagnosis present

## 2023-08-20 DIAGNOSIS — G894 Chronic pain syndrome: Secondary | ICD-10-CM | POA: Diagnosis present

## 2023-08-20 DIAGNOSIS — G43909 Migraine, unspecified, not intractable, without status migrainosus: Secondary | ICD-10-CM | POA: Diagnosis present

## 2023-08-20 DIAGNOSIS — M5416 Radiculopathy, lumbar region: Secondary | ICD-10-CM | POA: Diagnosis present

## 2023-08-20 DIAGNOSIS — K589 Irritable bowel syndrome without diarrhea: Secondary | ICD-10-CM | POA: Diagnosis present

## 2023-08-20 DIAGNOSIS — K219 Gastro-esophageal reflux disease without esophagitis: Secondary | ICD-10-CM | POA: Diagnosis present

## 2023-08-20 DIAGNOSIS — R2232 Localized swelling, mass and lump, left upper limb: Secondary | ICD-10-CM

## 2023-08-20 DIAGNOSIS — Z90710 Acquired absence of both cervix and uterus: Secondary | ICD-10-CM

## 2023-08-20 DIAGNOSIS — Z87891 Personal history of nicotine dependence: Secondary | ICD-10-CM

## 2023-08-20 DIAGNOSIS — E7800 Pure hypercholesterolemia, unspecified: Secondary | ICD-10-CM | POA: Diagnosis present

## 2023-08-20 DIAGNOSIS — Z833 Family history of diabetes mellitus: Secondary | ICD-10-CM

## 2023-08-20 DIAGNOSIS — L8990 Pressure ulcer of unspecified site, unspecified stage: Secondary | ICD-10-CM | POA: Insufficient documentation

## 2023-08-20 DIAGNOSIS — Z79899 Other long term (current) drug therapy: Secondary | ICD-10-CM

## 2023-08-20 DIAGNOSIS — E119 Type 2 diabetes mellitus without complications: Secondary | ICD-10-CM | POA: Diagnosis present

## 2023-08-20 DIAGNOSIS — Z6839 Body mass index (BMI) 39.0-39.9, adult: Secondary | ICD-10-CM

## 2023-08-20 DIAGNOSIS — L89321 Pressure ulcer of left buttock, stage 1: Secondary | ICD-10-CM | POA: Diagnosis present

## 2023-08-20 DIAGNOSIS — R531 Weakness: Secondary | ICD-10-CM

## 2023-08-20 DIAGNOSIS — F419 Anxiety disorder, unspecified: Secondary | ICD-10-CM | POA: Diagnosis present

## 2023-08-20 DIAGNOSIS — Z981 Arthrodesis status: Secondary | ICD-10-CM

## 2023-08-20 LAB — CBC WITH DIFFERENTIAL/PLATELET
Abs Immature Granulocytes: 0.13 K/uL — ABNORMAL HIGH (ref 0.00–0.07)
Basophils Absolute: 0 K/uL (ref 0.0–0.1)
Basophils Relative: 1 %
Eosinophils Absolute: 0.1 K/uL (ref 0.0–0.5)
Eosinophils Relative: 1 %
HCT: 44.9 % (ref 36.0–46.0)
Hemoglobin: 15.3 g/dL — ABNORMAL HIGH (ref 12.0–15.0)
Immature Granulocytes: 2 %
Lymphocytes Relative: 35 %
Lymphs Abs: 2.5 K/uL (ref 0.7–4.0)
MCH: 32.8 pg (ref 26.0–34.0)
MCHC: 34.1 g/dL (ref 30.0–36.0)
MCV: 96.1 fL (ref 80.0–100.0)
Monocytes Absolute: 0.6 K/uL (ref 0.1–1.0)
Monocytes Relative: 8 %
Neutro Abs: 3.8 K/uL (ref 1.7–7.7)
Neutrophils Relative %: 53 %
Platelets: 239 K/uL (ref 150–400)
RBC: 4.67 MIL/uL (ref 3.87–5.11)
RDW: 15.1 % (ref 11.5–15.5)
WBC: 7.1 K/uL (ref 4.0–10.5)
nRBC: 0 % (ref 0.0–0.2)

## 2023-08-20 LAB — COMPREHENSIVE METABOLIC PANEL WITH GFR
ALT: 22 U/L (ref 0–44)
AST: 34 U/L (ref 15–41)
Albumin: 3.9 g/dL (ref 3.5–5.0)
Alkaline Phosphatase: 59 U/L (ref 38–126)
Anion gap: 16 — ABNORMAL HIGH (ref 5–15)
BUN: 6 mg/dL — ABNORMAL LOW (ref 8–23)
CO2: 24 mmol/L (ref 22–32)
Calcium: 9.1 mg/dL (ref 8.9–10.3)
Chloride: 104 mmol/L (ref 98–111)
Creatinine, Ser: 0.69 mg/dL (ref 0.44–1.00)
GFR, Estimated: >60 mL/min (ref >60)
Glucose, Bld: 84 mg/dL (ref 70–99)
Potassium: 3.6 mmol/L (ref 3.5–5.1)
Sodium: 144 mmol/L (ref 135–145)
Total Bilirubin: 0.6 mg/dL (ref 0.0–1.2)
Total Protein: 7.3 g/dL (ref 6.5–8.1)

## 2023-08-20 LAB — PROTIME-INR
INR: 0.9 (ref 0.8–1.2)
Prothrombin Time: 13.2 s (ref 11.4–15.2)

## 2023-08-20 LAB — I-STAT CHEM 8, ED
BUN: 6 mg/dL — ABNORMAL LOW (ref 8–23)
Calcium, Ion: 1.01 mmol/L — ABNORMAL LOW (ref 1.15–1.40)
Chloride: 105 mmol/L (ref 98–111)
Creatinine, Ser: 0.8 mg/dL (ref 0.44–1.00)
Glucose, Bld: 99 mg/dL (ref 70–99)
HCT: 43 % (ref 36.0–46.0)
Hemoglobin: 14.6 g/dL (ref 12.0–15.0)
Potassium: 3.5 mmol/L (ref 3.5–5.1)
Sodium: 142 mmol/L (ref 135–145)
TCO2: 24 mmol/L (ref 22–32)

## 2023-08-20 LAB — LIPASE, BLOOD: Lipase: 28 U/L (ref 11–51)

## 2023-08-20 MED ORDER — PROCHLORPERAZINE EDISYLATE 10 MG/2ML IJ SOLN
10.0000 mg | Freq: Once | INTRAMUSCULAR | Status: AC
  Administered 2023-08-20: 10 mg via INTRAVENOUS
  Filled 2023-08-20: qty 2

## 2023-08-20 MED ORDER — DIPHENHYDRAMINE HCL 50 MG/ML IJ SOLN
25.0000 mg | Freq: Once | INTRAMUSCULAR | Status: AC
  Administered 2023-08-20: 25 mg via INTRAVENOUS
  Filled 2023-08-20: qty 1

## 2023-08-20 NOTE — ED Provider Notes (Signed)
 [place] [facility] AT [facility] Provider Note   CSN: [id number] Arrival date & time: 08/20/23  2054     Patient presents with: [place]    Deborah Jennings is a 63 y.o. female.   63 yo F with a chief complaints of left arm and left leg swelling.  She tells me it has been off and on for about 6 months but worsening over the past week.  The left leg is so swollen that she has been having a lot of pain there and having trouble ambulating and has fallen frequently.  Her husband has been worried about her and told her if she fell again that he would send her to the hospital to be evaluated.  She tried to get up to go to the bathroom lost her balance and fell and was sent here.  She denies any injury in the fall.  She has chronic back and neck pain.  Has chronic headaches.  Not significantly changed from baseline.        Prior to Admission medications   Medication Sig Start Date End Date Taking? Authorizing Provider  albuterol  (VENTOLIN  HFA) 108 (90 Base) MCG/ACT inhaler INHALE 2 PUFFS INTO THE LUNGS EVERY 6 HOURS AS NEEDED FOR WHEEZE OR SHORTNESS OF BREATH 08/03/23   Duanne Butler DASEN, MD  clonazePAM  (KLONOPIN ) 0.5 MG tablet TAKE 1 TABLET BY MOUTH THREE TIMES DAILY AS NEEDED FOR ANXIETY 10/30/22   Duanne Butler DASEN, MD  clotrimazole -betamethasone  (LOTRISONE ) cream Apply 1 Application topically daily. 06/01/23   Duanne Butler DASEN, MD  dicyclomine  (BENTYL ) 20 MG tablet Take 1 tablet (20 mg total) by mouth every 6 (six) hours as needed for spasms. 06/21/23   Duanne Butler DASEN, MD  escitalopram  (LEXAPRO ) 20 MG tablet Take 1 tablet by mouth once daily 06/25/23   Duanne Butler DASEN, MD  estradiol  (VIVELLE -DOT) 0.075 MG/24HR Place 1 patch onto the skin 2 (two) times a week. 12/22/21   Duanne Butler DASEN, MD  fluticasone -salmeterol (ADVAIR) 250-50 MCG/ACT AEPB Inhale 1 puff into the lungs in the morning and at bedtime. 06/08/23   Duanne Butler DASEN, MD  Fremanezumab -vfrm (AJOVY ) 225 MG/1.5ML  SOAJ Inject 225 mg into the skin every 30 (thirty) days. 05/29/22   Ines Onetha NOVAK, MD  furosemide  (LASIX ) 40 MG tablet Take 1 tablet by mouth twice daily 06/25/23   Duanne Butler DASEN, MD  gabapentin  (NEURONTIN ) 300 MG capsule Take 1 capsule (300 mg total) by mouth 3 (three) times daily. 06/01/23   Duanne Butler DASEN, MD  Multiple Vitamin (MULTIVITAMIN) tablet Take 1 tablet by mouth daily.    [provider]  Potassium Chloride  ER 20 MEQ TBCR Take 1 tablet by mouth once daily 10/29/22   Duanne Butler DASEN, MD  rosuvastatin  (CRESTOR ) 10 MG tablet Take 1 tablet by mouth once daily 03/01/23   Duanne Butler DASEN, MD  valsartan  (DIOVAN ) 160 MG tablet Take 1 tablet by mouth once daily 01/19/23   Duanne Butler DASEN, MD    Allergies: Ampicillin    Review of Systems  Updated Vital Signs BP 124/74   Pulse 94   Temp 98.5 F (36.9 C)   Resp 18   SpO2 96%   Physical Exam Vitals and nursing note reviewed.  Constitutional:      General: She is not in acute distress.    Appearance: She is well-developed. She is not diaphoretic.  HENT:     Head: Normocephalic and atraumatic.  Eyes:     Pupils: Pupils  are equal, round, and reactive to light.  Cardiovascular:     Rate and Rhythm: Normal rate and regular rhythm.     Heart sounds: No murmur heard.    No friction rub. No gallop.  Pulmonary:     Effort: Pulmonary effort is normal.     Breath sounds: No wheezing or rales.  Abdominal:     General: There is no distension.     Palpations: Abdomen is soft.     Tenderness: There is no abdominal tenderness.  Musculoskeletal:        General: No tenderness.     Cervical back: Normal range of motion and neck supple.     Comments: Left lower extremity is significantly swollen edema extends up to the thigh.  Intact pulse motor and sensation.  Mildly erythematous and warm.  No edema to the right leg.  Left upper extremity is also edematous.  Mostly noticed in the hand which is pitting but also extends up  into the upper arm.  No obvious edema to the neck or the face.    Skin:    General: Skin is warm and dry.  Neurological:     Mental Status: She is alert and oriented to person, place, and time.  Psychiatric:        Behavior: Behavior normal.     (all labs ordered are listed, but only abnormal results are displayed) Labs Reviewed  I-STAT CHEM 8, ED - Abnormal; Notable for the following components:      Result Value   BUN 6 (*)    Calcium , Ion 1.01 (*)    All other components within normal limits  CBC WITH DIFFERENTIAL/PLATELET  COMPREHENSIVE METABOLIC PANEL WITH GFR  LIPASE, BLOOD  PROTIME-INR    EKG: EKG Interpretation Date/Time:  Friday August 20 2023 20:56:59 EDT Ventricular Rate:  81 PR Interval:  180 QRS Duration:  90 QT Interval:  385 QTC Calculation: 447 R Axis:   56  Text Interpretation: Sinus rhythm Low voltage, precordial leads No old tracing to compare Confirmed by Emil Share [phone]) on 08/20/2023 9:42:28 PM  Radiology: [facility] Chest Port 1 View Result Date: 08/20/2023 CLINICAL DATA:  Fatigue. EXAM: PORTABLE CHEST 1 VIEW COMPARISON:  01/06/2021 FINDINGS: Patient is rotated. Stable heart size and mediastinal contours. Question of lingular opacity versus prominent epicardial fat pad. No pulmonary edema, pleural fluid or pneumothorax. IMPRESSION: Question of lingular opacity versus prominent epicardial fat pad. Electronically Signed   By: Andrea Gasman M.D.   On: 08/20/2023 21:35     Procedures   Medications Ordered in the ED  prochlorperazine (COMPAZINE) injection 10 mg (10 mg Intravenous Given 08/20/23 2137)  diphenhydrAMINE (BENADRYL) injection 25 mg (25 mg Intravenous Given 08/20/23 2137)                                    Medical Decision Making Amount and/or Complexity of Data Reviewed Labs: ordered. Radiology: ordered.  Risk Prescription drug management.   28 yo27 F with a chief complaints of frequent falls.  She tells me she has been falling a lot  in the last few days.  She thinks is because she is having trouble using her left leg.  She tells me it is very painful and has become very swollen.  This has been going on for at least the past week.  Her left arm is also quite swollen.  She denies any injury in  her falls.  Has chronic back and neck pain that she does not think are significantly changed.  Is currently having a migraine headache which is typical for her.  She was worried that she had had a stroke with the left leg weakness and had seen her primary care provider.  Had MRI imaging of the brain done in October that did not show a stroke.  I am not sure what would cause her to have edema in both the left arm and leg.  I will CT scan of the chest abdomen pelvis to assess for central clot.  CT venogram of the lower extremities to assess for DVT.  Blood work.  Chest x-ray.  Patient care signed out to Dr. Bari, please see their note for further details of care in the ED.  The patients results and plan were reviewed and discussed.   Any x-rays performed were independently reviewed by myself.   Differential diagnosis were considered with the presenting HPI.  Medications  prochlorperazine (COMPAZINE) injection 10 mg (10 mg Intravenous Given 08/20/23 2137)  diphenhydrAMINE (BENADRYL) injection 25 mg (25 mg Intravenous Given 08/20/23 2137)    Vitals:   08/20/23 2130 08/20/23 2145 08/20/23 2300 08/20/23 2315  BP: 131/82 113/66 118/64 124/74  Pulse: 91 88 92 94  Resp: 12 20 19 18   Temp:      SpO2: 98% 96% 96% 96%    Final diagnoses:  Left leg swelling  Left arm swelling    Admission/ observation were discussed with the admitting physician, patient and/or family and they are comfortable with the plan.       Final diagnoses:  Left leg swelling  Left arm swelling    ED Discharge Orders     None          Emil Share, DO 08/20/23 2320

## 2023-08-20 NOTE — ED Triage Notes (Signed)
 Patient arrived with EMS from home lost her balance and fell this evening , no LOC/alert and oriented , denies pain or injury , respirations unlabored , edema at extremities , received 1 NTG sl by EMS .

## 2023-08-20 NOTE — ED Notes (Signed)
 Delay in lab results, per main lab they are running behind.  KM

## 2023-08-21 ENCOUNTER — Emergency Department (HOSPITAL_COMMUNITY)

## 2023-08-21 ENCOUNTER — Encounter (HOSPITAL_COMMUNITY): Payer: Self-pay

## 2023-08-21 DIAGNOSIS — R531 Weakness: Secondary | ICD-10-CM

## 2023-08-21 LAB — CREATININE, SERUM
Creatinine, Ser: 0.77 mg/dL (ref 0.44–1.00)
GFR, Estimated: >60 mL/min (ref >60)

## 2023-08-21 LAB — TSH: TSH: 1.594 u[IU]/mL (ref 0.350–4.500)

## 2023-08-21 LAB — CBC
HCT: 41.7 % (ref 36.0–46.0)
Hemoglobin: 13.7 g/dL (ref 12.0–15.0)
MCH: 32.8 pg (ref 26.0–34.0)
MCHC: 32.9 g/dL (ref 30.0–36.0)
MCV: 99.8 fL (ref 80.0–100.0)
Platelets: 242 K/uL (ref 150–400)
RBC: 4.18 MIL/uL (ref 3.87–5.11)
RDW: 15.3 % (ref 11.5–15.5)
WBC: 7.9 K/uL (ref 4.0–10.5)
nRBC: 0 % (ref 0.0–0.2)

## 2023-08-21 LAB — MAGNESIUM: Magnesium: 1.8 mg/dL (ref 1.7–2.4)

## 2023-08-21 LAB — HIV ANTIBODY (ROUTINE TESTING W REFLEX): HIV Screen 4th Generation wRfx: NONREACTIVE

## 2023-08-21 LAB — PHOSPHORUS: Phosphorus: 4.6 mg/dL (ref 2.5–4.6)

## 2023-08-21 LAB — I-STAT CG4 LACTIC ACID, ED
Lactic Acid, Venous: 1.8 mmol/L (ref 0.5–1.9)
Lactic Acid, Venous: 1.7 mmol/L (ref 0.5–1.9)

## 2023-08-21 MED ORDER — IOHEXOL 350 MG/ML SOLN
125.0000 mL | Freq: Once | INTRAVENOUS | Status: AC | PRN
  Administered 2023-08-21: 125 mL via INTRAVENOUS

## 2023-08-21 MED ORDER — ROSUVASTATIN CALCIUM 5 MG PO TABS
10.0000 mg | ORAL_TABLET | Freq: Every day | ORAL | Status: DC
Start: 2023-08-21 — End: 2023-08-25
  Administered 2023-08-21 – 2023-08-25 (×5): 10 mg via ORAL
  Filled 2023-08-21 (×5): qty 2

## 2023-08-21 MED ORDER — ENOXAPARIN SODIUM 40 MG/0.4ML IJ SOSY
40.0000 mg | PREFILLED_SYRINGE | INTRAMUSCULAR | Status: DC
  Administered 2023-08-21 – 2023-08-24 (×4): 40 mg via SUBCUTANEOUS
  Filled 2023-08-21 (×4): qty 0.4

## 2023-08-21 MED ORDER — ACETAMINOPHEN 650 MG RE SUPP: 650.0000 mg | Freq: Four times a day (QID) | RECTAL | Status: DC | PRN

## 2023-08-21 MED ORDER — ESCITALOPRAM OXALATE 10 MG PO TABS
20.0000 mg | ORAL_TABLET | Freq: Every day | ORAL | Status: DC
  Administered 2023-08-21 – 2023-08-25 (×5): 20 mg via ORAL
  Filled 2023-08-21 (×5): qty 2

## 2023-08-21 MED ORDER — GABAPENTIN 300 MG PO CAPS
300.0000 mg | ORAL_CAPSULE | Freq: Three times a day (TID) | ORAL | Status: DC
  Administered 2023-08-21 – 2023-08-25 (×12): 300 mg via ORAL
  Filled 2023-08-21 (×12): qty 1

## 2023-08-21 MED ORDER — OMEPRAZOLE MAGNESIUM 20 MG PO TBEC: 20.0000 mg | DELAYED_RELEASE_TABLET | Freq: Every day | ORAL | Status: DC | PRN

## 2023-08-21 MED ORDER — HYDRALAZINE HCL 20 MG/ML IJ SOLN
10.0000 mg | Freq: Four times a day (QID) | INTRAMUSCULAR | Status: DC | PRN
  Administered 2023-08-23: 10 mg via INTRAVENOUS
  Filled 2023-08-21: qty 1

## 2023-08-21 MED ORDER — GABAPENTIN 300 MG PO CAPS
300.0000 mg | ORAL_CAPSULE | ORAL | Status: AC
  Administered 2023-08-21: 300 mg via ORAL
  Filled 2023-08-21: qty 1

## 2023-08-21 MED ORDER — FUROSEMIDE 40 MG PO TABS
40.0000 mg | ORAL_TABLET | Freq: Two times a day (BID) | ORAL | Status: DC
  Administered 2023-08-21 – 2023-08-25 (×8): 40 mg via ORAL
  Filled 2023-08-21 (×8): qty 1

## 2023-08-21 MED ORDER — DICYCLOMINE HCL 20 MG PO TABS
20.0000 mg | ORAL_TABLET | Freq: Four times a day (QID) | ORAL | Status: DC | PRN
  Administered 2023-08-21 – 2023-08-22 (×2): 20 mg via ORAL
  Filled 2023-08-21 (×4): qty 1

## 2023-08-21 MED ORDER — ONDANSETRON HCL 4 MG PO TABS: 4.0000 mg | ORAL_TABLET | Freq: Four times a day (QID) | ORAL | Status: DC | PRN

## 2023-08-21 MED ORDER — OXYCODONE HCL 5 MG PO TABS
5.0000 mg | ORAL_TABLET | ORAL | Status: DC | PRN
  Administered 2023-08-21 – 2023-08-25 (×16): 5 mg via ORAL
  Filled 2023-08-21 (×16): qty 1

## 2023-08-21 MED ORDER — ACETAMINOPHEN 325 MG PO TABS
650.0000 mg | ORAL_TABLET | Freq: Once | ORAL | Status: AC
  Administered 2023-08-21: 650 mg via ORAL
  Filled 2023-08-21: qty 2

## 2023-08-21 MED ORDER — PANTOPRAZOLE SODIUM 40 MG PO TBEC
40.0000 mg | DELAYED_RELEASE_TABLET | Freq: Every day | ORAL | Status: DC | PRN
  Administered 2023-08-21 – 2023-08-22 (×2): 40 mg via ORAL
  Filled 2023-08-21 (×3): qty 1

## 2023-08-21 MED ORDER — ACETAMINOPHEN 325 MG PO TABS
650.0000 mg | ORAL_TABLET | Freq: Four times a day (QID) | ORAL | Status: DC | PRN
  Administered 2023-08-21 – 2023-08-23 (×4): 650 mg via ORAL
  Filled 2023-08-21 (×5): qty 2

## 2023-08-21 MED ORDER — ONDANSETRON HCL 4 MG/2ML IJ SOLN
4.0000 mg | Freq: Four times a day (QID) | INTRAMUSCULAR | Status: DC | PRN
  Administered 2023-08-22: 4 mg via INTRAVENOUS
  Filled 2023-08-21: qty 2

## 2023-08-21 NOTE — ED Notes (Signed)
 Pt stated that she has a chronic pinched nerve in her back as well as very poor balance and sever swelling of her legs and she does not feel comfortable standing to walk. Pt has been requesting to use a bedpan while she is here in the ED and has not made any effort to move her body.

## 2023-08-21 NOTE — ED Provider Notes (Signed)
 Patient signed out pending CT imaging.  CT imaging is unremarkable without obvious clot or abnormality on the left side of the body which would result in her swelling.  Attempted to ambulate the patient but per RN she required maximal assist.  Will place TOC order and PT orders as she is not safe for discharge home given recurrent falls.  Physical Exam  BP (!) 139/99   Pulse 84   Temp 97.8 F (36.6 C) (Temporal)   Resp 12   SpO2 94%   Physical Exam Awake, alert, no acute distress Swelling left greater than right lower extremity. Procedures  Procedures  ED Course / MDM    Medical Decision Making Amount and/or Complexity of Data Reviewed Labs: ordered. Radiology: ordered.  Risk Prescription drug management.          Bari Charmaine FALCON, MD 08/21/23 [phone]

## 2023-08-21 NOTE — H&P (Addendum)
 History and Physical    Deborah Jennings FMW:[id number] DOB: 1960-05-02 DOA: 08/20/2023  PCP: Duanne Butler DASEN, MD  Patient coming from: Home  I have personally briefly reviewed patient's old medical records in [facility]  Chief Complaint: I have a back pain and pinched nerve.  I am falling frequently  HPI: Deborah Jennings is a 63 y.o. female with medical history significant of migraine, IBS, chronic lower back pain brought by EMS from home for evaluation after fall.    Complaining of left-sided weakness and swelling in left upper and lower extremities on and off for several months however for the past week it seems to be getting worse.  Due to left-sided  pain, weakness and swelling she is unable to ambulate.  Using walking stick to avoid falls, holding walls to go to the bathroom.  Had 2 falls in past 1 month.  She denies any loss of consciousness, head trauma, seizure-like episode.  No prior history of DVT.  Does have a history of migraine which is at baseline.  Denies blurry vision, slurred speech, facial drop, fever, chills, nausea, vomiting cough, congestion, UTI symptoms.  Followed by outpatient physical therapy.  Lives with husband at home.  Former smoker, drinks couple of beers daily, denies any illicit drug use.  Of note: Per chart review-PCP note from 06/01/2023 shows patient has left leg weakness and MRI of the brain in October 2024 showed no evidence of a stroke.  Plan was to get MRI of lumbar spine outpatient.  ED Course: Upon arrival to ED: Patient afebrile, pulse 80, RR: 19, BP 115/75.  CBC shows no leukocytosis H&H is 15.3/44.9, PLT 239.  NA 144, K: 3.6, creatinine 0.69.  Lipase, INR: WNL.  Extensive workup including CT head, CT venogram abdomen pelvis and lower extremity bilateral, CT chest and MRI brain resulted negative for any acute findings.  [facility] hospital[facility] consulted for admission.  Review of Systems: As per HPI otherwise negative.    Past Medical History:   Diagnosis Date   ASCUS of cervix with negative high risk HPV 04/2017   Asthma    Back pain    low back pain   Endometriosis    IBS (irritable bowel syndrome)    Migraine     Past Surgical History:  Procedure Laterality Date   ABDOMINAL HYSTERECTOMY     RSO   CESAREAN SECTION     DILATION AND CURETTAGE OF UTERUS     ganglion cyst removal Right    foot   lower back surgery     FUSION AND DISC REMOVED   OOPHORECTOMY     RSO   PELVIC LAPAROSCOPY     Lysis of adhesions-Endometriosis     reports that she quit smoking about 39 years ago. Her smoking use included cigarettes. She has never used smokeless tobacco. She reports current alcohol use of about 5.0 standard drinks of alcohol per week. She reports that she does not use drugs.  Allergies  Allergen Reactions   Omnipen [Ampicillin] Hives and Swelling    OK with penicillin    Family History  Problem Relation Age of Onset   Migraines Mother    Hypertension Mother    Dementia Mother    Migraines Father    Diabetes Father    Hypertension Father    Migraines Maternal Grandmother    Migraines Maternal Grandfather    Migraines Son     Prior to Admission medications   Medication Sig Start Date End Date Taking? Authorizing  Provider  acetaminophen  (TYLENOL ) 500 MG tablet Take 1,000 mg by mouth 2 (two) times daily as needed for moderate pain (pain score 4-6) or headache.   Yes [provider]  ALPRAZolam  (XANAX ) 0.5 MG tablet Take 0.5 mg by mouth at bedtime as needed for sleep.   Yes [provider]  clonazePAM  (KLONOPIN ) 0.5 MG tablet TAKE 1 TABLET BY MOUTH THREE TIMES DAILY AS NEEDED FOR ANXIETY 10/30/22  Yes Duanne Butler DASEN, MD  dicyclomine  (BENTYL ) 20 MG tablet Take 1 tablet (20 mg total) by mouth every 6 (six) hours as needed for spasms. Patient taking differently: Take 20 mg by mouth See admin instructions. Take 1 tablet (20mg ) by mouth twice daily but may take 1 tablet up to every 6 hours if needed  for IBS. 06/21/23  Yes Duanne Butler DASEN, MD  escitalopram  (LEXAPRO ) 20 MG tablet Take 1 tablet by mouth once daily 06/25/23  Yes Duanne Butler DASEN, MD  furosemide  (LASIX ) 40 MG tablet Take 1 tablet by mouth twice daily 06/25/23  Yes Duanne Butler DASEN, MD  gabapentin  (NEURONTIN ) 300 MG capsule Take 1 capsule (300 mg total) by mouth 3 (three) times daily. Patient taking differently: Take 300 mg by mouth 2 (two) times daily. 06/01/23  Yes Duanne Butler DASEN, MD  Multiple Vitamins-Minerals (MULTIVITAMIN WOMEN 50+) TABS Take 1 tablet by mouth daily.   Yes [provider]  omeprazole  (PRILOSEC  OTC) 20 MG tablet Take 20 mg by mouth daily as needed (heartburn).   Yes [provider]  ondansetron  (ZOFRAN -ODT) 4 MG disintegrating tablet Take 4 mg by mouth every 8 (eight) hours as needed for nausea or vomiting.   Yes [provider]  Rimegepant Sulfate (NURTEC) 75 MG TBDP Take 75 mg by mouth daily as needed (migraine).   Yes [provider]  rizatriptan  (MAXALT -MLT) 10 MG disintegrating tablet Take 10 mg by mouth daily as needed for migraine. May repeat dose in two hour if headache persists or recurs. Do not exceed 2 tablets in 24 hours.   Yes [provider]  rosuvastatin  (CRESTOR ) 10 MG tablet Take 1 tablet by mouth once daily 03/01/23  Yes Pickard, Butler DASEN, MD  valACYclovir  (VALTREX ) 500 MG tablet Take 500 mg by mouth daily.   Yes [provider]    Physical Exam: Vitals:   08/21/23 0845 08/21/23 0927 08/21/23 1015 08/21/23 1130  BP: (!) 154/93  139/88 (!) 128/90  Pulse: 84  75 78  Resp: 18  15 17   Temp:  98 F (36.7 C)    TempSrc:  Oral    SpO2: 96%  96% 98%    Constitutional: NAD, calm, comfortable, on room air, sleepy but arousable answers appropriately Eyes: PERRL, lids and conjunctivae normal ENMT: Mucous membranes are moist. Posterior pharynx clear of any exudate or lesions.Normal dentition.  Neck: normal, supple, no masses, no  thyromegaly Respiratory: clear to auscultation bilaterally, no wheezing, no crackles. Normal respiratory effort. No accessory muscle use.  Cardiovascular: Regular rate and rhythm, no murmurs / rubs / gallops. No extremity edema. 2+ pedal pulses. No carotid bruits.  Abdomen: no tenderness, no masses palpated. No hepatosplenomegaly. Bowel sounds positive.  Musculoskeletal: Left-sided 3+ pitting edema positive in left upper and lower extremity.  Tender on palpation. Neurologic: Alert and oriented x 3.  Unable to lift left upper and lower extremity due to pain. Psychiatric: Normal judgment and insight. Alert and oriented x 3. Normal mood.    Labs on Admission: I have personally reviewed following labs  and imaging studies  CBC: Recent Labs  Lab 08/20/23 2119 08/20/23 2314  WBC 7.1  --   NEUTROABS 3.8  --   HGB 15.3* 14.6  HCT 44.9 43.0  MCV 96.1  --   PLT 239  --    Basic Metabolic Panel: Recent Labs  Lab 08/20/23 2119 08/20/23 2314  NA 144 142  K 3.6 3.5  CL 104 105  CO2 24  --   GLUCOSE 84 99  BUN 6* 6*  CREATININE 0.69 0.80  CALCIUM  9.1  --    GFR: CrCl cannot be calculated (Unknown ideal weight.). Liver Function Tests: Recent Labs  Lab 08/20/23 2119  AST 34  ALT 22  ALKPHOS 59  BILITOT 0.6  PROT 7.3  ALBUMIN 3.9   Recent Labs  Lab 08/20/23 2119  LIPASE 28   No results for input(s): AMMONIA in the last 168 hours. Coagulation Profile: Recent Labs  Lab 08/20/23 2119  INR 0.9   Cardiac Enzymes: No results for input(s): CKTOTAL, CKMB, CKMBINDEX, TROPONINI in the last 168 hours. BNP (last 3 results) No results for input(s): PROBNP in the last 8760 hours. HbA1C: No results for input(s): HGBA1C in the last 72 hours. CBG: No results for input(s): GLUCAP in the last 168 hours. Lipid Profile: No results for input(s): CHOL, HDL, LDLCALC, TRIG, CHOLHDL, LDLDIRECT in the last 72 hours. Thyroid  Function Tests: No results for  input(s): TSH, T4TOTAL, FREET4, T3FREE, THYROIDAB in the last 72 hours. Anemia Panel: No results for input(s): VITAMINB12, FOLATE, FERRITIN, TIBC, IRON, RETICCTPCT in the last 72 hours. Urine analysis:    Component Value Date/Time   COLORURINE YELLOW 08/14/2020 1231   APPEARANCEUR CLEAR 08/14/2020 1231   LABSPEC 1.015 08/14/2020 1231   PHURINE 5.5 08/14/2020 1231   GLUCOSEU NEGATIVE 08/14/2020 1231   HGBUR NEGATIVE 08/14/2020 1231   BILIRUBINUR NEGATIVE 04/21/2016 1144   KETONESUR NEGATIVE 08/14/2020 1231   PROTEINUR TRACE (A) 08/14/2020 1231   UROBILINOGEN 0.2 07/16/2014 1636   NITRITE NEGATIVE 08/14/2020 1231   LEUKOCYTESUR NEGATIVE 08/14/2020 1231    Radiological Exams on Admission: MR BRAIN WO CONTRAST Result Date: 08/21/2023 CLINICAL DATA:  Provided history: Neuro deficit, acute, stroke suspected. EXAM: MRI HEAD WITHOUT CONTRAST TECHNIQUE: Multiplanar, multiecho pulse sequences of the brain and surrounding structures were obtained without intravenous contrast. COMPARISON:  Head CT 08/21/2023.  Brain MRI 11/16/2022. FINDINGS: Intermittently motion degraded examination (with up to moderate motion degradation of the acquired sequences). Brain: Mild generalized cerebral atrophy. Mild multifocal T2 FLAIR hyperintense signal abnormality within the cerebral white matter, nonspecific but most often secondary to chronic small vessel ischemia. No cortical encephalomalacia is identified. There is no acute infarct. No evidence of an intracranial mass. No chronic intracranial blood products. No extra-axial fluid collection. No midline shift. Vascular: Maintained flow voids within the proximal large arterial vessels. Skull and upper cervical spine: No focal worrisome marrow lesion. Sinuses/Orbits: No mass or acute finding within the imaged orbits. No significant paranasal sinus disease. IMPRESSION: 1. Intermittently motion degraded examination. 2. No evidence of an acute  intracranial abnormality. The diffusion-weighted imaging is of good quality and there is no evidence of an acute infarct. 3. Mild multifocal T2 FLAIR hyperintense signal abnormality within the cerebral white matter, nonspecific but most often secondary to chronic small vessel ischemia. These findings are similar to the prior MRI of 11/16/2022. 4. Mild generalized cerebral atrophy. Electronically Signed   By: Rockey Childs D.O.   On: 08/21/2023 10:18   CT VENOGRAM ABD/PELVIS/LOWER EXT BILAT  Result Date: 08/21/2023 EXAM: CTA ABDOMEN AND PELVIS WITH CONTRAST AND RUNOFF CTA OF THE LOWER EXTREMITIES WITH CONTRAST 08/21/2023 12:57:40 AM TECHNIQUE: CTA images of the abdomen, pelvis and lower extremities with intravenous contrast. Three-dimensional MIP/volume rendered formations were performed. Automated exposure control, iterative reconstruction, and/or weight based adjustment of the mA/kV was utilized to reduce the radiation dose to as low as reasonably achievable. COMPARISON: None available. CLINICAL HISTORY: Deep venous thrombosis (DVT); Lymphadenopathy, groin. FINDINGS: VASCULATURE: No evidence of venous thrombosis to the level of the popliteal veins. AORTA: No acute finding. No abdominal aortic aneurysm. No dissection. CELIAC TRUNK: No acute finding. No occlusion or significant stenosis. SUPERIOR MESENTERIC ARTERY: No acute finding. No occlusion or significant stenosis. INFERIOR MESENTERIC ARTERY: No acute finding. No occlusion or significant stenosis. RENAL ARTERIES: No acute finding. No occlusion or significant stenosis. RIGHT ILIAC ARTERIES: No acute finding. No occlusion or significant stenosis. RIGHT FEMORAL ARTERIES: No acute finding. No occlusion or significant stenosis. RIGHT POPLITEAL ARTERY: No acute finding. No occlusion or significant stenosis. RIGHT CALF ARTERIES: No acute finding. No occlusion or significant stenosis. LEFT ILIAC ARTERIES: No acute finding. No occlusion or significant stenosis. LEFT  FEMORAL ARTERIES: No acute finding. No occlusion or significant stenosis. LEFT POPLITEAL ARTERY: No acute finding. No occlusion or significant stenosis. LEFT CALF ARTERIES: No acute finding. No occlusion or significant stenosis. ABDOMEN AND PELVIS: LOWER CHEST: Visualized portion of the lower chest demonstrates no acute abnormality. LIVER: The liver is unremarkable. GALLBLADDER AND BILE DUCTS: Gallbladder is unremarkable. No biliary ductal dilatation. SPLEEN: The spleen is unremarkable. PANCREAS: The pancreas is unremarkable. ADRENAL GLANDS: Bilateral adrenal glands demonstrate no acute abnormality. KIDNEYS, URETERS AND BLADDER: No stones in the kidneys or ureters. No hydronephrosis. No evidence of perinephric or periureteral stranding. Urinary bladder is unremarkable. GI AND BOWEL: Stomach and duodenal sweep demonstrate no acute abnormality. There is no bowel obstruction. No abnormal bowel wall thickening or distension. Normal appendix (image 62). Mild sigmoid diverticulosis, without evidence of diverticulitis. REPRODUCTIVE: Status post hysterectomy. PERITONEUM AND RETROPERITONEUM: No ascites or free air. LYMPH NODES: No evidence of lymphadenopathy. BONES AND SOFT TISSUES: Mild degenerative changes at L5-S1. Mild subcutaneous stranding/fluid along the left lateral thigh (image 128) and extending inferiorly to the left lateral upper calf (image 163). IMPRESSION: 1. No evidence of venous thrombosis to the level of the popliteal veins. 2. Mild subcutaneous stranding/fluid along the left lateral thigh and extending inferiorly to the left lateral upper calf. 3. Mild sigmoid diverticulosis, without evidence of diverticulitis. Electronically signed by: Pinkie Pebbles MD 08/21/2023 01:12 AM EDT RP Workstation: [id number]   CT CHEST W CONTRAST Result Date: 08/21/2023 EXAM: CT CHEST WITH CONTRAST 08/21/2023 12:57:40 AM TECHNIQUE: CT of the chest was performed with the administration of intravenous contrast. Multiplanar  reformatted images are provided for review. Automated exposure control, iterative reconstruction, and/or weight based adjustment of the mA/kV was utilized to reduce the radiation dose to as low as reasonably achievable. COMPARISON: None available. CLINICAL HISTORY: Lymphadenopathy. FINDINGS: MEDIASTINUM: Heart and pericardium are unremarkable. The central airways are clear. LYMPH NODES: No suspicious mediastinal, hilar, or axillary lymphadenopathy. 8 mm short axis left axillary node (61), within normal limits. LUNGS AND PLEURA: Mild bibasilar atelectasis. No focal consolidation or pulmonary edema. No pleural effusion or pneumothorax. SOFT TISSUES/BONES: No acute abnormality of the bones or soft tissues. UPPER ABDOMEN: Limited images of the upper abdomen demonstrates no acute abnormality. IMPRESSION: 1. Negative CT chest. Electronically signed by: Pinkie Pebbles MD 08/21/2023 01:08 AM EDT RP Workstation: [id number]  CT Head Wo Contrast Result Date: 08/21/2023 EXAM: CT HEAD WITHOUT CONTRAST 08/21/2023 12:57:40 AM TECHNIQUE: CT of the head was performed without the administration of intravenous contrast. Automated exposure control, iterative reconstruction, and/or weight based adjustment of the mA/kV was utilized to reduce the radiation dose to as low as reasonably achievable. COMPARISON: None available. CLINICAL HISTORY: Vertigo, central. FINDINGS: BRAIN AND VENTRICLES: No acute hemorrhage. Gray-white differentiation is preserved. No hydrocephalus. No extra-axial collection. No mass effect or midline shift. ORBITS: No acute abnormality. SINUSES: No acute abnormality. SOFT TISSUES AND SKULL: No acute soft tissue abnormality. No skull fracture. IMPRESSION: 1. No acute intracranial abnormality. Electronically signed by: Franky Stanford MD 08/21/2023 01:01 AM EDT RP Workstation: [id number]   DG Chest Port 1 View Result Date: 08/20/2023 CLINICAL DATA:  Fatigue. EXAM: PORTABLE CHEST 1 VIEW COMPARISON:  01/06/2021  FINDINGS: Patient is rotated. Stable heart size and mediastinal contours. Question of lingular opacity versus prominent epicardial fat pad. No pulmonary edema, pleural fluid or pneumothorax. IMPRESSION: Question of lingular opacity versus prominent epicardial fat pad. Electronically Signed   By: Andrea Gasman M.D.   On: 08/20/2023 21:35    EKG: Independently reviewed.  Sinus rhythm.  No acute ST-T wave changes noted.  Assessment/Plan  Left-sided weakness and painful swelling: - Unknown underlying etiology.  Could be lymphedema, vasculitis?  Workup including labs, CT head, CT venogram abdomen pelvis and bilateral lower extremity, MRI brain, CT chest with contrast all resulted negative for any acute findings/DVT or stroke - Will get neurology evaluation.  Will continue home dose of Lasix .  Will get Doppler ultrasound of left upper extremity - Consult PT/OT - If left upper extremity Doppler resulted negative-can consider IR consult for lymph angiogram?  Recurrent falls: Balance issues: Chronic back pain/lumbar radiculopathy: - Consult PT/OT.  Continue gabapentin   Chronic migraine: On Maxalt   and Nurtec as needed at home.  Followed by neurology outpatient.  Hypercholesteremia: Continue statin  GERD: Continue PPI  IBS: Continue Bentyl   Anxiety: Continue Lexapro   Well-controlled type 2 diabetes: Last A1c checked was 6.3 on 05/2023  Dyspnea on exertion: - Evaluated by cardiology back in July 2024.  On Lasix  40 twice daily per cardiology note - Echocardiogram done on 02/26/2022 showed preserved ejection fraction, no wall motion abnormality.   DVT prophylaxis: Lovenox  Code Status: Full code Family Communication: None present at bedside.  Plan of care discussed with patient in length and he verbalized understanding and agreed with it.  Discussed plan of care with the patient's husband on the phone.  He is concerned about patient's ongoing symptoms and frustrated nobody could figure it  out underlying cause.  Disposition Plan: To be determined Consults called: Neurology Admission status: Inpatient   Velna JONELLE Skeeter MD [facility]  If 7PM-7AM, please contact night-coverage [email or web address]  08/21/2023, 11:46 AM

## 2023-08-21 NOTE — ED Provider Notes (Signed)
  Physical Exam  BP (!) 143/92   Pulse 89   Temp 97.8 F (36.6 C) (Temporal)   Resp 16   SpO2 98%   Physical Exam  Procedures  Procedures  ED Course / MDM    Medical Decision Making Amount and/or Complexity of Data Reviewed Labs: ordered. Radiology: ordered.  Risk OTC drugs. Prescription drug management.   63 yo female with left leg and arm swelling and weakness for 2 weeks Patient had  ct head- no acute abnormalitities and mri venogram abd/pelvis/lower without acute venous thrombosis. Patient with edema and weakness to left arm and left leg ? Mild facial droop on left MR brain without acute infarct but mild multifocal t2 flair ?chronic small vessel ischemia Denies significant etoh use, endorses chronic back pain  Will consult for admission Care discussed with Dr. Vernon who will see patient for admission.  She requested consult to neurology.  Neurology has been paged        Levander Houston, MD 08/26/23 1146

## 2023-08-21 NOTE — ED Notes (Signed)
 Patient transported to CT scan .

## 2023-08-21 NOTE — ED Notes (Signed)
 Pt requesting to have the rings on her left hand cut off. Pt stated that her hand is too swollen to remove them and she does not want to risk losing her finger.

## 2023-08-21 NOTE — ED Notes (Signed)
 Patient states that she is unable to ambulate due unsteady gait/legs weakness. EDP notified .

## 2023-08-21 NOTE — ED Notes (Signed)
PT returns from MRI 

## 2023-08-21 NOTE — ED Notes (Signed)
 RN assisted patient to ambulate to toilet and back to her room with maximum assistance .

## 2023-08-21 NOTE — ED Notes (Signed)
 Attempted to draw labs from IV, iv was dislodged. Pt transported to MRI. Will obtain labs when pt returns.

## 2023-08-21 NOTE — Evaluation (Signed)
 Physical Therapy Evaluation Patient Details Name: Deborah Jennings MRN: [id number] DOB: 1960-04-14 Today's Date: 08/21/2023  History of Present Illness  63 yo F arrived via EMS 7/18 after falling. Complaints of left arm and left leg swelling and pain over last 6 months that have contributed to fall. MRI 11/2022 negative for stroke. 08/21/23 CT  imaging is unremarkable without obvious clot or abnormality on the left side of the body which would result in her swelling. PMH: none official in chart. Pt reports chronic headache, and resection of 2 brain tumors.  Clinical Impression  PTA pt living with husband in single story home with 3 steps to enter. Pt with increasing difficulty with ambulation and ADLs in last 6 months with onset of increasing L sided edema. Pt ambulating household distances without AD but reports numerous falls and is only able to take bird baths at the sink due to not being able to get into the tub shower. Pt is limited in safe mobility, by decreased L sided ROM, sensation and strength necessary to move heavier L side. Pt is mod A for bed mobility, min A for transfers and minA progressing to modA for ambulation with RW 25 feet to bathroom. PT recommending OPPT at discharge to work on ROM, strength, balance and endurance. PT will continue to follow acutely and will refer to Mobility Specialist.         If plan is discharge home, recommend the following: A little help with walking and/or transfers;A little help with bathing/dressing/bathroom;Assistance with cooking/housework;Assist for transportation;Help with stairs or ramp for entrance   Can travel by private vehicle    Yes    Equipment Recommendations None recommended by PT  Recommendations for Other Services  OT consult    Functional Status Assessment Patient has had a recent decline in their functional status and demonstrates the ability to make significant improvements in function in a reasonable and predictable amount of  time.     Precautions / Restrictions Precautions Precautions: Fall Precaution/Restrictions Comments: numerous fall prior to coming to ED Restrictions Weight Bearing Restrictions Per Provider Order: No      Mobility  Bed Mobility Overal bed mobility: Needs Assistance Bed Mobility: Supine to Sit, Sit to Supine     Supine to sit: HOB elevated, Used rails, Mod assist Sit to supine: Min assist, Used rails   General bed mobility comments: modA for pt to pull against therapist to bring trunk to upright and for scooting hips to EoB    Transfers Overall transfer level: Needs assistance Equipment used: Rolling walker (2 wheels) Transfers: Sit to/from Stand Sit to Stand: Min assist, From elevated surface           General transfer comment: min A for power up and steadying in RW  from elevated ED stretcher, pt able to use hand rail in bathroom to power up from toilet    Ambulation/Gait Ambulation/Gait assistance: Min assist, Mod assist Gait Distance (Feet): 25 Feet (x2) Assistive device: Rolling walker (2 wheels) Gait Pattern/deviations: Step-to pattern, Decreased step length - right, Decreased stance time - left, Decreased weight shift to left, Shuffle, Antalgic, Trunk flexed Gait velocity: slowed Gait velocity interpretation: <1.31 ft/sec, indicative of household ambulator   General Gait Details: min progressing to modA secondary to progressive weakness in L LE likely secondary to increased weight of L LE for ambulation 25 feet to bathroom, similar response with ambulation back to room. verbal cues for proximity to RW and increased UE on RW for stability  Balance Overall balance assessment: Needs assistance Sitting-balance support: Feet supported, No upper extremity supported Sitting balance-Leahy Scale: Good     Standing balance support: No upper extremity supported, During functional activity, Single extremity supported, Bilateral upper extremity  supported Standing balance-Leahy Scale: Fair Standing balance comment: benefits from UE support with dynamic activities                             Pertinent Vitals/Pain Pain Assessment Pain Assessment: Faces Faces Pain Scale: Hurts even more Pain Location: L side LE>UE, back Pain Descriptors / Indicators: Pressure, Tightness, Tingling, Sore, Throbbing Pain Intervention(s): Limited activity within patient's tolerance, Monitored during session, Repositioned    Home Living Family/patient expects to be discharged to:: Private residence Living Arrangements: Spouse/significant other Available Help at Discharge: Available 24 hours/day Type of Home: House Home Access: Stairs to enter Entrance Stairs-Rails: Can reach both Entrance Stairs-Number of Steps: 3   Home Layout: One level Home Equipment: Agricultural consultant (2 wheels);BSC/3in1      Prior Function Prior Level of Function : Needs assist       Physical Assist : ADLs (physical)     Mobility Comments: increasing difficulty with ambulation without AD, numerous falls ADLs Comments: independent with ADLs but has been taking bird baths due to increased L sided weakness. husband assists with iADLs     Extremity/Trunk Assessment   Upper Extremity Assessment Upper Extremity Assessment: LUE deficits/detail LUE Deficits / Details: ROM limited by edema, strength grossly 3/5 LUE Sensation: decreased light touch (distal>proximal) LUE Coordination: decreased fine motor    Lower Extremity Assessment Lower Extremity Assessment: LLE deficits/detail LLE Deficits / Details: increased edema limiting end range ROM in hip, knee and ankle, especially with dorsiflexion, strength grossly 3/5 LLE Sensation: decreased light touch LLE Coordination: decreased fine motor    Cervical / Trunk Assessment Cervical / Trunk Assessment: Kyphotic  Communication   Communication Communication: No apparent difficulties    Cognition Arousal:  Alert Behavior During Therapy: WFL for tasks assessed/performed   PT - Cognitive impairments: No apparent impairments                         Following commands: Intact       Cueing Cueing Techniques: Verbal cues, Gestural cues, Tactile cues     General Comments General comments (skin integrity, edema, etc.): extensive L sided edema requiring increased strength to move, VSS on RA        Assessment/Plan    PT Assessment Patient needs continued PT services  PT Problem List Decreased strength;Decreased range of motion;Decreased activity tolerance;Decreased balance;Decreased mobility;Decreased coordination;Impaired sensation;Pain       PT Treatment Interventions DME instruction;Gait training;Stair training;Functional mobility training;Therapeutic activities;Therapeutic exercise;Balance training;Cognitive remediation;Patient/family education    PT Goals (Current goals can be found in the Care Plan section)  Acute Rehab PT Goals Patient Stated Goal: decrease swelling PT Goal Formulation: With patient Time For Goal Achievement: 09/04/23 Potential to Achieve Goals: Good    Frequency Min 2X/week        AM-PAC PT 6 Clicks Mobility  Outcome Measure Help needed turning from your back to your side while in a flat bed without using bedrails?: None Help needed moving from lying on your back to sitting on the side of a flat bed without using bedrails?: None Help needed moving to and from a bed to a chair (including a wheelchair)?: A Little Help needed standing up from  a chair using your arms (e.g., wheelchair or bedside chair)?: A Little Help needed to walk in hospital room?: A Lot Help needed climbing 3-5 steps with a railing? : A Lot 6 Click Score: 18    End of Session Equipment Utilized During Treatment: Gait belt Activity Tolerance: Patient limited by pain;Treatment limited secondary to medical complications (Comment) (increased L sided edema) Patient left: in  bed;Other (comment) (transport present to take to MRI) Nurse Communication: Mobility status PT Visit Diagnosis: Unsteadiness on feet (R26.81);Other abnormalities of gait and mobility (R26.89);Muscle weakness (generalized) (M62.81);History of falling (Z91.81);Repeated falls (R29.6);Difficulty in walking, not elsewhere classified (R26.2);Pain Pain - Right/Left: Left Pain - part of body: Leg    Time:  855- 929      Charges:   PT Evaluation $PT Eval Moderate Complexity: 1 Mod   PT General Charges $$ ACUTE PT VISIT: 1 Visit         Landyn Buckalew B. Fleeta Lapidus PT, DPT Acute Rehabilitation Services Please use secure chat or  Call Office [phone]   [facility] 08/21/2023, 1:29 PM

## 2023-08-21 NOTE — ED Notes (Signed)
 Pt is at bedside.

## 2023-08-21 NOTE — Plan of Care (Signed)

## 2023-08-21 NOTE — Care Management (Signed)
 Transition of Care [facility]) - Emergency Department Mini Assessment   Patient Details  Name: Deborah Jennings MRN: [id number] Date of Birth: February 16, 1960  Transition of Care [facility]) CM/SW Contact:    Corean JAYSON Canary, RN Phone Number: 08/21/2023, 11:14 AM   Clinical Narrative:  Patient presented with swelling, difficulty walking, worried about going home, she has frequent falls. PT assessment revealed need for OP PT, will send referral to [facility].Patient has all DME needed   ED Mini Assessment:             Interventions which prevented an admission or readmission: Home Health Consult or Services    Patient Contact and Communications        ,                 Admission diagnosis:  CHF Patient Active Problem List   Diagnosis Date Noted   Chronic migraine without aura, with intractable migraine, so stated, with status migrainosus 05/30/2022   Acute pain of right knee 12/20/2018   Ganglion 05/26/2017   Paronychia of right index finger 05/13/2017   Avulsion fracture of right ankle 03/29/2014   Right shoulder pain 12/05/2013   Sprain of right ankle 12/05/2013   Migraine    Endometriosis    IBS (irritable bowel syndrome)    PCP:  Duanne Butler DASEN, MD Pharmacy:   [id number] - [place]), [place] - [address] [place] ([place]) [place] [address] Phone: [phone] Fax: [phone]

## 2023-08-22 DIAGNOSIS — E669 Obesity, unspecified: Secondary | ICD-10-CM

## 2023-08-22 DIAGNOSIS — R2232 Localized swelling, mass and lump, left upper limb: Secondary | ICD-10-CM

## 2023-08-22 DIAGNOSIS — R2242 Localized swelling, mass and lump, left lower limb: Secondary | ICD-10-CM

## 2023-08-22 LAB — CBC
HCT: 43 % (ref 36.0–46.0)
Hemoglobin: 14.3 g/dL (ref 12.0–15.0)
MCH: 32.5 pg (ref 26.0–34.0)
MCHC: 33.3 g/dL (ref 30.0–36.0)
MCV: 97.7 fL (ref 80.0–100.0)
Platelets: 205 K/uL (ref 150–400)
RBC: 4.4 MIL/uL (ref 3.87–5.11)
RDW: 14.8 % (ref 11.5–15.5)
WBC: 9.1 K/uL (ref 4.0–10.5)
nRBC: 0 % (ref 0.0–0.2)

## 2023-08-22 LAB — BASIC METABOLIC PANEL WITH GFR
Anion gap: 14 (ref 5–15)
BUN: 18 mg/dL (ref 8–23)
CO2: 24 mmol/L (ref 22–32)
Calcium: 9.1 mg/dL (ref 8.9–10.3)
Chloride: 100 mmol/L (ref 98–111)
Creatinine, Ser: 0.92 mg/dL (ref 0.44–1.00)
GFR, Estimated: >60 mL/min (ref >60)
Glucose, Bld: 101 mg/dL — ABNORMAL HIGH (ref 70–99)
Potassium: 3.5 mmol/L (ref 3.5–5.1)
Sodium: 138 mmol/L (ref 135–145)

## 2023-08-22 NOTE — Progress Notes (Signed)
 SLP Cancellation Note  Patient Details Name: Deborah Jennings MRN: [id number] DOB: 01/21/1961   Cancelled treatment:       Reason Eval/Treat Not Completed: SLP screened, no needs identified, will sign off; pt at baseline for cognitive linguistics. MRI of head unremarkable for acute intracranial abnormalities  Mitzie HUNT MA, CCC-SLP Acute Rehabilitation Services   08/22/2023, 12:42 PM

## 2023-08-22 NOTE — Plan of Care (Signed)

## 2023-08-22 NOTE — Evaluation (Signed)
 Occupational Therapy Evaluation Patient Details Name: Deborah Jennings MRN: [id number] DOB: April 22, 1960 Today's Date: 08/22/2023   History of Present Illness   63 yo F arrived via EMS 7/18 after falling. Complaints of left arm and left leg swelling and pain over last 6 months that have contributed to fall. MRI negative for stroke. 08/21/23 CT  imaging is unremarkable without obvious clot or abnormality on the left side of the body which would result in her swelling. PMH: none official in chart. Pt reports chronic headache, and resection of 2 brain tumors.     Clinical Impressions Pt admitted for above, PTA pt reports being grossly ind with ADLs but sponge bathing at baseline secondary to challenges getting in shower, her husband assists with iADLs. Pt notes numerous falls recently, increased challenges with ambulation but does not use DME. Pt L side remains weak with increased edema throughout. She declined mobility at this time secondary to pain, transition to edema management through the use of MEM and positioning. MEM making slight improvements with edema during session. Unable to remove her rings from Lt hand today, notified RN as they are constricting blood flow. OT to continue following pt acutely to progress pt as able with L sided deficits and balance. Recommend outpt OT follow-up.      If plan is discharge home, recommend the following:   A little help with bathing/dressing/bathroom;Assistance with cooking/housework;Assist for transportation     Functional Status Assessment   Patient has had a recent decline in their functional status and demonstrates the ability to make significant improvements in function in a reasonable and predictable amount of time.     Equipment Recommendations   Tub/shower bench (seat if not able to procure bench)     Recommendations for Other Services         Precautions/Restrictions   Precautions Precautions: Fall Precaution/Restrictions  Comments: numerous fall prior to coming to ED Restrictions Weight Bearing Restrictions Per Provider Order: No     Mobility Bed Mobility               General bed mobility comments: Pt declined 2/2 pain    Transfers                          Balance                                           ADL either performed or assessed with clinical judgement   ADL Overall ADL's : Needs assistance/impaired Eating/Feeding: Bed level;Independent   Grooming: Set up;Bed level   Upper Body Bathing: Bed level;Minimal assistance       Upper Body Dressing : Moderate assistance                     General ADL Comments: Pt declined mobility 2/2 back pain. Focused rest of session on edema managent and UE assessment     Vision Patient Visual Report: No change from baseline Vision Assessment?: No apparent visual deficits     Perception         Praxis         Pertinent Vitals/Pain Pain Assessment Pain Assessment: Faces Faces Pain Scale: Hurts whole lot Pain Location: L shoudler with ROM, back. back hurts more Pain Descriptors / Indicators: Sore, Grimacing, Guarding, Aching, Discomfort Pain Intervention(s): Limited activity within patient's tolerance, Monitored  during session, Repositioned, Other (comment) (pt already resting on ice packs.)     Extremity/Trunk Assessment Upper Extremity Assessment Upper Extremity Assessment: Right hand dominant;LUE deficits/detail LUE Deficits / Details: Increased edema in hand and distal forearm, weak gross grasp and pt unable to make full composite fist. Shoulder flexion AROM ~90 deg with 3/5MMT. Elbow flex/ext 4/5. Rings on her fingers are constricting, noted some purplish discoloration at site of rings. LUE: Shoulder pain with ROM LUE Sensation: decreased light touch (distal>proximal) LUE Coordination: decreased fine motor   Lower Extremity Assessment LLE Deficits / Details: increased edema limiting end  range ROM in hip, knee and ankle, especially with dorsiflexion, strength grossly 3/5 LLE Sensation: decreased light touch LLE Coordination: decreased fine motor   Cervical / Trunk Assessment Cervical / Trunk Assessment: Kyphotic   Communication Communication Communication: No apparent difficulties   Cognition Arousal: Alert Behavior During Therapy: WFL for tasks assessed/performed Cognition: No apparent impairments                               Following commands: Intact       Cueing  General Comments   Cueing Techniques: Verbal cues;Gestural cues;Tactile cues  Messaged RN regarding rings on L hand constricting blood flow. LUE/LLE elevated on pillows at end of session.   Exercises Other Exercises Other Exercises: MEM LUE to promote lymphatic drainage of fluid Other Exercises: yellow theraputty squeezes LUE.   Shoulder Instructions      Home Living Family/patient expects to be discharged to:: Private residence Living Arrangements: Spouse/significant other Available Help at Discharge: Available 24 hours/day Type of Home: House Home Access: Stairs to enter [employer] of Steps: 3 Entrance Stairs-Rails: Can reach both Home Layout: One level     Bathroom Shower/Tub: Chief Strategy Officer: Standard Bathroom Accessibility: No   Home Equipment: Agricultural consultant (2 wheels);BSC/3in1          Prior Functioning/Environment Prior Level of Function : Needs assist       Physical Assist : ADLs (physical)     Mobility Comments: increasing difficulty with ambulation without AD, numerous falls ADLs Comments: independent with ADLs but has been taking bird baths due to increased L sided weakness. husband assists with iADLs    OT Problem List: Decreased strength;Decreased range of motion;Pain;Impaired balance (sitting and/or standing)   OT Treatment/Interventions: Self-care/ADL training;Therapeutic exercise;Balance training;Patient/family  education;Therapeutic activities;DME and/or AE instruction      OT Goals(Current goals can be found in the care plan section)   Acute Rehab OT Goals Patient Stated Goal: To reduce swelling, get rings cut off OT Goal Formulation: With patient Time For Goal Achievement: 09/05/23 Potential to Achieve Goals: Good   OT Frequency:  Min 2X/week    Co-evaluation              AM-PAC OT 6 Clicks Daily Activity     Outcome Measure Help from another person eating meals?: None Help from another person taking care of personal grooming?: A Little Help from another person toileting, which includes using toliet, bedpan, or urinal?: A Little (not fully assessed) Help from another person bathing (including washing, rinsing, drying)?: A Little Help from another person to put on and taking off regular upper body clothing?: A Lot Help from another person to put on and taking off regular lower body clothing?: A Little (not fully assessed) 6 Click Score: 18   End of Session Nurse Communication: Mobility status  Activity Tolerance: Patient limited by pain Patient left: in bed;with call bell/phone within reach  OT Visit Diagnosis: Muscle weakness (generalized) (M62.81);Other abnormalities of gait and mobility (R26.89);Unsteadiness on feet (R26.81);Pain Pain - Right/Left: Left Pain - part of body: Shoulder (back)                Time: 8865-8799 OT Time Calculation (min): 26 min Charges:  OT General Charges $OT Visit: 1 Visit OT Evaluation $OT Eval Low Complexity: 1 Low OT Treatments $Massage: 8-22 mins  08/22/2023  AB, OTR/L  Acute Rehabilitation Services  Office: [phone]   Curtistine JONETTA Das 08/22/2023, 4:35 PM

## 2023-08-22 NOTE — Progress Notes (Signed)
 Mobility Specialist: Progress Note   08/22/23 1417  Mobility  Activity Ambulated with assistance in hallway  Level of Assistance Contact guard assist, steadying assist  Assistive Device Front wheel walker  Distance Ambulated (ft) 45 ft  Activity Response Tolerated well  Mobility Referral Yes  Mobility visit 1 Mobility  Mobility Specialist Start Time (ACUTE ONLY) [id number]  Mobility Specialist Stop Time (ACUTE ONLY) 0941  Mobility Specialist Time Calculation (min) (ACUTE ONLY) 15 min    Pt received in bed, pleasant and agreeable to mobility session. MinA for bed mobility to assist with trunk elevation. Light MinA for STS, CGA for ambulation. Ambulated out to the hallway and back. Distance limited by fatigue. Returned to room and agreed to sit in the chair for awhile. Left in chair with legs elevated, all needs met, call bell in reach.   Ileana Lute Mobility Specialist Please contact via SecureChat or Rehab office at [phone]

## 2023-08-22 NOTE — Progress Notes (Signed)
 Progress Note   Patient: Deborah Jennings FMW:[id number] DOB: 24-Feb-1960 DOA: 08/20/2023     1 DOS: the patient was seen and examined on 08/22/2023   Brief hospital course: Deborah Jennings is a 63 y.o. female with medical history significant of migraine, IBS, chronic lower back pain brought by EMS from home for evaluation after fall.  She has left sided weakness, left upper and lower extremity swelling for last several months.   Assessment and Plan: Left-sided weakness and painful swelling: CT head, CT venogram abdomen pelvis and bilateral lower extremity, MRI brain, CT chest with contrast all resulted negative for any acute findings/DVT or stroke.  Will get Doppler ultrasound of left upper and lower extremity. PT/OT/ SLP evaluation. Possible etiologies include lymphedema, chronic venous stasis, malignancy related. May need to consider IR consult for lymphangiogram.   Recurrent falls: Balance issues: Chronic back pain/lumbar radiculopathy: Consult PT/OT.  Continue gabapentin    Chronic migraine:  On Maxalt   and Nurtec as needed at home.  Followed by neurology outpatient.   Hypercholesteremia: Continue statin   GERD: Continue PPI   IBS: Continue Bentyl    Anxiety: Continue Lexapro    Well-controlled type 2 diabetes:  Last A1c checked was 6.3 on 05/2023   Dyspnea on exertion: Home dose Lasix  will be resumed. Evaluated by cardiology back in July 2024.  On Lasix  40 twice daily per cardiology note Echocardiogram done on 02/26/2022 showed preserved ejection fraction, no wall motion abnormality.        Out of bed to chair. Incentive spirometry. Nursing supportive care. Fall, aspiration precautions. Diet:  Diet Orders (From admission, onward)     Start     Ordered   08/21/23 1140  Diet Heart Room service appropriate? Yes; Fluid consistency: Thin  Diet effective now       Question Answer Comment  Room service appropriate? Yes   Fluid consistency: Thin      08/21/23 1140            DVT prophylaxis: enoxaparin  (LOVENOX ) injection 40 mg Start: 08/21/23 1800 SCDs Start: 08/21/23 1139  Level of care: Telemetry Medical   Code Status: Full Code  Subjective: Patient is seen and examined today morning. States upper extremity swelling improved. Lower extremity swelling persists. Has on and off for several months. Feels weakness left side better, able to get out of bed.  Physical Exam: Vitals:   08/21/23 2350 08/22/23 0444 08/22/23 0802 08/22/23 1145  BP: (!) 142/79 (!) 143/74 (!) 144/76 (!) 151/97  Pulse: 89 76 72 74  Resp: 18 17    Temp: 98.7 F (37.1 C) 98 F (36.7 C) 97.8 F (36.6 C) 98.3 F (36.8 C)  TempSrc:   Oral   SpO2: 94% 96% 97% 98%    General - Elderly Caucasian female, no apparent distress HEENT - PERRLA, EOMI, atraumatic head, non tender sinuses. Lung - Clear, basal rales, no rhonchi, wheezes. Heart - S1, S2 heard, no murmurs, rubs, left leg edema. Left arm swelling improved. Abdomen - Soft, non tender, bowel sounds good Neuro - Alert, awake and oriented x 3, non focal exam. Skin - Warm and dry. Left leg edema noted.  Data Reviewed:      Latest Ref Rng & Units 08/22/2023    3:19 AM 08/21/2023    2:25 PM 08/20/2023   11:14 PM  CBC  WBC 4.0 - 10.5 K/uL 9.1  7.9    Hemoglobin 12.0 - 15.0 g/dL 85.6  86.2  85.3   Hematocrit 36.0 - 46.0 % 43.0  41.7  43.0   Platelets 150 - 400 K/uL 205  242        Latest Ref Rng & Units 08/22/2023    3:19 AM 08/21/2023    2:25 PM 08/20/2023   11:14 PM  BMP  Glucose 70 - 99 mg/dL 898   99   BUN 8 - 23 mg/dL 18   6   Creatinine 9.55 - 1.00 mg/dL 9.07  9.22  9.19   Sodium 135 - 145 mmol/L 138   142   Potassium 3.5 - 5.1 mmol/L 3.5   3.5   Chloride 98 - 111 mmol/L 100   105   CO2 22 - 32 mmol/L 24     Calcium  8.9 - 10.3 mg/dL 9.1      MR BRAIN WO CONTRAST Result Date: 08/21/2023 CLINICAL DATA:  Provided history: Neuro deficit, acute, stroke suspected. EXAM: MRI HEAD WITHOUT CONTRAST TECHNIQUE:  Multiplanar, multiecho pulse sequences of the brain and surrounding structures were obtained without intravenous contrast. COMPARISON:  Head CT 08/21/2023.  Brain MRI 11/16/2022. FINDINGS: Intermittently motion degraded examination (with up to moderate motion degradation of the acquired sequences). Brain: Mild generalized cerebral atrophy. Mild multifocal T2 FLAIR hyperintense signal abnormality within the cerebral white matter, nonspecific but most often secondary to chronic small vessel ischemia. No cortical encephalomalacia is identified. There is no acute infarct. No evidence of an intracranial mass. No chronic intracranial blood products. No extra-axial fluid collection. No midline shift. Vascular: Maintained flow voids within the proximal large arterial vessels. Skull and upper cervical spine: No focal worrisome marrow lesion. Sinuses/Orbits: No mass or acute finding within the imaged orbits. No significant paranasal sinus disease. IMPRESSION: 1. Intermittently motion degraded examination. 2. No evidence of an acute intracranial abnormality. The diffusion-weighted imaging is of good quality and there is no evidence of an acute infarct. 3. Mild multifocal T2 FLAIR hyperintense signal abnormality within the cerebral white matter, nonspecific but most often secondary to chronic small vessel ischemia. These findings are similar to the prior MRI of 11/16/2022. 4. Mild generalized cerebral atrophy. Electronically Signed   By: Rockey Childs D.O.   On: 08/21/2023 10:18   CT VENOGRAM ABD/PELVIS/LOWER EXT BILAT Result Date: 08/21/2023 EXAM: CTA ABDOMEN AND PELVIS WITH CONTRAST AND RUNOFF CTA OF THE LOWER EXTREMITIES WITH CONTRAST 08/21/2023 12:57:40 AM TECHNIQUE: CTA images of the abdomen, pelvis and lower extremities with intravenous contrast. Three-dimensional MIP/volume rendered formations were performed. Automated exposure control, iterative reconstruction, and/or weight based adjustment of the mA/kV was utilized  to reduce the radiation dose to as low as reasonably achievable. COMPARISON: None available. CLINICAL HISTORY: Deep venous thrombosis (DVT); Lymphadenopathy, groin. FINDINGS: VASCULATURE: No evidence of venous thrombosis to the level of the popliteal veins. AORTA: No acute finding. No abdominal aortic aneurysm. No dissection. CELIAC TRUNK: No acute finding. No occlusion or significant stenosis. SUPERIOR MESENTERIC ARTERY: No acute finding. No occlusion or significant stenosis. INFERIOR MESENTERIC ARTERY: No acute finding. No occlusion or significant stenosis. RENAL ARTERIES: No acute finding. No occlusion or significant stenosis. RIGHT ILIAC ARTERIES: No acute finding. No occlusion or significant stenosis. RIGHT FEMORAL ARTERIES: No acute finding. No occlusion or significant stenosis. RIGHT POPLITEAL ARTERY: No acute finding. No occlusion or significant stenosis. RIGHT CALF ARTERIES: No acute finding. No occlusion or significant stenosis. LEFT ILIAC ARTERIES: No acute finding. No occlusion or significant stenosis. LEFT FEMORAL ARTERIES: No acute finding. No occlusion or significant stenosis. LEFT POPLITEAL ARTERY: No acute finding. No occlusion or significant stenosis. LEFT CALF ARTERIES: No  acute finding. No occlusion or significant stenosis. ABDOMEN AND PELVIS: LOWER CHEST: Visualized portion of the lower chest demonstrates no acute abnormality. LIVER: The liver is unremarkable. GALLBLADDER AND BILE DUCTS: Gallbladder is unremarkable. No biliary ductal dilatation. SPLEEN: The spleen is unremarkable. PANCREAS: The pancreas is unremarkable. ADRENAL GLANDS: Bilateral adrenal glands demonstrate no acute abnormality. KIDNEYS, URETERS AND BLADDER: No stones in the kidneys or ureters. No hydronephrosis. No evidence of perinephric or periureteral stranding. Urinary bladder is unremarkable. GI AND BOWEL: Stomach and duodenal sweep demonstrate no acute abnormality. There is no bowel obstruction. No abnormal bowel wall  thickening or distension. Normal appendix (image 62). Mild sigmoid diverticulosis, without evidence of diverticulitis. REPRODUCTIVE: Status post hysterectomy. PERITONEUM AND RETROPERITONEUM: No ascites or free air. LYMPH NODES: No evidence of lymphadenopathy. BONES AND SOFT TISSUES: Mild degenerative changes at L5-S1. Mild subcutaneous stranding/fluid along the left lateral thigh (image 128) and extending inferiorly to the left lateral upper calf (image 163). IMPRESSION: 1. No evidence of venous thrombosis to the level of the popliteal veins. 2. Mild subcutaneous stranding/fluid along the left lateral thigh and extending inferiorly to the left lateral upper calf. 3. Mild sigmoid diverticulosis, without evidence of diverticulitis. Electronically signed by: Pinkie Pebbles MD 08/21/2023 01:12 AM EDT RP Workstation: [id number]   CT CHEST W CONTRAST Result Date: 08/21/2023 EXAM: CT CHEST WITH CONTRAST 08/21/2023 12:57:40 AM TECHNIQUE: CT of the chest was performed with the administration of intravenous contrast. Multiplanar reformatted images are provided for review. Automated exposure control, iterative reconstruction, and/or weight based adjustment of the mA/kV was utilized to reduce the radiation dose to as low as reasonably achievable. COMPARISON: None available. CLINICAL HISTORY: Lymphadenopathy. FINDINGS: MEDIASTINUM: Heart and pericardium are unremarkable. The central airways are clear. LYMPH NODES: No suspicious mediastinal, hilar, or axillary lymphadenopathy. 8 mm short axis left axillary node (61), within normal limits. LUNGS AND PLEURA: Mild bibasilar atelectasis. No focal consolidation or pulmonary edema. No pleural effusion or pneumothorax. SOFT TISSUES/BONES: No acute abnormality of the bones or soft tissues. UPPER ABDOMEN: Limited images of the upper abdomen demonstrates no acute abnormality. IMPRESSION: 1. Negative CT chest. Electronically signed by: Pinkie Pebbles MD 08/21/2023 01:08 AM EDT RP  Workstation: [id number]   CT Head Wo Contrast Result Date: 08/21/2023 EXAM: CT HEAD WITHOUT CONTRAST 08/21/2023 12:57:40 AM TECHNIQUE: CT of the head was performed without the administration of intravenous contrast. Automated exposure control, iterative reconstruction, and/or weight based adjustment of the mA/kV was utilized to reduce the radiation dose to as low as reasonably achievable. COMPARISON: None available. CLINICAL HISTORY: Vertigo, central. FINDINGS: BRAIN AND VENTRICLES: No acute hemorrhage. Gray-white differentiation is preserved. No hydrocephalus. No extra-axial collection. No mass effect or midline shift. ORBITS: No acute abnormality. SINUSES: No acute abnormality. SOFT TISSUES AND SKULL: No acute soft tissue abnormality. No skull fracture. IMPRESSION: 1. No acute intracranial abnormality. Electronically signed by: Franky Stanford MD 08/21/2023 01:01 AM EDT RP Workstation: [id number]   DG Chest Port 1 View Result Date: 08/20/2023 CLINICAL DATA:  Fatigue. EXAM: PORTABLE CHEST 1 VIEW COMPARISON:  01/06/2021 FINDINGS: Patient is rotated. Stable heart size and mediastinal contours. Question of lingular opacity versus prominent epicardial fat pad. No pulmonary edema, pleural fluid or pneumothorax. IMPRESSION: Question of lingular opacity versus prominent epicardial fat pad. Electronically Signed   By: Andrea Gasman M.D.   On: 08/20/2023 21:35    Family Communication: Discussed with patient, understand and agree. All questions answered.  Disposition: Status is: Inpatient Remains inpatient appropriate because: work up of unilateral limb swelling.  Planned Discharge Destination: Home with Home Health     Time spent: 42 minutes  Author: Concepcion Riser, MD 08/22/2023 1:31 PM Secure chat 7am to 7pm For on call review [email or web address].

## 2023-08-23 ENCOUNTER — Ambulatory Visit: Payer: PRIVATE HEALTH INSURANCE | Admitting: Adult Health

## 2023-08-23 ENCOUNTER — Inpatient Hospital Stay (HOSPITAL_COMMUNITY): Payer: Self-pay

## 2023-08-23 DIAGNOSIS — L8990 Pressure ulcer of unspecified site, unspecified stage: Secondary | ICD-10-CM | POA: Insufficient documentation

## 2023-08-23 DIAGNOSIS — R2232 Localized swelling, mass and lump, left upper limb: Secondary | ICD-10-CM

## 2023-08-23 DIAGNOSIS — R2242 Localized swelling, mass and lump, left lower limb: Secondary | ICD-10-CM

## 2023-08-23 DIAGNOSIS — E669 Obesity, unspecified: Secondary | ICD-10-CM

## 2023-08-23 DIAGNOSIS — M7989 Other specified soft tissue disorders: Secondary | ICD-10-CM

## 2023-08-23 LAB — C-REACTIVE PROTEIN: CRP: 1.3 mg/dL — ABNORMAL HIGH (ref <1.0)

## 2023-08-23 LAB — SEDIMENTATION RATE: Sed Rate: 16 mm/h (ref 0–22)

## 2023-08-23 MED ORDER — ALPRAZOLAM 0.5 MG PO TABS: 0.5000 mg | ORAL_TABLET | Freq: Every evening | ORAL | Status: DC | PRN

## 2023-08-23 MED ORDER — ALPRAZOLAM 0.5 MG PO TABS
0.5000 mg | ORAL_TABLET | Freq: Three times a day (TID) | ORAL | Status: DC | PRN
  Administered 2023-08-23 – 2023-08-25 (×3): 0.5 mg via ORAL
  Filled 2023-08-23 (×3): qty 1

## 2023-08-23 NOTE — Plan of Care (Signed)
  Problem: Education: Goal: Knowledge of General Education information will improve Description: Including pain rating scale, medication(s)/side effects and non-pharmacologic comfort measures Outcome: Progressing   Problem: Activity: Goal: Risk for activity intolerance will decrease Outcome: Progressing   Problem: Health Behavior/Discharge Planning: Goal: Ability to manage health-related needs will improve Outcome: Not Progressing   Problem: Coping: Goal: Level of anxiety will decrease Outcome: Not Progressing   Problem: Pain Managment: Goal: General experience of comfort will improve and/or be controlled Outcome: Not Progressing

## 2023-08-23 NOTE — Progress Notes (Signed)
 OT Cancellation Note  Patient Details Name: Fred Hammes MRN: [id number] DOB: Oct 08, 1960   Cancelled Treatment:    Reason Eval/Treat Not Completed: Patient declined, no reason specified (Patient declined OT this am due to complaints of pain and lack of sleep, acute OT to attempt again today as schedule permits)  Jeb LITTIE Laine 08/23/2023, 9:29 AM  Dick Laine, OTA Acute Rehabilitation Services  Office [phone]

## 2023-08-23 NOTE — Progress Notes (Signed)
 LUE venous exam is completed. Trudy Kory, RVT

## 2023-08-23 NOTE — Progress Notes (Signed)
 Progress Note   Patient: Deborah Jennings FMW:[id number] DOB: [birth year] DOA: 08/20/2023     2 DOS: the patient was seen and examined on 08/23/2023   Brief hospital course: Thersia Jennings is a 63 y.o. female with medical history significant of migraine, IBS, chronic lower back pain brought by EMS from home for evaluation after fall.  She has left sided weakness, left upper and lower extremity swelling for last several months.   Assessment and Plan: Left-sided weakness and painful swelling: CT head, CT venogram abdomen pelvis and bilateral lower extremity, MRI brain, CT chest with contrast all resulted negative for any acute findings/DVT or stroke. Doppler ultrasound of left upper and lower extremity negative for DVT. Total Protein, albumin normal, ECHO from last year unremarkable. Discussed with neurology who do not think the symptoms related to neurological disorder. Left upper extremity welling better, lower extremity swelling persist, no pain though. Able to get out of bed. Possible etiologies include lymphedema, chronic venous stasis, ? malignancy related.  ESR, CRP ordered. Echo pending. IR lymphangiogram left lower extremity ordered. PT/ OT follow up for dc needs.  Dyspnea on exertion: Home dose Lasix  resumed. Evaluated by cardiology back in July 2024.  On Lasix  40 twice daily per cardiology note Echocardiogram done on 02/26/2022 showed preserved ejection fraction, no wall motion abnormality.  Repeat Echo ordered.   Recurrent falls: Balance issues: Chronic back pain/lumbar radiculopathy: Has sciatic pain. Advised cold compression.  Continue gabapentin . PT/ OT evaluation.   Chronic migraine:  On Maxalt   and Nurtec as needed at home.  Followed by neurology outpatient.   Hypercholesteremia: Continue statin   GERD: Continue PPI   IBS: Continue Bentyl    Anxiety: Continue Lexapro    Well-controlled type 2 diabetes:  Last A1c checked was 6.3 on 05/2023.      Out of bed to  chair. Incentive spirometry. Nursing supportive care. Fall, aspiration precautions. Diet:  Diet Orders (From admission, onward)     Start     Ordered   08/21/23 1140  Diet Heart Room service appropriate? Yes; Fluid consistency: Thin  Diet effective now       Question Answer Comment  Room service appropriate? Yes   Fluid consistency: Thin      08/21/23 1140           DVT prophylaxis: enoxaparin  (LOVENOX ) injection 40 mg Start: 08/21/23 1800 SCDs Start: 08/21/23 1139  Level of care: Telemetry Medical   Code Status: Full Code  Subjective: Patient is seen and examined today morning. States upper extremity swelling improved. Lower extremity swelling persists. Has on and off for several months. Feels weakness left side better, able to get out of bed.  Physical Exam: Vitals:   08/22/23 2001 08/22/23 2357 08/23/23 0745 08/23/23 1542  BP: (!) 147/92 (!) 153/91 (!) 147/101 118/77  Pulse: 73 83 87 78  Resp:   19   Temp: 97.8 F (36.6 C) 97.8 F (36.6 C) 98.9 F (37.2 C) 98 F (36.7 C)  TempSrc:    Oral  SpO2: 98% 95% 93% 96%    General - Elderly Caucasian female, no apparent distress HEENT - PERRLA, EOMI, atraumatic head, non tender sinuses. Lung - Clear, basal rales, no rhonchi, wheezes. Heart - S1, S2 heard, no murmurs, rubs, left leg edema. Left arm swelling improved. Abdomen - Soft, non tender, bowel sounds good Neuro - Alert, awake and oriented x 3, non focal exam. Skin - Warm and dry. Left leg edema noted.  Data Reviewed:  Latest Ref Rng & Units 08/22/2023    3:19 AM 08/21/2023    2:25 PM 08/20/2023   11:14 PM  CBC  WBC 4.0 - 10.5 K/uL 9.1  7.9    Hemoglobin 12.0 - 15.0 g/dL 85.6  86.2  85.3   Hematocrit 36.0 - 46.0 % 43.0  41.7  43.0   Platelets 150 - 400 K/uL 205  242        Latest Ref Rng & Units 08/22/2023    3:19 AM 08/21/2023    2:25 PM 08/20/2023   11:14 PM  BMP  Glucose 70 - 99 mg/dL 898   99   BUN 8 - 23 mg/dL 18   6   Creatinine 9.55 - 1.00  mg/dL 9.07  9.22  9.19   Sodium 135 - 145 mmol/L 138   142   Potassium 3.5 - 5.1 mmol/L 3.5   3.5   Chloride 98 - 111 mmol/L 100   105   CO2 22 - 32 mmol/L 24     Calcium  8.9 - 10.3 mg/dL 9.1      VAS US  LOWER EXTREMITY VENOUS (DVT) Result Date: 08/23/2023  Lower Venous DVT Study Patient Name:  Deborah Jennings  Date of Exam:   08/23/2023 Medical Rec #: [id number]      Accession #:    [id number] Date of Birth: 1960-12-23     Patient Gender: F Patient Age:   69 years Exam Location:  [facility] Procedure:      VAS US  LOWER EXTREMITY VENOUS (DVT) Referring Phys: CONCEPCION RISER --------------------------------------------------------------------------------  Indications: Swelling, and Edema.  Performing Technologist: Elmarie Lindau, RVT  Examination Guidelines: A complete evaluation includes B-mode imaging, spectral Doppler, color Doppler, and power Doppler as needed of all accessible portions of each vessel. Bilateral testing is considered an integral part of a complete examination. Limited examinations for reoccurring indications may be performed as noted. The reflux portion of the exam is performed with the patient in reverse Trendelenburg.  +---------+---------------+---------+-----------+----------+--------------+ LEFT     CompressibilityPhasicitySpontaneityPropertiesThrombus Aging +---------+---------------+---------+-----------+----------+--------------+ CFV      Full           Yes      Yes                                 +---------+---------------+---------+-----------+----------+--------------+ SFJ      Full                                                        +---------+---------------+---------+-----------+----------+--------------+ FV Prox  Full                                                        +---------+---------------+---------+-----------+----------+--------------+ FV Mid   Full                                                         +---------+---------------+---------+-----------+----------+--------------+ FV DistalFull                                                        +---------+---------------+---------+-----------+----------+--------------+  PFV      Full                                                        +---------+---------------+---------+-----------+----------+--------------+ POP      Full           Yes      Yes                                 +---------+---------------+---------+-----------+----------+--------------+ PTV      Full                                                        +---------+---------------+---------+-----------+----------+--------------+ PERO     Full                                                        +---------+---------------+---------+-----------+----------+--------------+     Summary: LEFT: - There is no evidence of deep vein thrombosis in the lower extremity.  - No cystic structure found in the popliteal fossa.  *See table(s) above for measurements and observations.    Preliminary    VAS US  UPPER EXTREMITY VENOUS DUPLEX Result Date: 08/23/2023 UPPER VENOUS STUDY  Patient Name:  KIMARIE COOR  Date of Exam:   08/23/2023 Medical Rec #: [id number]      Accession #:    [id number] Date of Birth: [birth year]     Patient Gender: F Patient Age:   68 years Exam Location:  [facility] Procedure:      VAS US  UPPER EXTREMITY VENOUS DUPLEX Referring Phys: VELNA SKEETER --------------------------------------------------------------------------------  Indications: Swelling, and Pain Performing Technologist: Elmarie Lindau, RVT  Examination Guidelines: A complete evaluation includes B-mode imaging, spectral Doppler, color Doppler, and power Doppler as needed of all accessible portions of each vessel. Bilateral testing is considered an integral part of a complete examination. Limited examinations for reoccurring indications may be performed as noted.  Right Findings:  +----------+------------+---------+-----------+----------+-------+ RIGHT     CompressiblePhasicitySpontaneousPropertiesSummary +----------+------------+---------+-----------+----------+-------+ Subclavian    Full                                          +----------+------------+---------+-----------+----------+-------+  Left Findings: +----------+------------+---------+-----------+----------+-------+ LEFT      CompressiblePhasicitySpontaneousPropertiesSummary +----------+------------+---------+-----------+----------+-------+ IJV           Full       Yes       Yes                      +----------+------------+---------+-----------+----------+-------+ Subclavian    Full                                          +----------+------------+---------+-----------+----------+-------+ Axillary      Full                                          +----------+------------+---------+-----------+----------+-------+  Brachial      Full                                          +----------+------------+---------+-----------+----------+-------+ Radial        Full                                          +----------+------------+---------+-----------+----------+-------+ Ulnar         Full                                          +----------+------------+---------+-----------+----------+-------+ Cephalic      Full                                          +----------+------------+---------+-----------+----------+-------+ Basilic       Full                                          +----------+------------+---------+-----------+----------+-------+  Summary:  Right: No evidence of thrombosis in the subclavian.  Left: No evidence of deep vein thrombosis in the upper extremity.  *See table(s) above for measurements and observations.    Preliminary     Family Communication: Discussed with patient, understand and agree. All questions answered.  Disposition: Status  is: Inpatient Remains inpatient appropriate because: work up of unilateral limbs swelling.  Planned Discharge Destination: Home with Home Health     Time spent: 43 minutes  Author: Concepcion Riser, MD 08/23/2023 4:44 PM Secure chat 7am to 7pm For on call review [email or web address].

## 2023-08-23 NOTE — Progress Notes (Signed)
 Occupational Therapy Treatment Patient Details Name: Deborah Jennings MRN: [id number] DOB: 1961/01/10 Today's Date: 08/23/2023   History of present illness 63 yo F arrived via EMS 7/18 after falling. Complaints of left arm and left leg swelling and pain over last 6 months that have contributed to fall. MRI negative for stroke. 08/21/23 CT  imaging is unremarkable without obvious clot or abnormality on the left side of the body which would result in her swelling. PMH: none official in chart. Pt reports chronic headache, and resection of 2 brain tumors.   OT comments  Patient received in supine with complaints of back pain but agreeable to work with OT. Patient required cues and mod assist for sidelying to sitting on EOB and min assist for transfer to recliner. Patient able to perform grooming tasks seated in recliner with setup. Patient asking to use [facility] with min assist for transfer with HHA and was able to perform toilet hygiene with CGA while standing. Patient instructed in level one therapy putty hand exercises to increase gross and FMC to LUE. Discharge recommendations continue to be appropriate. Acute OT to continue to follow to address established goals.       If plan is discharge home, recommend the following:  A little help with bathing/dressing/bathroom;Assistance with cooking/housework;Assist for transportation   Equipment Recommendations  Tub/shower bench (seat if not able to procure bench)    Recommendations for Other Services      Precautions / Restrictions Precautions Precautions: Fall Precaution/Restrictions Comments: numerous fall prior to coming to ED Restrictions Weight Bearing Restrictions Per Provider Order: No       Mobility Bed Mobility Overal bed mobility: Needs Assistance Bed Mobility: Sidelying to Sit   Sidelying to sit: Mod assist, HOB elevated, Used rails       General bed mobility comments: sidelying to sit  to avoid increased back pain     Transfers Overall transfer level: Needs assistance Equipment used: Rolling walker (2 wheels) Transfers: Sit to/from Stand Sit to Stand: Min assist, From elevated surface           General transfer comment: min assist with RW to transfer from EOB to recliner, HHA for transfer to/from [facility] from/to recliner     Balance Overall balance assessment: Needs assistance Sitting-balance support: Feet supported, No upper extremity supported Sitting balance-Leahy Scale: Good Sitting balance - Comments: EOB   Standing balance support: Single extremity supported, Bilateral upper extremity supported, During functional activity Standing balance-Leahy Scale: Fair Standing balance comment: less back pain with UE support                           ADL either performed or assessed with clinical judgement   ADL Overall ADL's : Needs assistance/impaired     Grooming: Wash/dry hands;Wash/dry face;Oral care;Set up;Sitting Grooming Details (indicate cue type and reason): in recliner                 Toilet Transfer: Minimal assistance;BSC/3in1   Toileting- Clothing Manipulation and Hygiene: Contact guard assist;Sit to/from stand         General ADL Comments: back pain initially, felt better once OOB    Extremity/Trunk Assessment              Vision       Perception     Praxis     Communication Communication Communication: No apparent difficulties   Cognition Arousal: Alert Behavior During Therapy: WFL for tasks assessed/performed Cognition: No apparent impairments  Following commands: Intact        Cueing   Cueing Techniques: Verbal cues, Gestural cues, Tactile cues  Exercises Exercises: Other exercises Other Exercises Other Exercises: level one therapy putty exercises to address gross grasp and [facility] for LUE    Shoulder Instructions       General Comments      Pertinent Vitals/ Pain       Pain  Assessment Pain Assessment: Faces Faces Pain Scale: Hurts even more Pain Location: back Pain Descriptors / Indicators: Sore, Grimacing, Guarding, Aching, Discomfort Pain Intervention(s): Limited activity within patient's tolerance, Monitored during session, Premedicated before session, Repositioned, Ice applied  Home Living                                          Prior Functioning/Environment              Frequency  Min 2X/week        Progress Toward Goals  OT Goals(current goals can now be found in the care plan section)  Progress towards OT goals: Progressing toward goals  Acute Rehab OT Goals Patient Stated Goal: less pain OT Goal Formulation: With patient Time For Goal Achievement: 09/05/23 Potential to Achieve Goals: Good ADL Goals Pt Will Perform Grooming: with modified independence;standing Pt Will Perform Lower Body Dressing: with modified independence;sit to/from stand Pt Will Transfer to Toilet: with modified independence;ambulating Additional ADL Goal #1: Pt will demonstrate understanding of at least 2 edema management strategies for LUE/LLE Additional ADL Goal #3: Pt will complete an OOB assessment as a precursor to standing ADLs.  Plan      Co-evaluation                 AM-PAC OT 6 Clicks Daily Activity     Outcome Measure   Help from another person eating meals?: None Help from another person taking care of personal grooming?: A Little Help from another person toileting, which includes using toliet, bedpan, or urinal?: A Little Help from another person bathing (including washing, rinsing, drying)?: A Little Help from another person to put on and taking off regular upper body clothing?: A Lot Help from another person to put on and taking off regular lower body clothing?: A Little 6 Click Score: 18    End of Session Equipment Utilized During Treatment: Rolling walker (2 wheels)  OT Visit Diagnosis: Muscle weakness  (generalized) (M62.81);Other abnormalities of gait and mobility (R26.89);Unsteadiness on feet (R26.81);Pain Pain - part of body:  (back)   Activity Tolerance Patient tolerated treatment well;Patient limited by pain   Patient Left in chair;with call bell/phone within reach   Nurse Communication Mobility status        Time: 8852-8765 OT Time Calculation (min): 47 min  Charges: OT General Charges $OT Visit: 1 Visit OT Treatments $Self Care/Home Management : 23-37 mins $Therapeutic Exercise: 8-22 mins  Deborah Jennings, OTA Acute Rehabilitation Services  Office [phone]   Deborah Jennings 08/23/2023, 12:42 PM

## 2023-08-24 ENCOUNTER — Other Ambulatory Visit: Payer: Self-pay | Admitting: Family Medicine

## 2023-08-24 ENCOUNTER — Inpatient Hospital Stay (HOSPITAL_COMMUNITY)

## 2023-08-24 DIAGNOSIS — R0609 Other forms of dyspnea: Secondary | ICD-10-CM

## 2023-08-24 DIAGNOSIS — E7800 Pure hypercholesterolemia, unspecified: Secondary | ICD-10-CM

## 2023-08-24 LAB — ECHOCARDIOGRAM COMPLETE
AR max vel: 2.75 cm2
AV Area VTI: 2.77 cm2
AV Area mean vel: 2.45 cm2
AV Mean grad: 5 mmHg
AV Peak grad: 10.2 mmHg
Ao pk vel: 1.6 m/s
Area-P 1/2: 5.2 cm2
S' Lateral: 3.1 cm
Weight: 3760 [oz_av]

## 2023-08-24 MED ORDER — SUMATRIPTAN SUCCINATE 50 MG PO TABS
50.0000 mg | ORAL_TABLET | ORAL | Status: DC | PRN
  Administered 2023-08-24 – 2023-08-25 (×4): 50 mg via ORAL
  Filled 2023-08-24 (×5): qty 1

## 2023-08-24 NOTE — Progress Notes (Signed)
 Mobility Specialist: Progress Note   08/24/23 1325  Mobility  Activity Ambulated with assistance in hallway  Level of Assistance Contact guard assist, steadying assist  Assistive Device Front wheel walker  Distance Ambulated (ft) 55 ft  Activity Response Tolerated well  Mobility Referral Yes  Mobility visit 1 Mobility  Mobility Specialist Start Time (ACUTE ONLY) 0930  Mobility Specialist Stop Time (ACUTE ONLY) 0943  Mobility Specialist Time Calculation (min) (ACUTE ONLY) 13 min    Pt received in chair, agreeable to mobility session. SV for STS, CGA for ambulation. C/o painful swelling in LLE which ultimately limits distance with ambulation. Required min verbal cues to pick up LLE while ambulating; pt stated she tends to drag her L foot at times d/t pain. Returned to room and back to chair. Left in chair with all needs met, call bell in reach.   Ileana Lute Mobility Specialist Please contact via SecureChat or Rehab office at [phone]

## 2023-08-24 NOTE — Progress Notes (Signed)
 PT Cancellation Note  Patient Details Name: Deborah Jennings MRN: [id number] DOB: [birth year]   Cancelled Treatment:    Reason Eval/Treat Not Completed: Pain limiting ability to participate  Patient reports just back to bed and back pain/pinched nerve hurting too much to get up. Explained goal to complete stair training and pt continued to refuse due to pain. Educated by demonstrating how to go up/down steps with good vs bad leg. Pt can reach rails on both sides and will not need to use a device on stairs.   Noted pt ambulated 55 ft with RW with CGA with Mobility Specialist earlier today.   Will attempt to see 7/23 a.m. for planned discharge 7/23.    Macario RAMAN, PT Acute Rehabilitation Services  Office [phone]   Macario SHAUNNA Soja 08/24/2023, 4:02 PM

## 2023-08-24 NOTE — Plan of Care (Signed)

## 2023-08-24 NOTE — Progress Notes (Signed)
 PROGRESS NOTE  Deborah Jennings  FMW:[id number] DOB: 01-26-1961 DOA: 08/20/2023 PCP: Duanne Butler DASEN, MD   Brief Narrative: Patient is a 63 year old female with history of migraine, irritable bowel syndrome, chronic low back pain who presented from home for the evaluation of fall.  On presentation, she was complaining of left-sided weakness, left upper and lower extremity swelling for last several months.  Ambulates with the help of walker at home.  Left-sided swelling has improved, echo pending.  She still complains of weakness on the left side, MRI has been negative.  Does not feel ready to go home today.  Waiting for repeat PT evaluation  Assessment & Plan:  Principal Problem:   Left-sided weakness Active Problems:   Localized swelling of left lower extremity   Localized swelling of left upper extremity   Obesity (BMI 30-39.9)   Pressure injury of skin  Left-sided weakness/edema: Unclear etiology.  Complaint of edema on the left upper and lower extremity with left-sided weakness.  Edema has improved. CT head, CT venogram abdomen/pelvis and bilateral lower extremity, MRI of the brain, CT chest with contrast all negative.  Doppler study did not show any DVT.  Discussed with neurology who did not think the symptoms are related to neurological disorder.  TSH normal.  Unclear etiology. PT/OT evaluation requested  Dyspnea on exertion: Resumed home Lasix .  Follows with cardiology.  On Lasix  40 mg twice daily.  Echo done on 1/20 4 Showed Preserved EF, No Wall Motion Abnormality.  Repeat Echo Ordered  Recurrent falls/chronic back pain/balance issues: Has history of sciatica.  Currently on gabapentin .  PT/OT evaluation pending  Chronic migraine: Sumatriptan  prn ordered.  Follows with neurology/PCP  Hypercholesteremia: On statin  GERD: Continue PPI  History of IBS: On Bentyl   Anxiety: On Lexapro   Diabetes type 2: Currently well-controlled.  Last A1c of 6.3         DVT  prophylaxis:enoxaparin  (LOVENOX ) injection 40 mg Start: 08/21/23 1800 SCDs Start: 08/21/23 1139     Code Status: Full Code  Family Communication: None at bedside  Patient status:Inpatient  Patient is from :Home  Anticipated discharge un:Ynfz  Estimated DC date:tomorrow   Consultants: None  Procedures:None  Antimicrobials:  Anti-infectives (From admission, onward)    None       Subjective: Patient seen and examined at the bedside today.  Currently hemodynamically stable.  Lying in bed.  Alert oriented.  Complains of migraine headache today.  Her swelling on the left upper and lower extremity have improved.  He still complains of weakness on the left side.  We discussed about physical therapy evaluation.  She does not feel ready to go home today and wants to stay 1 more night for monitoring  Objective: Vitals:   08/23/23 1923 08/23/23 2101 08/24/23 0032 08/24/23 0351  BP: (!) 174/105 (!) 132/93 116/72 128/75  Pulse: 87 92 90 90  Resp: 20  17 18   Temp: 97.7 F (36.5 C)  98.2 F (36.8 C) 98.6 F (37 C)  TempSrc: Oral     SpO2: 97%  91% 90%  Weight:    106.6 kg    Intake/Output Summary (Last 24 hours) at 08/24/2023 1056 Last data filed at 08/24/2023 0630 Gross per 24 hour  Intake 480 ml  Output 2575 ml  Net -2095 ml   Filed Weights   08/24/23 0351  Weight: 106.6 kg    Examination:  General exam: Overall comfortable, not in distress,obese HEENT: PERRL Respiratory system:  no wheezes or crackles  Cardiovascular system:  S1 & S2 heard, RRR.  Gastrointestinal system: Abdomen is nondistended, soft and nontender. Central nervous system: Alert and oriented Extremities: Mild edema of left upper and lower extremities, no clubbing ,no cyanosis Skin: No rashes, no ulcers,no icterus     Data Reviewed: I have personally reviewed following labs and imaging studies  CBC: Recent Labs  Lab 08/20/23 2119 08/20/23 2314 08/21/23 1425 08/22/23 0319  WBC 7.1  --  7.9  9.1  NEUTROABS 3.8  --   --   --   HGB 15.3* 14.6 13.7 14.3  HCT 44.9 43.0 41.7 43.0  MCV 96.1  --  99.8 97.7  PLT 239  --  242 205   Basic Metabolic Panel: Recent Labs  Lab 08/20/23 2119 08/20/23 2314 08/21/23 1425 08/22/23 0319  NA 144 142  --  138  K 3.6 3.5  --  3.5  CL 104 105  --  100  CO2 24  --   --  24  GLUCOSE 84 99  --  101*  BUN 6* 6*  --  18  CREATININE 0.69 0.80 0.77 0.92  CALCIUM  9.1  --   --  9.1  MG  --   --  1.8  --   PHOS  --   --  4.6  --      No results found for this or any previous visit (from the past 240 hours).   Radiology Studies: VAS US  LOWER EXTREMITY VENOUS (DVT) Result Date: 08/23/2023  Lower Venous DVT Study Patient Name:  Deborah Jennings  Date of Exam:   08/23/2023 Medical Rec #: [id number]      Accession #:    [id number] Date of Birth: September 16, 1960     Patient Gender: F Patient Age:   54 years Exam Location:  [facility] Procedure:      VAS US  LOWER EXTREMITY VENOUS (DVT) Referring Phys: CONCEPCION RISER --------------------------------------------------------------------------------  Indications: Swelling, and Edema.  Performing Technologist: Elmarie Lindau, RVT  Examination Guidelines: A complete evaluation includes B-mode imaging, spectral Doppler, color Doppler, and power Doppler as needed of all accessible portions of each vessel. Bilateral testing is considered an integral part of a complete examination. Limited examinations for reoccurring indications may be performed as noted. The reflux portion of the exam is performed with the patient in reverse Trendelenburg.  +---------+---------------+---------+-----------+----------+--------------+ LEFT     CompressibilityPhasicitySpontaneityPropertiesThrombus Aging +---------+---------------+---------+-----------+----------+--------------+ CFV      Full           Yes      Yes                                 +---------+---------------+---------+-----------+----------+--------------+  SFJ      Full                                                        +---------+---------------+---------+-----------+----------+--------------+ FV Prox  Full                                                        +---------+---------------+---------+-----------+----------+--------------+ FV Mid   Full                                                        +---------+---------------+---------+-----------+----------+--------------+  FV DistalFull                                                        +---------+---------------+---------+-----------+----------+--------------+ PFV      Full                                                        +---------+---------------+---------+-----------+----------+--------------+ POP      Full           Yes      Yes                                 +---------+---------------+---------+-----------+----------+--------------+ PTV      Full                                                        +---------+---------------+---------+-----------+----------+--------------+ PERO     Full                                                        +---------+---------------+---------+-----------+----------+--------------+     Summary: LEFT: - There is no evidence of deep vein thrombosis in the lower extremity.  - No cystic structure found in the popliteal fossa.  *See table(s) above for measurements and observations. Electronically signed by Lonni Gaskins MD on 08/23/2023 at 5:22:30 PM.    Final    VAS US  UPPER EXTREMITY VENOUS DUPLEX Result Date: 08/23/2023 UPPER VENOUS STUDY  Patient Name:  Deborah Jennings  Date of Exam:   08/23/2023 Medical Rec #: [id number]      Accession #:    [id number] Date of Birth: 12/15/1960     Patient Gender: F Patient Age:   41 years Exam Location:  [facility] Procedure:      VAS US  UPPER EXTREMITY VENOUS DUPLEX Referring Phys: VELNA SKEETER  --------------------------------------------------------------------------------  Indications: Swelling, and Pain Performing Technologist: Elmarie Lindau, RVT  Examination Guidelines: A complete evaluation includes B-mode imaging, spectral Doppler, color Doppler, and power Doppler as needed of all accessible portions of each vessel. Bilateral testing is considered an integral part of a complete examination. Limited examinations for reoccurring indications may be performed as noted.  Right Findings: +----------+------------+---------+-----------+----------+-------+ RIGHT     CompressiblePhasicitySpontaneousPropertiesSummary +----------+------------+---------+-----------+----------+-------+ Subclavian    Full                                          +----------+------------+---------+-----------+----------+-------+  Left Findings: +----------+------------+---------+-----------+----------+-------+ LEFT      CompressiblePhasicitySpontaneousPropertiesSummary +----------+------------+---------+-----------+----------+-------+ IJV           Full       Yes       Yes                      +----------+------------+---------+-----------+----------+-------+  Subclavian    Full                                          +----------+------------+---------+-----------+----------+-------+ Axillary      Full                                          +----------+------------+---------+-----------+----------+-------+ Brachial      Full                                          +----------+------------+---------+-----------+----------+-------+ Radial        Full                                          +----------+------------+---------+-----------+----------+-------+ Ulnar         Full                                          +----------+------------+---------+-----------+----------+-------+ Cephalic      Full                                           +----------+------------+---------+-----------+----------+-------+ Basilic       Full                                          +----------+------------+---------+-----------+----------+-------+  Summary:  Right: No evidence of thrombosis in the subclavian.  Left: No evidence of deep vein thrombosis in the upper extremity.  *See table(s) above for measurements and observations.  Diagnosing physician: Lonni Gaskins MD Electronically signed by Lonni Gaskins MD on 08/23/2023 at 5:21:48 PM.    Final     Scheduled Meds:  enoxaparin  (LOVENOX ) injection  40 mg Subcutaneous Q24H   escitalopram   20 mg Oral Daily   furosemide   40 mg Oral BID   gabapentin   300 mg Oral TID   rosuvastatin   10 mg Oral Daily   Continuous Infusions:   LOS: 3 days   Ivonne Mustache, MD [facility] P7/22/2025, 10:56 AM

## 2023-08-25 ENCOUNTER — Other Ambulatory Visit: Payer: Self-pay | Admitting: Internal Medicine

## 2023-08-25 ENCOUNTER — Emergency Department (HOSPITAL_COMMUNITY)
Admission: EM | Admit: 2023-08-25 | Discharge: 2023-08-26 | Disposition: A | Discharge status: Home / Self Care | Payer: PRIVATE HEALTH INSURANCE | Attending: Emergency Medicine | Admitting: Emergency Medicine

## 2023-08-25 ENCOUNTER — Other Ambulatory Visit (HOSPITAL_COMMUNITY): Payer: Self-pay

## 2023-08-25 ENCOUNTER — Encounter (HOSPITAL_COMMUNITY): Payer: Self-pay

## 2023-08-25 ENCOUNTER — Other Ambulatory Visit: Payer: Self-pay

## 2023-08-25 DIAGNOSIS — M7989 Other specified soft tissue disorders: Secondary | ICD-10-CM | POA: Diagnosis present

## 2023-08-25 DIAGNOSIS — M79605 Pain in left leg: Secondary | ICD-10-CM | POA: Diagnosis not present

## 2023-08-25 DIAGNOSIS — R531 Weakness: Secondary | ICD-10-CM | POA: Insufficient documentation

## 2023-08-25 DIAGNOSIS — R600 Localized edema: Secondary | ICD-10-CM

## 2023-08-25 LAB — CBC
HCT: 44.3 % (ref 36.0–46.0)
Hemoglobin: 14.9 g/dL (ref 12.0–15.0)
MCH: 33.1 pg (ref 26.0–34.0)
MCHC: 33.6 g/dL (ref 30.0–36.0)
MCV: 98.4 fL (ref 80.0–100.0)
Platelets: 168 K/uL (ref 150–400)
RBC: 4.5 MIL/uL (ref 3.87–5.11)
RDW: 14.7 % (ref 11.5–15.5)
WBC: 7.2 K/uL (ref 4.0–10.5)
nRBC: 0 % (ref 0.0–0.2)

## 2023-08-25 LAB — BASIC METABOLIC PANEL WITH GFR
Anion gap: 16 — ABNORMAL HIGH (ref 5–15)
BUN: 16 mg/dL (ref 8–23)
CO2: 26 mmol/L (ref 22–32)
Calcium: 9.4 mg/dL (ref 8.9–10.3)
Chloride: 97 mmol/L — ABNORMAL LOW (ref 98–111)
Creatinine, Ser: 0.85 mg/dL (ref 0.44–1.00)
GFR, Estimated: >60 mL/min (ref >60)
Glucose, Bld: 108 mg/dL — ABNORMAL HIGH (ref 70–99)
Potassium: 4 mmol/L (ref 3.5–5.1)
Sodium: 139 mmol/L (ref 135–145)

## 2023-08-25 MED ORDER — SUMATRIPTAN SUCCINATE 50 MG PO TABS: 50.0000 mg | ORAL_TABLET | ORAL | 0 refills | Status: AC | PRN

## 2023-08-25 MED ORDER — SUMATRIPTAN SUCCINATE 50 MG PO TABS
50.0000 mg | ORAL_TABLET | ORAL | 0 refills | Status: DC | PRN
  Filled 2023-08-25: qty 20, 2d supply, fill #0

## 2023-08-25 MED ORDER — OXYCODONE HCL 5 MG PO TABS
5.0000 mg | ORAL_TABLET | Freq: Four times a day (QID) | ORAL | 0 refills | Status: DC | PRN
  Filled 2023-08-25: qty 10, 3d supply, fill #0

## 2023-08-25 MED ORDER — FUROSEMIDE 40 MG PO TABS: 40.0000 mg | ORAL_TABLET | Freq: Every day | ORAL | Status: AC

## 2023-08-25 MED ORDER — OXYCODONE HCL 5 MG PO TABS: 5.0000 mg | ORAL_TABLET | Freq: Four times a day (QID) | ORAL | 0 refills | Status: AC | PRN

## 2023-08-25 NOTE — TOC Transition Note (Addendum)
 Transition of Care [facility]) - Discharge Note   Patient Details  Name: Deborah Jennings MRN: [id number] Date of Birth: July 11, 1960  [facility] CM/SW Contact:  Rosaline JONELLE Joe, RN Phone Number: 08/25/2023, 12:23 PM   Clinical Narrative:    Patient was seen by PT today and is cleared for discharge to home.  No other IP Care management needs at this time.  Discharge RN will assist sending patient home today.  OPPT follow up noted in the AVS.         Patient Goals and CMS Choice            Discharge Placement                       Discharge Plan and Services Additional resources added to the After Visit Summary for                                       Social Drivers of Health (SDOH) Interventions SDOH Screenings   Food Insecurity: Food Insecurity Present (08/21/2023)  Housing: High Risk (08/21/2023)  Transportation Needs: Unmet Transportation Needs (08/21/2023)  Utilities: Not At Risk (08/21/2023)  Alcohol Screen: Low Risk  (03/21/2020)  Depression (PHQ2-9): Medium Risk (12/16/2021)  Tobacco Use: Medium Risk (08/21/2023)     Readmission Risk Interventions     No data to display

## 2023-08-25 NOTE — Progress Notes (Signed)
 Rx sent to her own pharmacy

## 2023-08-25 NOTE — Progress Notes (Signed)
 PT Cancellation Note  Patient Details Name: Deborah Jennings MRN: [id number] DOB: 05-Dec-1960   Cancelled Treatment:    Reason Eval/Treat Not Completed: (P) Fatigue/lethargy limiting ability to participate;Other (comment). Attempted PT session 2x this AM, 1x at 8 AM where pt reported being too tired to participate and requested PT to check back around 10 AM, then 1x at 10 AM where pt was sitting up and was just starting breakfast and requested PT check back again later. Will plan to follow-up later as able.   Theo Ferretti, PT, DPT Acute Rehabilitation Services  Office: [phone]    Theo CHRISTELLA Ferretti 08/25/2023, 10:09 AM

## 2023-08-25 NOTE — ED Triage Notes (Signed)
 Pt states left leg has been swollen for a week. Pt states she was d/c today and told to f/u with PCP.  Pt left foot is discolored, cool to touch, and +2 pedal pulse.   EMS states she was on her way to the restroom when she fell down. Pt states she doesn't feel comfortable at home and want answers.

## 2023-08-25 NOTE — Discharge Summary (Signed)
 Physician Discharge Summary  Deborah Jennings FMW:[id number] DOB: [birth year] DOA: 08/20/2023  PCP: Deborah Butler DASEN, MD  Admit date: 08/20/2023 Discharge date: 08/25/2023  Admitted From: Home Disposition:  Home  Discharge Condition:Stable CODE STATUS:FULL Diet recommendation: Heart Healthy  Brief/Interim Summary: Patient is a 63 year old female with history of migraine, irritable bowel syndrome, chronic low back pain who presented from home for the evaluation of fall.  On presentation, she was complaining of left-sided weakness, left upper and lower extremity swelling for last several months.  Ambulates with the help of walker at home.  Extensive evaluation done during this hospitalization with multiple tests.CT head, CT venogram abdomen/pelvis and bilateral lower extremity, MRI of the brain, CT chest with contrast did not show any acute findings.  MRI brain of the brain did not show any acute findings.  Most likely she has chronic pain syndrome.  She was previously following with neurology.  Physical therapy recommending outpatient follow-up.  We recommend close follow-up with her PCP, neurology and possibly pain management as an outpatient.  Medically stable for discharge home today.  Following problems were addressed during the hospitalization:  Left-sided weakness/edema: Unclear etiology.  Complaint of edema on the left upper and lower extremity with left-sided weakness.  Edema has improved. CT head, CT venogram abdomen/pelvis and bilateral lower extremity, MRI of the brain, CT chest with contrast all negative.  Doppler study did not show any DVT.  Discussed with neurology who did not think the symptoms are related to neurological disorder.  TSH normal.  Echo showed normal EF. PT recommend outpatient follow-up.   Dyspnea on exertion: Resumed home Lasix .  Follows with cardiology.  On Lasix  40 mg at home.  Echo done here showed normal  EF, grade 1 diastolic dysfunction.  No Wall Motion  Abnormality.     Recurrent falls/chronic back pain/balance issues: Has history of sciatica.  Currently on gabapentin .  PT/OT evaluation done.  Her primary care physician is planning to do outpatient MRI of the lumbar spine.   Chronic migraine: Sumatriptan  prn ordered.  Follows with neurology/PCP   Hypercholesteremia: On statin   GERD: Continue PPI   History of IBS: On Bentyl    Anxiety: On Lexapro    Diabetes type 2: Currently well-controlled.  Last A1c of 6.3     Discharge Diagnoses:  Principal Problem:   Left-sided weakness Active Problems:   Localized swelling of left lower extremity   Localized swelling of left upper extremity   Obesity (BMI 30-39.9)   Pressure injury of skin    Discharge Instructions  Discharge Instructions     Ambulatory referral to Physical Therapy   Complete by: As directed    PT evaluate and treat: General weakness frequent falls, Left arm swelling, possible nerve impingement   Diet - low sodium heart healthy   Complete by: As directed    Discharge instructions   Complete by: As directed    1)Please follow up with your PCP in a week 2)Follow up with neurology as an outpatient for the management of your chronic migraine 3)Follow up with the pain clinic as an outpatient for further management of your chronic pain 4)Follow up with outpatient physical therapy   Increase activity slowly   Complete by: As directed    No wound care   Complete by: As directed       Allergies as of 08/25/2023       Reactions   Omnipen [ampicillin] Hives, Swelling   OK with penicillin  Medication List     TAKE these medications    acetaminophen  500 MG tablet Commonly known as: TYLENOL  Take 1,000 mg by mouth 2 (two) times daily as needed for moderate pain (pain score 4-6) or headache.   ALPRAZolam  0.5 MG tablet Commonly known as: XANAX  Take 0.5 mg by mouth at bedtime as needed for sleep.   clonazePAM  0.5 MG tablet Commonly known as:  KLONOPIN  TAKE 1 TABLET BY MOUTH THREE TIMES DAILY AS NEEDED FOR ANXIETY   dicyclomine  20 MG tablet Commonly known as: BENTYL  Take 1 tablet (20 mg total) by mouth every 6 (six) hours as needed for spasms. What changed:  when to take this additional instructions   escitalopram  20 MG tablet Commonly known as: LEXAPRO  Take 1 tablet by mouth once daily   furosemide  40 MG tablet Commonly known as: LASIX  Take 1 tablet (40 mg total) by mouth daily. What changed: when to take this   gabapentin  300 MG capsule Commonly known as: NEURONTIN  Take 1 capsule (300 mg total) by mouth 3 (three) times daily. What changed: when to take this   Multivitamin Women 50+ Tabs Take 1 tablet by mouth daily.   Nurtec 75 MG Tbdp Generic drug: Rimegepant Sulfate Take 75 mg by mouth daily as needed (migraine).   omeprazole  20 MG tablet Commonly known as: PRILOSEC  OTC Take 20 mg by mouth daily as needed (heartburn).   ondansetron  4 MG disintegrating tablet Commonly known as: ZOFRAN -ODT Take 4 mg by mouth every 8 (eight) hours as needed for nausea or vomiting.   oxyCODONE  5 MG immediate release tablet Commonly known as: Oxy IR/ROXICODONE  Take 1 tablet (5 mg total) by mouth every 6 (six) hours as needed for moderate pain (pain score 4-6).   rizatriptan  10 MG disintegrating tablet Commonly known as: MAXALT -MLT Take 10 mg by mouth daily as needed for migraine. May repeat dose in two hour if headache persists or recurs. Do not exceed 2 tablets in 24 hours.   rosuvastatin  10 MG tablet Commonly known as: CRESTOR  Take 1 tablet by mouth once daily   valACYclovir  500 MG tablet Commonly known as: VALTREX  Take 500 mg by mouth daily.        Follow-up Information     [facility] Follow up.   Specialty: Rehabilitation Why: THey will call you to set up services Contact information: [address] [place]   [address] [phone]        Deborah Butler DASEN, MD. Schedule an appointment as soon as possible for a visit in 1 week(s).   Specialty: Family Medicine Contact information: [address] [place] [address] [phone]                Allergies  Allergen Reactions   Omnipen [Ampicillin] Hives and Swelling    OK with penicillin    Consultations: None   Procedures/Studies: ECHOCARDIOGRAM COMPLETE Result Date: 08/24/2023    ECHOCARDIOGRAM REPORT   Patient Name:   DEEPTI GUNAWAN Date of Exam: 08/24/2023 Medical Rec #:  [id number]     Height:       65.0 in Accession #:    [id number]    Weight:       235.0 lb Date of Birth:  10/08/1960    BSA:          2.118 m Patient Age:    62 years      BP:  174/104 mmHg Patient Gender: F             HR:           84 bpm. Exam Location:  Inpatient Procedure: 2D Echo, Cardiac Doppler and Color Doppler (Both Spectral and Color            Flow Doppler were utilized during procedure). Indications:    Dyspnea  History:        Patient has prior history of Echocardiogram examinations.  Sonographer:    CM Referring Phys: [id number] CONCEPCION RISER  Sonographer Comments: Patient is obese and Technically difficult study due to poor echo windows. Image acquisition challenging due to patient body habitus. IMPRESSIONS  1. Left ventricular ejection fraction, by estimation, is 55 to 60%. The left ventricle has normal function. The left ventricle has no regional wall motion abnormalities. There is mild concentric left ventricular hypertrophy. Left ventricular diastolic parameters are consistent with Grade I diastolic dysfunction (impaired relaxation).  2. Right ventricular systolic function is normal. The right ventricular size is normal.  3. The mitral valve is normal in structure. No evidence of mitral valve regurgitation. No evidence of mitral stenosis.  4. The aortic valve is tricuspid. Aortic valve regurgitation is not visualized. Aortic valve  sclerosis/calcification is present, without any evidence of aortic stenosis. Comparison(s): No significant change from prior study. Prior images reviewed side by side. FINDINGS  Left Ventricle: Left ventricular ejection fraction, by estimation, is 55 to 60%. The left ventricle has normal function. The left ventricle has no regional wall motion abnormalities. The left ventricular internal cavity size was normal in size. There is  mild concentric left ventricular hypertrophy. Left ventricular diastolic parameters are consistent with Grade I diastolic dysfunction (impaired relaxation). Right Ventricle: The right ventricular size is normal. No increase in right ventricular wall thickness. Right ventricular systolic function is normal. Left Atrium: Left atrial size was normal in size. Right Atrium: Right atrial size was normal in size. Pericardium: Trivial pericardial effusion is present. The pericardial effusion is anterior to the right ventricle. Presence of epicardial fat layer. Mitral Valve: The mitral valve is normal in structure. No evidence of mitral valve regurgitation. No evidence of mitral valve stenosis. Tricuspid Valve: The tricuspid valve is normal in structure. Tricuspid valve regurgitation is not demonstrated. No evidence of tricuspid stenosis. Aortic Valve: The aortic valve is tricuspid. Aortic valve regurgitation is not visualized. Aortic valve sclerosis/calcification is present, without any evidence of aortic stenosis. Aortic valve mean gradient measures 5.0 mmHg. Aortic valve peak gradient measures 10.2 mmHg. Aortic valve area, by VTI measures 2.77 cm. Pulmonic Valve: The pulmonic valve was normal in structure. Pulmonic valve regurgitation is not visualized. No evidence of pulmonic stenosis. Aorta: The aortic root and ascending aorta are structurally normal, with no evidence of dilitation. IAS/Shunts: The atrial septum is grossly normal.  LEFT VENTRICLE PLAX 2D LVIDd:         4.40 cm   Diastology  LVIDs:         3.10 cm   LV e' medial:    9.57 cm/s LV PW:         1.20 cm   LV E/e' medial:  10.0 LV IVS:        1.20 cm   LV e' lateral:   7.72 cm/s LVOT diam:     2.10 cm   LV E/e' lateral: 12.4 LV SV:         79 LV SV Index:   37 LVOT Area:  3.46 cm  RIGHT VENTRICLE RV S prime:     16.20 cm/s TAPSE (M-mode): 2.1 cm AORTIC VALVE AV Area (Vmax):    2.75 cm AV Area (Vmean):   2.45 cm AV Area (VTI):     2.77 cm AV Vmax:           160.00 cm/s AV Vmean:          101.000 cm/s AV VTI:            0.284 m AV Peak Grad:      10.2 mmHg AV Mean Grad:      5.0 mmHg LVOT Vmax:         127.00 cm/s LVOT Vmean:        71.300 cm/s LVOT VTI:          0.227 m LVOT/AV VTI ratio: 0.80  AORTA Ao Root diam: 2.80 cm Ao Asc diam:  3.20 cm MITRAL VALVE MV Area (PHT): 5.20 cm    SHUNTS MV Decel Time: 146 msec    Systemic VTI:  0.23 m MV E velocity: 95.90 cm/s  Systemic Diam: 2.10 cm MV A velocity: 90.10 cm/s MV E/A ratio:  1.06 Stanly Leavens MD Electronically signed by Stanly Leavens MD Signature Date/Time: 08/24/2023/12:53:50 PM    Final    VAS US  LOWER EXTREMITY VENOUS (DVT) Result Date: 08/23/2023  Lower Venous DVT Study Patient Name:  ADDISSON FRATE  Date of Exam:   08/23/2023 Medical Rec #: [id number]      Accession #:    [id number] Date of Birth: [birth year]     Patient Gender: F Patient Age:   81 years Exam Location:  [facility] Procedure:      VAS US  LOWER EXTREMITY VENOUS (DVT) Referring Phys: CONCEPCION RISER --------------------------------------------------------------------------------  Indications: Swelling, and Edema.  Performing Technologist: Elmarie Lindau, RVT  Examination Guidelines: A complete evaluation includes B-mode imaging, spectral Doppler, color Doppler, and power Doppler as needed of all accessible portions of each vessel. Bilateral testing is considered an integral part of a complete examination. Limited examinations for reoccurring indications may be performed as noted. The reflux  portion of the exam is performed with the patient in reverse Trendelenburg.  +---------+---------------+---------+-----------+----------+--------------+ LEFT     CompressibilityPhasicitySpontaneityPropertiesThrombus Aging +---------+---------------+---------+-----------+----------+--------------+ CFV      Full           Yes      Yes                                 +---------+---------------+---------+-----------+----------+--------------+ SFJ      Full                                                        +---------+---------------+---------+-----------+----------+--------------+ FV Prox  Full                                                        +---------+---------------+---------+-----------+----------+--------------+ FV Mid   Full                                                        +---------+---------------+---------+-----------+----------+--------------+  FV DistalFull                                                        +---------+---------------+---------+-----------+----------+--------------+ PFV      Full                                                        +---------+---------------+---------+-----------+----------+--------------+ POP      Full           Yes      Yes                                 +---------+---------------+---------+-----------+----------+--------------+ PTV      Full                                                        +---------+---------------+---------+-----------+----------+--------------+ PERO     Full                                                        +---------+---------------+---------+-----------+----------+--------------+     Summary: LEFT: - There is no evidence of deep vein thrombosis in the lower extremity.  - No cystic structure found in the popliteal fossa.  *See table(s) above for measurements and observations. Electronically signed by Lonni Gaskins MD on 08/23/2023 at 5:22:30 PM.     Final    VAS US  UPPER EXTREMITY VENOUS DUPLEX Result Date: 08/23/2023 UPPER VENOUS STUDY  Patient Name:  OVIYA AMMAR  Date of Exam:   08/23/2023 Medical Rec #: [id number]      Accession #:    [id number] Date of Birth: May 06, 1960     Patient Gender: F Patient Age:   70 years Exam Location:  [facility] Procedure:      VAS US  UPPER EXTREMITY VENOUS DUPLEX Referring Phys: VELNA SKEETER --------------------------------------------------------------------------------  Indications: Swelling, and Pain Performing Technologist: Elmarie Lindau, RVT  Examination Guidelines: A complete evaluation includes B-mode imaging, spectral Doppler, color Doppler, and power Doppler as needed of all accessible portions of each vessel. Bilateral testing is considered an integral part of a complete examination. Limited examinations for reoccurring indications may be performed as noted.  Right Findings: +----------+------------+---------+-----------+----------+-------+ RIGHT     CompressiblePhasicitySpontaneousPropertiesSummary +----------+------------+---------+-----------+----------+-------+ Subclavian    Full                                          +----------+------------+---------+-----------+----------+-------+  Left Findings: +----------+------------+---------+-----------+----------+-------+ LEFT      CompressiblePhasicitySpontaneousPropertiesSummary +----------+------------+---------+-----------+----------+-------+ IJV           Full       Yes       Yes                      +----------+------------+---------+-----------+----------+-------+  Subclavian    Full                                          +----------+------------+---------+-----------+----------+-------+ Axillary      Full                                          +----------+------------+---------+-----------+----------+-------+ Brachial      Full                                           +----------+------------+---------+-----------+----------+-------+ Radial        Full                                          +----------+------------+---------+-----------+----------+-------+ Ulnar         Full                                          +----------+------------+---------+-----------+----------+-------+ Cephalic      Full                                          +----------+------------+---------+-----------+----------+-------+ Basilic       Full                                          +----------+------------+---------+-----------+----------+-------+  Summary:  Right: No evidence of thrombosis in the subclavian.  Left: No evidence of deep vein thrombosis in the upper extremity.  *See table(s) above for measurements and observations.  Diagnosing physician: Lonni Gaskins MD Electronically signed by Lonni Gaskins MD on 08/23/2023 at 5:21:48 PM.    Final    MR BRAIN WO CONTRAST Result Date: 08/21/2023 CLINICAL DATA:  Provided history: Neuro deficit, acute, stroke suspected. EXAM: MRI HEAD WITHOUT CONTRAST TECHNIQUE: Multiplanar, multiecho pulse sequences of the brain and surrounding structures were obtained without intravenous contrast. COMPARISON:  Head CT 08/21/2023.  Brain MRI 11/16/2022. FINDINGS: Intermittently motion degraded examination (with up to moderate motion degradation of the acquired sequences). Brain: Mild generalized cerebral atrophy. Mild multifocal T2 FLAIR hyperintense signal abnormality within the cerebral white matter, nonspecific but most often secondary to chronic small vessel ischemia. No cortical encephalomalacia is identified. There is no acute infarct. No evidence of an intracranial mass. No chronic intracranial blood products. No extra-axial fluid collection. No midline shift. Vascular: Maintained flow voids within the proximal large arterial vessels. Skull and upper cervical spine: No focal worrisome marrow lesion. Sinuses/Orbits: No  mass or acute finding within the imaged orbits. No significant paranasal sinus disease. IMPRESSION: 1. Intermittently motion degraded examination. 2. No evidence of an acute intracranial abnormality. The diffusion-weighted imaging is of good quality and there is no evidence of an acute infarct. 3. Mild multifocal T2 FLAIR hyperintense signal abnormality within the cerebral white matter, nonspecific  but most often secondary to chronic small vessel ischemia. These findings are similar to the prior MRI of 11/16/2022. 4. Mild generalized cerebral atrophy. Electronically Signed   By: Rockey Childs D.O.   On: 08/21/2023 10:18   CT VENOGRAM ABD/PELVIS/LOWER EXT BILAT Result Date: 08/21/2023 EXAM: CTA ABDOMEN AND PELVIS WITH CONTRAST AND RUNOFF CTA OF THE LOWER EXTREMITIES WITH CONTRAST 08/21/2023 12:57:40 AM TECHNIQUE: CTA images of the abdomen, pelvis and lower extremities with intravenous contrast. Three-dimensional MIP/volume rendered formations were performed. Automated exposure control, iterative reconstruction, and/or weight based adjustment of the mA/kV was utilized to reduce the radiation dose to as low as reasonably achievable. COMPARISON: None available. CLINICAL HISTORY: Deep venous thrombosis (DVT); Lymphadenopathy, groin. FINDINGS: VASCULATURE: No evidence of venous thrombosis to the level of the popliteal veins. AORTA: No acute finding. No abdominal aortic aneurysm. No dissection. CELIAC TRUNK: No acute finding. No occlusion or significant stenosis. SUPERIOR MESENTERIC ARTERY: No acute finding. No occlusion or significant stenosis. INFERIOR MESENTERIC ARTERY: No acute finding. No occlusion or significant stenosis. RENAL ARTERIES: No acute finding. No occlusion or significant stenosis. RIGHT ILIAC ARTERIES: No acute finding. No occlusion or significant stenosis. RIGHT FEMORAL ARTERIES: No acute finding. No occlusion or significant stenosis. RIGHT POPLITEAL ARTERY: No acute finding. No occlusion or  significant stenosis. RIGHT CALF ARTERIES: No acute finding. No occlusion or significant stenosis. LEFT ILIAC ARTERIES: No acute finding. No occlusion or significant stenosis. LEFT FEMORAL ARTERIES: No acute finding. No occlusion or significant stenosis. LEFT POPLITEAL ARTERY: No acute finding. No occlusion or significant stenosis. LEFT CALF ARTERIES: No acute finding. No occlusion or significant stenosis. ABDOMEN AND PELVIS: LOWER CHEST: Visualized portion of the lower chest demonstrates no acute abnormality. LIVER: The liver is unremarkable. GALLBLADDER AND BILE DUCTS: Gallbladder is unremarkable. No biliary ductal dilatation. SPLEEN: The spleen is unremarkable. PANCREAS: The pancreas is unremarkable. ADRENAL GLANDS: Bilateral adrenal glands demonstrate no acute abnormality. KIDNEYS, URETERS AND BLADDER: No stones in the kidneys or ureters. No hydronephrosis. No evidence of perinephric or periureteral stranding. Urinary bladder is unremarkable. GI AND BOWEL: Stomach and duodenal sweep demonstrate no acute abnormality. There is no bowel obstruction. No abnormal bowel wall thickening or distension. Normal appendix (image 62). Mild sigmoid diverticulosis, without evidence of diverticulitis. REPRODUCTIVE: Status post hysterectomy. PERITONEUM AND RETROPERITONEUM: No ascites or free air. LYMPH NODES: No evidence of lymphadenopathy. BONES AND SOFT TISSUES: Mild degenerative changes at L5-S1. Mild subcutaneous stranding/fluid along the left lateral thigh (image 128) and extending inferiorly to the left lateral upper calf (image 163). IMPRESSION: 1. No evidence of venous thrombosis to the level of the popliteal veins. 2. Mild subcutaneous stranding/fluid along the left lateral thigh and extending inferiorly to the left lateral upper calf. 3. Mild sigmoid diverticulosis, without evidence of diverticulitis. Electronically signed by: Pinkie Pebbles MD 08/21/2023 01:12 AM EDT RP Workstation: [id number]   CT CHEST W  CONTRAST Result Date: 08/21/2023 EXAM: CT CHEST WITH CONTRAST 08/21/2023 12:57:40 AM TECHNIQUE: CT of the chest was performed with the administration of intravenous contrast. Multiplanar reformatted images are provided for review. Automated exposure control, iterative reconstruction, and/or weight based adjustment of the mA/kV was utilized to reduce the radiation dose to as low as reasonably achievable. COMPARISON: None available. CLINICAL HISTORY: Lymphadenopathy. FINDINGS: MEDIASTINUM: Heart and pericardium are unremarkable. The central airways are clear. LYMPH NODES: No suspicious mediastinal, hilar, or axillary lymphadenopathy. 8 mm short axis left axillary node (61), within normal limits. LUNGS AND PLEURA: Mild bibasilar atelectasis. No focal consolidation or pulmonary edema. No  pleural effusion or pneumothorax. SOFT TISSUES/BONES: No acute abnormality of the bones or soft tissues. UPPER ABDOMEN: Limited images of the upper abdomen demonstrates no acute abnormality. IMPRESSION: 1. Negative CT chest. Electronically signed by: Pinkie Pebbles MD 08/21/2023 01:08 AM EDT RP Workstation: [id number]   CT Head Wo Contrast Result Date: 08/21/2023 EXAM: CT HEAD WITHOUT CONTRAST 08/21/2023 12:57:40 AM TECHNIQUE: CT of the head was performed without the administration of intravenous contrast. Automated exposure control, iterative reconstruction, and/or weight based adjustment of the mA/kV was utilized to reduce the radiation dose to as low as reasonably achievable. COMPARISON: None available. CLINICAL HISTORY: Vertigo, central. FINDINGS: BRAIN AND VENTRICLES: No acute hemorrhage. Gray-white differentiation is preserved. No hydrocephalus. No extra-axial collection. No mass effect or midline shift. ORBITS: No acute abnormality. SINUSES: No acute abnormality. SOFT TISSUES AND SKULL: No acute soft tissue abnormality. No skull fracture. IMPRESSION: 1. No acute intracranial abnormality. Electronically signed by: Franky Stanford MD 08/21/2023 01:01 AM EDT RP Workstation: [id number]   DG Chest Port 1 View Result Date: 08/20/2023 CLINICAL DATA:  Fatigue. EXAM: PORTABLE CHEST 1 VIEW COMPARISON:  01/06/2021 FINDINGS: Patient is rotated. Stable heart size and mediastinal contours. Question of lingular opacity versus prominent epicardial fat pad. No pulmonary edema, pleural fluid or pneumothorax. IMPRESSION: Question of lingular opacity versus prominent epicardial fat pad. Electronically Signed   By: Andrea Gasman M.D.   On: 08/20/2023 21:35      Subjective: Patient seen and examined at bedside today.  Hemodynamically stable.  Today again, she complained of pain in different areas of her body.  She complained of new neck pain that started this morning.  She complains of left shoulder pain which is also a new problem today.  We discussed about her extensive investigation during this hospitalization which did not show any acute findings.  Her primary care physician is already planning for outpatient lumbar spine MRI.  I encouraged her to follow-up with her primary doctor and neurology as soon as possible.  I also mentioned that she may benefit by following with pain management clinic as an outpatient.  Her pain symptoms are chronic .  Discharge Exam: Vitals:   08/25/23 0436 08/25/23 0801  BP: (!) 156/87 125/61  Pulse: 71 77  Resp:  15  Temp: 98.1 F (36.7 C) 98.1 F (36.7 C)  SpO2: 91% 91%   Vitals:   08/24/23 2049 08/25/23 0100 08/25/23 0436 08/25/23 0801  BP: (!) 133/97 (!) 148/73 (!) 156/87 125/61  Pulse: 92  71 77  Resp: 18 18  15   Temp:  97.7 F (36.5 C) 98.1 F (36.7 C) 98.1 F (36.7 C)  TempSrc: Oral Oral    SpO2: 92% 92% 91% 91%  Weight:        General: Pt is alert, awake, not in acute distress,obese Cardiovascular: RRR, S1/S2 +, no rubs, no gallops Respiratory: CTA bilaterally, no wheezing, no rhonchi Abdominal: Soft, NT, ND, bowel sounds + Extremities: no edema, no cyanosis    The  results of significant diagnostics from this hospitalization (including imaging, microbiology, ancillary and laboratory) are listed below for reference.     Microbiology: No results found for this or any previous visit (from the past 240 hours).   Labs: BNP (last 3 results) No results for input(s): BNP in the last 8760 hours. Basic Metabolic Panel: Recent Labs  Lab 08/20/23 2119 08/20/23 2314 08/21/23 1425 08/22/23 0319 08/25/23 0239  NA 144 142  --  138 139  K 3.6 3.5  --  3.5 4.0  CL 104 105  --  100 97*  CO2 24  --   --  24 26  GLUCOSE 84 99  --  101* 108*  BUN 6* 6*  --  18 16  CREATININE 0.69 0.80 0.77 0.92 0.85  CALCIUM  9.1  --   --  9.1 9.4  MG  --   --  1.8  --   --   PHOS  --   --  4.6  --   --    Liver Function Tests: Recent Labs  Lab 08/20/23 2119  AST 34  ALT 22  ALKPHOS 59  BILITOT 0.6  PROT 7.3  ALBUMIN 3.9   Recent Labs  Lab 08/20/23 2119  LIPASE 28   No results for input(s): AMMONIA in the last 168 hours. CBC: Recent Labs  Lab 08/20/23 2119 08/20/23 2314 08/21/23 1425 08/22/23 0319 08/25/23 0239  WBC 7.1  --  7.9 9.1 7.2  NEUTROABS 3.8  --   --   --   --   HGB 15.3* 14.6 13.7 14.3 14.9  HCT 44.9 43.0 41.7 43.0 44.3  MCV 96.1  --  99.8 97.7 98.4  PLT 239  --  242 205 168   Cardiac Enzymes: No results for input(s): CKTOTAL, CKMB, CKMBINDEX, TROPONINI in the last 168 hours. BNP: Invalid input(s): POCBNP CBG: No results for input(s): GLUCAP in the last 168 hours. D-Dimer No results for input(s): DDIMER in the last 72 hours. Hgb A1c No results for input(s): HGBA1C in the last 72 hours. Lipid Profile No results for input(s): CHOL, HDL, LDLCALC, TRIG, CHOLHDL, LDLDIRECT in the last 72 hours. Thyroid  function studies No results for input(s): TSH, T4TOTAL, T3FREE, THYROIDAB in the last 72 hours.  Invalid input(s): FREET3 Anemia work up No results for input(s): VITAMINB12, FOLATE,  FERRITIN, TIBC, IRON, RETICCTPCT in the last 72 hours. Urinalysis    Component Value Date/Time   COLORURINE YELLOW 08/14/2020 1231   APPEARANCEUR CLEAR 08/14/2020 1231   LABSPEC 1.015 08/14/2020 1231   PHURINE 5.5 08/14/2020 1231   GLUCOSEU NEGATIVE 08/14/2020 1231   HGBUR NEGATIVE 08/14/2020 1231   BILIRUBINUR NEGATIVE 04/21/2016 1144   KETONESUR NEGATIVE 08/14/2020 1231   PROTEINUR TRACE (A) 08/14/2020 1231   UROBILINOGEN 0.2 07/16/2014 1636   NITRITE NEGATIVE 08/14/2020 1231   LEUKOCYTESUR NEGATIVE 08/14/2020 1231   Sepsis Labs Recent Labs  Lab 08/20/23 2119 08/21/23 1425 08/22/23 0319 08/25/23 0239  WBC 7.1 7.9 9.1 7.2   Microbiology No results found for this or any previous visit (from the past 240 hours).  Please note: You were cared for by a hospitalist during your hospital stay. Once you are discharged, your primary care physician will handle any further medical issues. Please note that NO REFILLS for any discharge medications will be authorized once you are discharged, as it is imperative that you return to your primary care physician (or establish a relationship with a primary care physician if you do not have one) for your post hospital discharge needs so that they can reassess your need for medications and monitor your lab values.    Time coordinating discharge: 40 minutes  SIGNED:   Ivonne Mustache, MD  [facility] 08/25/2023, 11:59 AM Pager [phone]  If 7PM-7AM, please contact night-coverage [email or web address] Password [email or web address]

## 2023-08-25 NOTE — Progress Notes (Signed)
 Physical Therapy Treatment Patient Details Name: Deborah Jennings MRN: [id number] DOB: 1960-08-17 Today's Date: 08/25/2023   History of Present Illness 63 yo F arrived via EMS 7/18 after falling. Complaints of left arm and left leg swelling and pain over last 6 months that have contributed to fall. MRI 11/2022 negative for stroke. 08/21/23 CT  imaging is unremarkable without obvious clot or abnormality on the left side of the body which would result in her swelling. PMH: none official in chart. Pt reports chronic headache, and resection of 2 brain tumors.    PT Comments  Focused session on gait and stair training in preparation for anticipated d/c home. Currently, the pt is able to ambulate up to ~85 ft with a RW at a CGA level, needing repeated verbal cues to clear her L foot and take longer steps with her L to progress from a step-to gait pattern to a step-through gait pattern with less L foot drag. Pt was also able to navigate up/down x3 stairs with bil handrail support at a CGA level. Educated pt on leading up with her good leg and down with her bad leg with good compliance noted. Educated pt on her risk for falls and this PT's recs to have someone supervise/assist her with standing mobility using a RW at d/c until her stability improves. Provided her with a gait belt. Educated pt to elevate L extremities when at rest to manage edema. Pt verbalized understanding. Will continue to follow acutely.    If plan is discharge home, recommend the following: A little help with walking and/or transfers;A little help with bathing/dressing/bathroom;Assistance with cooking/housework;Assist for transportation;Help with stairs or ramp for entrance   Can travel by private vehicle        Equipment Recommendations  None recommended by PT    Recommendations for Other Services       Precautions / Restrictions Precautions Precautions: Fall Precaution/Restrictions Comments: numerous falls prior to coming to  ED Restrictions Weight Bearing Restrictions Per Provider Order: No     Mobility  Bed Mobility               General bed mobility comments: Pt sitting up in recliner upon arrival and at end of session.    Transfers Overall transfer level: Needs assistance Equipment used: Rolling walker (2 wheels) Transfers: Sit to/from Stand Sit to Stand: Contact guard assist           General transfer comment: Extra time to power up to stand, CGA for safety, no LOB    Ambulation/Gait Ambulation/Gait assistance: Contact guard assist Gait Distance (Feet): 85 Feet Assistive device: Rolling walker (2 wheels) Gait Pattern/deviations: Decreased stance time - left, Shuffle, Antalgic, Trunk flexed, Step-to pattern, Step-through pattern, Decreased step length - left, Decreased dorsiflexion - left Gait velocity: slowed Gait velocity interpretation: <1.8 ft/sec, indicate of risk for recurrent falls   General Gait Details: Pt ambulates with a step-to gait pattern, often dragging her L foot and not advancing it as far as the R when stepping. VCs provided to march and take longer steps with L foot with noted momentary success and progression to a step-through gait pattern. No LOB, CGA for safety   Stairs Stairs: Yes Stairs assistance: Contact guard assist Stair Management: Two rails, Step to pattern, One rail Right, Forwards Number of Stairs: 3 General stair comments: Ascends with bil rails and descends with R rail. Ascends leading up with her R leg and descends leading down with her L leg as cued. No LOB,  CGA for safety   Wheelchair Mobility     Tilt Bed    Modified Rankin (Stroke Patients Only)       Balance Overall balance assessment: Needs assistance Sitting-balance support: Feet supported, No upper extremity supported Sitting balance-Leahy Scale: Good     Standing balance support: During functional activity, Single extremity supported, Bilateral upper extremity  supported Standing balance-Leahy Scale: Poor Standing balance comment: Reliant on RW to ambulate                            Communication Communication Communication: No apparent difficulties  Cognition Arousal: Alert Behavior During Therapy: WFL for tasks assessed/performed   PT - Cognitive impairments: No apparent impairments                       PT - Cognition Comments: Pt emotional upon arrival in regards to her pain and situation, needing consoling. Pt appreciative Following commands: Intact      Cueing Cueing Techniques: Verbal cues, Gestural cues, Tactile cues  Exercises      General Comments General comments (skin integrity, edema, etc.): Educated pt on her risk for falls and this PT's recs to have someone supervise/assist her with standing mobility using a RW at d/c until her stability improves. Provided her with a gait belt. Educated pt to elevate L extremities when at rest to manage edema. Pt verbalized understanding.      Pertinent Vitals/Pain Pain Assessment Pain Assessment: 0-10 Pain Score: 10-Worst pain ever Pain Location: headache, L extremities, back Pain Descriptors / Indicators: Sore, Headache, Discomfort, Grimacing Pain Intervention(s): Limited activity within patient's tolerance, Monitored during session, Repositioned, Patient requesting pain meds-RN notified, RN gave pain meds during session    Home Living                          Prior Function            PT Goals (current goals can now be found in the care plan section) Acute Rehab PT Goals Patient Stated Goal: decrease swelling PT Goal Formulation: With patient Time For Goal Achievement: 09/04/23 Potential to Achieve Goals: Good Progress towards PT goals: Progressing toward goals    Frequency    Min 2X/week      PT Plan      Co-evaluation              AM-PAC PT 6 Clicks Mobility   Outcome Measure  Help needed turning from your back to your  side while in a flat bed without using bedrails?: None Help needed moving from lying on your back to sitting on the side of a flat bed without using bedrails?: None Help needed moving to and from a bed to a chair (including a wheelchair)?: A Little Help needed standing up from a chair using your arms (e.g., wheelchair or bedside chair)?: A Little Help needed to walk in hospital room?: A Little Help needed climbing 3-5 steps with a railing? : A Little 6 Click Score: 20    End of Session Equipment Utilized During Treatment: Gait belt Activity Tolerance: Patient tolerated treatment well Patient left: in chair;with call bell/phone within reach (no chair alarm on upon arrival) Nurse Communication: Patient requests pain meds;Other (comment) (RN present providing pain meds upon arrival) PT Visit Diagnosis: Unsteadiness on feet (R26.81);Other abnormalities of gait and mobility (R26.89);Muscle weakness (generalized) (M62.81);History of falling (Z91.81);Repeated falls (R29.6);Difficulty in  walking, not elsewhere classified (R26.2);Pain Pain - Right/Left: Left Pain - part of body: Leg;Arm     Time: 8891-8860 PT Time Calculation (min) (ACUTE ONLY): 31 min  Charges:    $Gait Training: 23-37 mins PT General Charges $$ ACUTE PT VISIT: 1 Visit                     Theo Ferretti, PT, DPT Acute Rehabilitation Services  Office: [phone]    Theo CHRISTELLA Ferretti 08/25/2023, 2:19 PM

## 2023-08-26 ENCOUNTER — Emergency Department (HOSPITAL_COMMUNITY): Payer: PRIVATE HEALTH INSURANCE

## 2023-08-26 ENCOUNTER — Other Ambulatory Visit (HOSPITAL_COMMUNITY): Payer: Self-pay

## 2023-08-26 MED ORDER — GADOBUTROL 1 MMOL/ML IV SOLN
10.0000 mL | Freq: Once | INTRAVENOUS | Status: AC | PRN
  Administered 2023-08-26: 10 mL via INTRAVENOUS

## 2023-08-26 MED ORDER — HYDROMORPHONE HCL 1 MG/ML IJ SOLN
1.0000 mg | Freq: Once | INTRAMUSCULAR | Status: AC
  Administered 2023-08-26: 1 mg via INTRAVENOUS
  Filled 2023-08-26: qty 1

## 2023-08-26 MED ORDER — LORAZEPAM 2 MG/ML IJ SOLN
1.0000 mg | INTRAMUSCULAR | Status: DC | PRN
  Administered 2023-08-26: 1 mg via INTRAVENOUS
  Filled 2023-08-26: qty 1

## 2023-08-26 MED ORDER — METHYLPREDNISOLONE 4 MG PO TBPK: ORAL_TABLET | ORAL | 0 refills | Status: AC

## 2023-08-26 MED ORDER — DROPERIDOL 2.5 MG/ML IJ SOLN
1.2500 mg | Freq: Once | INTRAMUSCULAR | Status: AC
  Administered 2023-08-26: 1.25 mg via INTRAVENOUS
  Filled 2023-08-26: qty 2

## 2023-08-26 NOTE — ED Provider Notes (Signed)
 [place] EMERGENCY DEPARTMENT AT [facility] Provider Note   CSN: [id number] Arrival date & time: 08/25/23  2003     Patient presents with: Leg Swelling   Deborah Jennings is a 63 y.o. female.  {Add pertinent medical, surgical, social history, OB history to HPI:9457} 63 year old female who presents the ER today secondary to persistent leg swelling and pain.  Patient was just recently in the hospital for almost a week and had multiple tests done and was discharged yesterday afternoon.  She states her show at home she thinks that her walker got caught on something or maybe her leg gave out but she is not sure.  She ended up falling.  She is concerned that she does not know what is going on with the weakness in her leg and the pain and swelling in her leg.  She has a history of lumbar surgery but not recently.  She has a history of sciatica.  She states she is takes fluid pills because she thinks it might be fluid on her leg however these do not seem to be helping.  On her recent hospitalization she had multiple CT scans, MRI of her brain, ruled out DVT.  During the hospital stay she also had left upper extremity swelling.  She states she had an episode similar to this a few months back but then it only lasted a day or 2.  This has been going on for at least a week.        Prior to Admission medications   Medication Sig Start Date End Date Taking? Authorizing Provider  acetaminophen  (TYLENOL ) 500 MG tablet Take 1,000 mg by mouth 2 (two) times daily as needed for moderate pain (pain score 4-6) or headache.    [provider]  ALPRAZolam  (XANAX ) 0.5 MG tablet Take 0.5 mg by mouth at bedtime as needed for sleep.    [provider]  clonazePAM  (KLONOPIN ) 0.5 MG tablet TAKE 1 TABLET BY MOUTH THREE TIMES DAILY AS NEEDED FOR ANXIETY 10/30/22   Duanne Butler DASEN, MD  dicyclomine  (BENTYL ) 20 MG tablet Take 1 tablet (20 mg total) by mouth every 6 (six) hours as needed for  spasms. Patient taking differently: Take 20 mg by mouth See admin instructions. Take 1 tablet (20mg ) by mouth twice daily but may take 1 tablet up to every 6 hours if needed for IBS. 06/21/23   Duanne Butler DASEN, MD  escitalopram  (LEXAPRO ) 20 MG tablet Take 1 tablet by mouth once daily 06/25/23   Duanne Butler DASEN, MD  furosemide  (LASIX ) 40 MG tablet Take 1 tablet (40 mg total) by mouth daily. 08/25/23   Jillian Buttery, MD  gabapentin  (NEURONTIN ) 300 MG capsule Take 1 capsule (300 mg total) by mouth 3 (three) times daily. Patient taking differently: Take 300 mg by mouth 2 (two) times daily. 06/01/23   Duanne Butler DASEN, MD  Multiple Vitamins-Minerals (MULTIVITAMIN WOMEN 50+) TABS Take 1 tablet by mouth daily.    [provider]  omeprazole  (PRILOSEC  OTC) 20 MG tablet Take 20 mg by mouth daily as needed (heartburn).    [provider]  ondansetron  (ZOFRAN -ODT) 4 MG disintegrating tablet Take 4 mg by mouth every 8 (eight) hours as needed for nausea or vomiting.    [provider]  oxyCODONE  (OXY IR/ROXICODONE ) 5 MG immediate release tablet Take 1 tablet (5 mg total) by mouth every 6 (six) hours as needed for moderate pain (pain score 4-6). 08/25/23   Adhikari, Amrit, MD  Rimegepant  Sulfate (NURTEC) 75 MG TBDP Take 75 mg by mouth daily as needed (migraine).    [provider]  rizatriptan  (MAXALT -MLT) 10 MG disintegrating tablet Take 10 mg by mouth daily as needed for migraine. May repeat dose in two hour if headache persists or recurs. Do not exceed 2 tablets in 24 hours.    [provider]  rosuvastatin  (CRESTOR ) 10 MG tablet Take 1 tablet by mouth once daily 03/01/23   Duanne Butler DASEN, MD  SUMAtriptan  (IMITREX ) 50 MG tablet Take 1 tablet (50 mg total) by mouth every 2 (two) hours as needed for migraine or headache. May repeat in 2 hours if headache persists or recurs. 08/25/23   Jillian Buttery, MD  valACYclovir  (VALTREX ) 500 MG tablet Take 500 mg by mouth  daily.    [provider]    Allergies: Omnipen [ampicillin]    Review of Systems  Updated Vital Signs BP 115/76 (BP Location: Right Arm)   Pulse 96   Temp 98.6 F (37 C)   Resp 18   Ht 5' 6 (1.676 m)   Wt 106.6 kg   SpO2 94%   BMI 37.93 kg/m   Physical Exam Vitals and nursing note reviewed.  Constitutional:      Appearance: She is well-developed.  HENT:     Head: Normocephalic and atraumatic.  Cardiovascular:     Rate and Rhythm: Normal rate and regular rhythm.  Pulmonary:     Effort: No respiratory distress.     Breath sounds: No stridor.  Abdominal:     General: There is no distension.  Musculoskeletal:     Cervical back: Normal range of motion.  Skin:    Comments: Her left lower extremity is significantly more swollen than her right and slightly erythematous but not warm or indurated.  No calf tenderness.  She has a good pulse.  Some decreased strength on her left plantarflexion.  Her left arm and hand are slightly swollen as well but she states this is much improved from previously.  Neurological:     Mental Status: She is alert.     (all labs ordered are listed, but only abnormal results are displayed) Labs Reviewed - No data to display  EKG: None  Radiology: ECHOCARDIOGRAM COMPLETE Result Date: 08/24/2023    ECHOCARDIOGRAM REPORT   Patient Name:   Deborah Jennings Date of Exam: 08/24/2023 Medical Rec #:  [id number]     Height:       65.0 in Accession #:    [id number]    Weight:       235.0 lb Date of Birth:  23-Sep-1960    BSA:          2.118 m Patient Age:    62 years      BP:           174/104 mmHg Patient Gender: F             HR:           84 bpm. Exam Location:  Inpatient Procedure: 2D Echo, Cardiac Doppler and Color Doppler (Both Spectral and Color            Flow Doppler were utilized during procedure). Indications:    Dyspnea  History:        Patient has prior history of Echocardiogram examinations.  Sonographer:    CM Referring Phys: [id number]  CONCEPCION RISER  Sonographer Comments: Patient is obese and Technically difficult study due to poor echo windows. Image acquisition challenging  due to patient body habitus. IMPRESSIONS  1. Left ventricular ejection fraction, by estimation, is 55 to 60%. The left ventricle has normal function. The left ventricle has no regional wall motion abnormalities. There is mild concentric left ventricular hypertrophy. Left ventricular diastolic parameters are consistent with Grade I diastolic dysfunction (impaired relaxation).  2. Right ventricular systolic function is normal. The right ventricular size is normal.  3. The mitral valve is normal in structure. No evidence of mitral valve regurgitation. No evidence of mitral stenosis.  4. The aortic valve is tricuspid. Aortic valve regurgitation is not visualized. Aortic valve sclerosis/calcification is present, without any evidence of aortic stenosis. Comparison(s): No significant change from prior study. Prior images reviewed side by side. FINDINGS  Left Ventricle: Left ventricular ejection fraction, by estimation, is 55 to 60%. The left ventricle has normal function. The left ventricle has no regional wall motion abnormalities. The left ventricular internal cavity size was normal in size. There is  mild concentric left ventricular hypertrophy. Left ventricular diastolic parameters are consistent with Grade I diastolic dysfunction (impaired relaxation). Right Ventricle: The right ventricular size is normal. No increase in right ventricular wall thickness. Right ventricular systolic function is normal. Left Atrium: Left atrial size was normal in size. Right Atrium: Right atrial size was normal in size. Pericardium: Trivial pericardial effusion is present. The pericardial effusion is anterior to the right ventricle. Presence of epicardial fat layer. Mitral Valve: The mitral valve is normal in structure. No evidence of mitral valve regurgitation. No evidence of mitral valve  stenosis. Tricuspid Valve: The tricuspid valve is normal in structure. Tricuspid valve regurgitation is not demonstrated. No evidence of tricuspid stenosis. Aortic Valve: The aortic valve is tricuspid. Aortic valve regurgitation is not visualized. Aortic valve sclerosis/calcification is present, without any evidence of aortic stenosis. Aortic valve mean gradient measures 5.0 mmHg. Aortic valve peak gradient measures 10.2 mmHg. Aortic valve area, by VTI measures 2.77 cm. Pulmonic Valve: The pulmonic valve was normal in structure. Pulmonic valve regurgitation is not visualized. No evidence of pulmonic stenosis. Aorta: The aortic root and ascending aorta are structurally normal, with no evidence of dilitation. IAS/Shunts: The atrial septum is grossly normal.  LEFT VENTRICLE PLAX 2D LVIDd:         4.40 cm   Diastology LVIDs:         3.10 cm   LV e' medial:    9.57 cm/s LV PW:         1.20 cm   LV E/e' medial:  10.0 LV IVS:        1.20 cm   LV e' lateral:   7.72 cm/s LVOT diam:     2.10 cm   LV E/e' lateral: 12.4 LV SV:         79 LV SV Index:   37 LVOT Area:     3.46 cm  RIGHT VENTRICLE RV S prime:     16.20 cm/s TAPSE (M-mode): 2.1 cm AORTIC VALVE AV Area (Vmax):    2.75 cm AV Area (Vmean):   2.45 cm AV Area (VTI):     2.77 cm AV Vmax:           160.00 cm/s AV Vmean:          101.000 cm/s AV VTI:            0.284 m AV Peak Grad:      10.2 mmHg AV Mean Grad:      5.0 mmHg LVOT Vmax:  127.00 cm/s LVOT Vmean:        71.300 cm/s LVOT VTI:          0.227 m LVOT/AV VTI ratio: 0.80  AORTA Ao Root diam: 2.80 cm Ao Asc diam:  3.20 cm MITRAL VALVE MV Area (PHT): 5.20 cm    SHUNTS MV Decel Time: 146 msec    Systemic VTI:  0.23 m MV E velocity: 95.90 cm/s  Systemic Diam: 2.10 cm MV A velocity: 90.10 cm/s MV E/A ratio:  1.06 Stanly Leavens MD Electronically signed by Stanly Leavens MD Signature Date/Time: 08/24/2023/12:53:50 PM    Final     {Document cardiac monitor, telemetry assessment procedure when  appropriate:32947} Procedures   Medications Ordered in the ED  HYDROmorphone  (DILAUDID ) injection 1 mg (has no administration in time range)  LORazepam  (ATIVAN ) injection 1 mg (has no administration in time range)  droperidol  (INAPSINE ) 2.5 MG/ML injection 1.25 mg (1.25 mg Intravenous Given 08/26/23 0342)      {Click here for ABCD2, HEART and other calculators REFRESH Note before signing:1}                              Medical Decision Making Amount and/or Complexity of Data Reviewed Radiology: ordered.  Risk Prescription drug management.  Will get MRIs to rule out MS or spinal impingement although I think these are low but less likely especially with associated swelling but she does have a history of multiple episodes over multiple times and she also has a history of back surgery so make sure there is nothing associated with those.  If those are negative and her significant negative workup previously I think complex regional pain syndrome is high on the differential.  Unfortunately there is not a lot of treatment for this and though definitive diagnostic test.  If her workup is negative we will have a discussion with her by do not see another reason for her symptoms but I also see any reason for admitting her to the hospital at this time. ***  {Document critical care time when appropriate  Document review of labs and clinical decision tools ie CHADS2VASC2, etc  Document your independent review of radiology images and any outside records  Document your discussion with family members, caretakers and with consultants  Document social determinants of health affecting pt's care  Document your decision making why or why not admission, treatments were needed:32947:::1}   Final diagnoses:  None    ED Discharge Orders     None

## 2023-08-26 NOTE — Discharge Instructions (Signed)
 Please follow-up with the neurosurgeon in the office.

## 2023-08-26 NOTE — ED Provider Notes (Signed)
 Received patient in turnover from Dr. Lorette.  Please see their note for further details of Hx, PE.  Briefly patient is a 63 y.o. female with a Leg Swelling .  Patient recently admitted for unilateral edema.  Came back after a fall.  Has been having some weakness to her left leg.  MRI imaging here with mild worsening disc disease.  I discussed the case with Lauraine Pickle PA working with Dr. Debby.  Images were evaluated, no obvious acute indication for surgical repair.  Based on my history and exam recommended steroids and following up with Dr. Mavis in the clinic.  Discussed with patient.  D/c home.    Emil Share, DO 08/26/23 [phone]

## 2023-08-27 ENCOUNTER — Other Ambulatory Visit (HOSPITAL_COMMUNITY): Payer: Self-pay

## 2023-09-02 ENCOUNTER — Inpatient Hospital Stay: Admitting: Family Medicine

## 2023-09-09 ENCOUNTER — Ambulatory Visit: Payer: Self-pay

## 2023-09-09 ENCOUNTER — Inpatient Hospital Stay: Admitting: Family Medicine

## 2023-09-09 NOTE — Telephone Encounter (Signed)
 FYI Only or Action Required?: FYI only for provider.  Patient was last seen in primary care on 06/01/2023 by Duanne Butler DASEN, MD.  Called Nurse Triage reporting No chief complaint on file..  Symptoms began several weeks ago.  Interventions attempted: Other: See ED documentation.  Symptoms are: rapidly worsening.  Triage Disposition: No disposition on file.  Patient/caregiver understands and will follow disposition?:   Reason for Disposition  Sounds like a life-threatening emergency to the triager  Answer Assessment - Initial Assessment Questions Patient's husband reports that since discharged, edema of the left arm and left leg has worsened significantly and she is now dragging her left leg. Husband did not want this RN to call 911, he stated he would call right after we completed our call.   Husband wanted recorded that he does not believe his wife should have been discharged from the hospital and that staff there threatened to have her removed by security when she said she would not leave. This RN advised him that he can ask to speak to a patient advocate once at the hospital today. If unavailable, may ask to speak to a Child psychotherapist or Sports coach.  1. ONSET: When did the swelling start? (e.g., minutes, hours, days)     See previous ED notes starting 7/18  2. LOCATION: What part of the leg is swollen?  Are both legs swollen or just one leg?     Left leg and left arm  3. SEVERITY: How bad is the swelling? (e.g., localized; mild, moderate, severe)     Severe  Protocols used: Leg Swelling and Edema-A-AH  Copied from CRM #[id number]. Topic: Clinical - Red Word Triage >> Sep 09, 2023 10:46 AM Leonette SQUIBB wrote: Red Word that prompted transfer to Nurse Triage: I have patients husband on the phone.  She has a hospital fu today but he says she is so sick she can't get out of the bed.  She was released from the hospital recently and has went down hill since.  Husband wants to know  what to do

## 2023-09-14 ENCOUNTER — Other Ambulatory Visit: Payer: Self-pay | Admitting: Family Medicine

## 2023-09-14 ENCOUNTER — Other Ambulatory Visit: Payer: Self-pay | Admitting: Neurology

## 2023-09-14 DIAGNOSIS — I10 Essential (primary) hypertension: Secondary | ICD-10-CM

## 2023-09-17 ENCOUNTER — Inpatient Hospital Stay: Admitting: Family Medicine

## 2023-09-21 ENCOUNTER — Inpatient Hospital Stay: Admitting: Family Medicine

## 2023-09-24 ENCOUNTER — Inpatient Hospital Stay: Admitting: Family Medicine

## 2023-10-01 ENCOUNTER — Inpatient Hospital Stay: Admitting: Family Medicine

## 2023-10-07 ENCOUNTER — Telehealth: Payer: Self-pay | Admitting: Family Medicine

## 2023-10-07 ENCOUNTER — Inpatient Hospital Stay: Admitting: Family Medicine

## 2023-10-07 NOTE — Telephone Encounter (Unsigned)
 Copied from CRM [id number]. Topic: Clinical - Medical Advice >> Oct 07, 2023 11:49 AM Tiffini S wrote: Reason for CRM: Patient called to reschedule appointment/ fall and is in a lot of pain- cannot come to appointment today/ asked to talk nurse Ronal Bradley. Called CAL, Delon- sent to secure chat message asking for call back to patient.  Please call the patient at [phone].

## 2023-10-14 ENCOUNTER — Inpatient Hospital Stay: Admitting: Family Medicine

## 2023-10-14 ENCOUNTER — Ambulatory Visit: Payer: Self-pay

## 2023-10-14 NOTE — Telephone Encounter (Signed)
 FYI Only or Action Required?: Action required by provider: Pt not received call back for earlier triage call routed, this nurse advised ED, unsure if pt will go.  Patient was last seen in primary care on 06/01/2023 by Duanne Butler DASEN, MD.  Called Nurse Triage reporting Fall, swollen finger, finger red and blue, and Hand Pain.  Symptoms began several days ago.  Interventions attempted: Other: tried to get hospital to cut ring off, PCP office has no ring cutter, asked fire dept and jeweler.  Symptoms are: rapidly worsening.  Triage Disposition: Go to ED Now (overriding Call PCP Now)  Patient/caregiver understands and will follow disposition?: Unsure      Reason for Disposition  Caller has already spoken with another triager or doctor (or NP/PA, pharmacist) AND has further questions AND triager able to answer questions.  Answer Assessment - Initial Assessment Questions Pt confirms talked to other triage nurse earlier, not feeling worse than did when triaged but was told would receive a call back   We've asked everybody to get rings removed, swollen but didn't cut through finger, was in hospital for over a week, Becky told me ED or  EMS picked me up at my house, took me to hospital, admitted me for over a week, kept begging for rings to be cut off, so swollen it hurt, can't move it Swollen got a cut in it by the ring Hurts like crap Refused ED earlier, already in the hospital for a week and they wouldn't do it so why would I go to ED Finger red and has turned blue a little bit at [place] would have someone call me or she'd call me back in 30 min or so, no call back received Jeweler's not gonna do it Medical illustrator won't do it    Advised ED, especially if any numbness or tingling, as they should have ring cutter. Pt stated understanding, did not confirm or deny intent to go. Pt requesting call back from office as well.  Protocols used: No Contact or Duplicate Contact  Call-A-AH

## 2023-10-14 NOTE — Telephone Encounter (Signed)
 FYI Only or Action Required?: Action required by provider: update on patient condition.  Patient was last seen in primary care on 06/01/2023 by Duanne Butler DASEN, MD.  Called Nurse Triage reporting Fall.  Symptoms began several weeks ago.  Interventions attempted: Nothing.  Symptoms are: unchanged.  Triage Disposition: Call PCP Now-canceled follow up appointment for today-needing follow up call.   Patient/caregiver understands and will follow disposition?: No, wishes to speak with PCP  Copied from CRM #[id number]. Topic: Clinical - Red Word Triage >> Oct 14, 2023 11:06 AM Deborah Jennings wrote: Red Word that prompted transfer to Nurse Triage: Deborah Jennings on Tuesday, need her rings cut off her fingers, fingers are swollen, Back pain. Hospital refused to help her and told her she needed to get out or security would be called. She has hospital follow up today at 3pm. Reason for Disposition  [1] Caller has URGENT question AND [2] triager unable to answer question  Answer Assessment - Initial Assessment Questions 1. MECHANISM: How did the fall happen?     Patient states her foot gave out from under her and she fell 2. DOMESTIC VIOLENCE AND ELDER ABUSE SCREENING: Did you fall because someone pushed you or tried to hurt you? If Yes, ask: Are you safe now?     no 3. ONSET: When did the fall happen? (e.g., minutes, hours, or days ago)     Fall occurred on Tuesday night 4. LOCATION: What part of the body hit the ground? (e.g., back, buttocks, head, hips, knees, hands, head, stomach)     Left side of body.  5. INJURY: Did you hurt (injure) yourself when you fell? If Yes, ask: What did you injure? Tell me more about this? (e.g., body area; type of injury; pain severity)     No visible injuries 6. PAIN: Is there any pain? If Yes, ask: How bad is the pain? (e.g., Scale 0-10; or none, mild,      9-10 7. SIZE: For cuts, bruises, or swelling, ask: How large is it? (e.g., inches or centimeters)       NA 9. OTHER SYMPTOMS: Do you have any other symptoms? (e.g., dizziness, fever, weakness; new-onset or worsening).      no 10. CAUSE: What do you think caused the fall (or falling)? (e.g., dizzy spell, tripped)       Patient states her leg went out from under her.  Patient calling to report falling this past Tuesday-states her leg went out from under her. Endorses continued back pain. Patient reports swelling to left hand to the point that her rings are stuck on her fingers. Patient states the last time she was in the Emergency Department, she asked to have the rings cut off for her finger but states staff refused. CAL was called-office doesn't have a ring cutter. Patient was scheduled for hospital follow up but she and her husband canceled for today due to pain. Patient states she isn't sleeping-reports medication she is currently on isn't helping. Asking for medication. Patient is needing a follow up call from office.  Protocols used: Falls and [facility]

## 2023-10-15 ENCOUNTER — Ambulatory Visit: Payer: Self-pay

## 2023-10-15 ENCOUNTER — Encounter: Payer: Self-pay | Admitting: Family Medicine

## 2023-10-15 NOTE — Telephone Encounter (Signed)
 FYI Only or Action Required?: Action required by provider: update on patient condition.  Patient was last seen in primary care on 06/01/2023 by Duanne Butler DASEN, MD.  Called Nurse Triage reporting Back Pain.  Symptoms began about a month ago.  Interventions attempted: OTC medications: tylenol .  Symptoms are: unchanged.  Triage Disposition: See HCP Within 4 Hours (Or PCP Triage)  Patient/caregiver understands and will follow disposition?: Unsure  Copied from CRM #[id number]. Topic: Clinical - Red Word Triage >> Oct 15, 2023  5:03 PM Donee H wrote: Red Word that prompted transfer to Nurse Triage: Patient is stating is still in a lot of pain in back. She states barely can walk and has to hold on to the wall to move around. She is stating need something for the pain. Reason for Disposition  [1] SEVERE back pain (e.g., excruciating, unable to do any normal activities) AND [2] not improved 2 hours after pain medicine  Answer Assessment - Initial Assessment Questions Patient saddened to know  that she has been dismissed from practice.  Patient agreeable to virtual appointment due to transportation issues   1. ONSET: When did the pain begin? (e.g., minutes, hours, days)     A little over one month ago 2. LOCATION: Where does it hurt? (upper, mid or lower back)     Low back, middle 3. SEVERITY: How bad is the pain?  (e.g., Scale 1-10; mild, moderate, or severe)     Sever pain 4. PATTERN: Is the pain constant? (e.g., yes, no; constant, intermittent)      constant 5. RADIATION: Does the pain shoot into your legs or somewhere else?     Radiating down right leg 6. CAUSE:  What do you think is causing the back pain?      Not sure why back pain has flared up 7. BACK OVERUSE:  Any recent lifting of heavy objects, strenuous work or exercise?     N/A 8. MEDICINES: What have you taken so far for the pain? (e.g., nothing, acetaminophen , NSAIDS)     Presently taking tylenol  for  pain 9. NEUROLOGIC SYMPTOMS: Do you have any weakness, numbness, or problems with bowel/bladder control?     Weakness, left leg with numbness and pain 10. OTHER SYMPTOMS: Do you have any other symptoms? (e.g., fever, abdomen pain, burning with urination, blood in urine)       denies 11. PREGNANCY: Is there any chance you are pregnant? When was your last menstrual period?       N/A  Protocols used: Back Pain-A-AH

## 2023-10-18 ENCOUNTER — Ambulatory Visit: Payer: Self-pay | Admitting: *Deleted

## 2023-10-18 NOTE — Telephone Encounter (Signed)
 Copied from CRM [id number]. Topic: Clinical - Red Word Triage >> Oct 18, 2023  2:57 PM Deborah Jennings wrote: [facility] that prompted transfer to Nurse Triage: Patient calling states she was informed she was discharged from office, states she doesn'Jennings know why, but she is calling to report that she is having severe pain due to a fall on last week.  Patient is seeking any medical advice that can be given due to increased back pain. Reason for Disposition  [1] SEVERE back pain (e.g., excruciating, unable to do any normal activities) AND [2] not improved 2 hours after pain medicine  Answer Assessment - Initial Assessment Questions 1. ONSET: When did the pain begin? (e.g., minutes, hours, days)     She has been dismissed from the office as of Friday.   Because I missed some of my appts. Is what I was told when I called on Friday.  I fell Wed. On my left leg.   I've had a stroke.   My left leg would not move.   My left arm was hanging there.  My left leg gave out.  I fell flat on my face.     We called EMS.   My husband could not get me up. The office told me I had missed too many appts and I was no longer a pt.     It was hurtful.   I've been seeing Dr. Duanne for a long time.  2. LOCATION: Where does it hurt? (upper, mid or lower back)     My back is killing me right now.   I can'Jennings sleep.   I can'Jennings sleep from the medication I'm on.   I need some help.   I'm going to Dr. Mavis about my back.   3. SEVERITY: How bad is the pain?  (e.g., Scale 1-10; mild, moderate, or severe)     Severe back pain 4. PATTERN: Is the pain constant? (e.g., yes, no; constant, intermittent)      Constant  5. RADIATION: Does the pain shoot into your legs or somewhere else?     I'm in the bed because I can'Jennings hardly move.    6. CAUSE:  What do you think is causing the back pain?      I fell on wed. 7. BACK OVERUSE:  Any recent lifting of heavy objects, strenuous work or exercise?     Fell on Wed. 8. MEDICINES: What  have you taken so far for the pain? (e.g., nothing, acetaminophen , NSAIDS)     Not asked 9. NEUROLOGIC SYMPTOMS: Do you have any weakness, numbness, or problems with bowel/bladder control?     I'm in the bed I can'Jennings hardly move. 10. OTHER SYMPTOMS: Do you have any other symptoms? (e.g., fever, abdomen pain, burning with urination, blood in urine)       Not asked 11. PREGNANCY: Is there any chance you are pregnant? When was your last menstrual period?       N/A due to age  Protocols used: Back Pain-A-AH Pt has been dismissed from [facility] due to too many missed appts.   Requesting to remain a pt of Dr. Clara.        FYI Only or Action Required?: Action required by provider: request for appointment.  Patient was last seen in primary care on 06/01/2023 by Duanne Jennings DASEN, MD. Pt has been dismissed from [facility] due to too many missed appts.   Requesting to be accepted  as a pt again.   Called Nurse Triage reporting Back Pain.Fell on Wed. And is c/o severe back pain.   In bed, can'Jennings hardly move.  Symptoms began several days ago.Wed. When she fell  Interventions attempted: Rest, hydration, or home remedies.  Symptoms are: gradually worsening.  Triage Disposition: See HCP Within 4 Hours (Or PCP Triage) Referred to the urgent care since she has been dismissed from the practice.  She is requesting a message be sent to Dr. Duanne.   Would like to remain a pt there.    Husband also asking if he has been dismissed also.   Deborah Jennings.     Patient/caregiver understands and will follow disposition?: No, wishes to speak with PCP

## 2023-10-19 ENCOUNTER — Other Ambulatory Visit: Payer: Self-pay | Admitting: Family Medicine

## 2023-10-19 ENCOUNTER — Telehealth (INDEPENDENT_AMBULATORY_CARE_PROVIDER_SITE_OTHER): Payer: Self-pay | Admitting: Family Medicine

## 2023-10-19 DIAGNOSIS — M5030 Other cervical disc degeneration, unspecified cervical region: Secondary | ICD-10-CM

## 2023-10-19 MED ORDER — MELOXICAM 15 MG PO TABS: 15.0000 mg | ORAL_TABLET | Freq: Every day | ORAL | 0 refills | Status: AC

## 2023-10-19 MED ORDER — CYCLOBENZAPRINE HCL 10 MG PO TABS: 10.0000 mg | ORAL_TABLET | Freq: Three times a day (TID) | ORAL | 0 refills | Status: AC | PRN

## 2023-10-19 NOTE — Telephone Encounter (Signed)
 Have tried to call both pt's home and cell numbers. No answer or vm on home #. Have left 2 voice mails on cell phone and sent My Chart message to make sure pt is aware of appointment today at 3:45. Mjp,lpn

## 2023-10-19 NOTE — Progress Notes (Signed)
 Subjective:    Patient ID: Deborah Jennings, female    DOB: 1960-09-19, 63 y.o.   MRN: [id number]  HPI Patient is being seen today as a telephone visit.  Initially patient was scheduled as a video visit.  However her camera was not working and her telephone was muted.  Therefore we were unable to communicate.  As result I called the patient.  Phone call began at 343.  Phone call concluded at 359.  Patient consents to be seen via telephone.  Encounter was very difficult.  Patient was hospitalized in July for left arm and left leg weakness.  MRI of the brain showed no stroke.  MRI of the cervical spine did show a disc osteophyte complex at C5-6 and 7 potentially effacing the thecal sac.  There was also possible severe foraminal narrowing at that level.  Thoracic MRI was unremarkable.  Lumbar spine MRI showed degenerative disc disease but no severe nerve impingement.  Patient was discharged from the hospital with pain medication and was scheduled to follow-up with neurosurgery later this week.  We have discharged her from this clinic.  This was done due to failure to keep appointments.  In the last 6 weeks, the patient has scheduled 8, 30-minute appointments for hospital follow-up.  She has canceled the same day 8 occasions.  This accounts for over 4 hours of office time preventing me from seeing other patients.  Therefore we have discharged the patient from clinic.  We are giving the patient a 30-day grace.  For emergency care while she establishes with another PCP.  Patient was very distraught and upset on the telephone.  She was crying, difficult to understand, and extremely emotional.  She states that no one told her while her left arm and left leg are weak.  She states that she was forced to leave the hospital.  I explained to the patient that I am unable to evaluate the patient without her coming to the clinic.  This is impossible to do over the telephone however I did review the MRI and perhaps the  nerve impingement in the cervical spine could cause some nerve damage affecting her arm and leg.  However the majority of her back pain is in the lower back.  She states that the emergency room gave her a Medrol  Dosepak which helped temporarily and 5 oxycodone .  She also inquired about switching to Xanax  for anxiety and insomnia.  I explained to the patient that I would not continue any chronic medication as I would no longer leave her dog for 30 days.  Today's visit was only to address her emergency issue Past Medical History:  Diagnosis Date   ASCUS of cervix with negative high risk HPV 04/2017   Asthma    Back pain    low back pain   Endometriosis    IBS (irritable bowel syndrome)    Migraine    Past Surgical History:  Procedure Laterality Date   ABDOMINAL HYSTERECTOMY     RSO   CESAREAN SECTION     DILATION AND CURETTAGE OF UTERUS     ganglion cyst removal Right    foot   lower back surgery     FUSION AND DISC REMOVED   OOPHORECTOMY     RSO   PELVIC LAPAROSCOPY     Lysis of adhesions-Endometriosis   Current Outpatient Medications on File Prior to Visit  Medication Sig Dispense Refill   acetaminophen  (TYLENOL ) 500 MG tablet Take 1,000 mg by mouth  2 (two) times daily as needed for moderate pain (pain score 4-6) or headache.     clonazePAM  (KLONOPIN ) 0.5 MG tablet TAKE 1 TABLET BY MOUTH THREE TIMES DAILY AS NEEDED FOR ANXIETY 90 tablet 0   dicyclomine  (BENTYL ) 20 MG tablet Take 1 tablet (20 mg total) by mouth every 6 (six) hours as needed for spasms. (Patient taking differently: Take 20 mg by mouth See admin instructions. Take 1 tablet (20mg ) by mouth twice daily but may take 1 tablet up to every 6 hours if needed for IBS.) 120 tablet 2   escitalopram  (LEXAPRO ) 20 MG tablet Take 1 tablet by mouth once daily 90 tablet 2   furosemide  (LASIX ) 40 MG tablet Take 1 tablet (40 mg total) by mouth daily.     gabapentin  (NEURONTIN ) 300 MG capsule Take 1 capsule (300 mg total) by mouth 3  (three) times daily. (Patient taking differently: Take 300 mg by mouth 2 (two) times daily.) 90 capsule 3   methylPREDNISolone  (MEDROL  DOSEPAK) 4 MG TBPK tablet Day 1: 8mg  before breakfast, 4 mg after lunch, 4 mg after supper, and 8 mg at bedtime Day 2: 4 mg before breakfast, 4 mg after lunch, 4 mg  after supper, and 8 mg  at bedtime Day 3:  4 mg  before breakfast, 4 mg  after lunch, 4 mg after supper, and 4 mg  at bedtime Day 4: 4 mg  before breakfast, 4 mg  after lunch, and 4 mg at bedtime Day 5: 4 mg  before breakfast and 4 mg at bedtime Day 6: 4 mg  before breakfast 1 each 0   Multiple Vitamins-Minerals (MULTIVITAMIN WOMEN 50+) TABS Take 1 tablet by mouth daily.     omeprazole  (PRILOSEC  OTC) 20 MG tablet Take 20 mg by mouth daily as needed (heartburn).     ondansetron  (ZOFRAN -ODT) 4 MG disintegrating tablet Take 4 mg by mouth every 8 (eight) hours as needed for nausea or vomiting.     oxyCODONE  (OXY IR/ROXICODONE ) 5 MG immediate release tablet Take 1 tablet (5 mg total) by mouth every 6 (six) hours as needed for moderate pain (pain score 4-6). 10 tablet 0   Rimegepant Sulfate (NURTEC) 75 MG TBDP Take 75 mg by mouth daily as needed (migraine).     rizatriptan  (MAXALT -MLT) 10 MG disintegrating tablet Take 10 mg by mouth daily as needed for migraine. May repeat dose in two hour if headache persists or recurs. Do not exceed 2 tablets in 24 hours.     rosuvastatin  (CRESTOR ) 10 MG tablet Take 1 tablet by mouth once daily 90 tablet 0   SUMAtriptan  (IMITREX ) 50 MG tablet Take 1 tablet (50 mg total) by mouth every 2 (two) hours as needed for migraine or headache. May repeat in 2 hours if headache persists or recurs. 20 tablet 0   valACYclovir  (VALTREX ) 500 MG tablet Take 500 mg by mouth daily.     No current facility-administered medications on file prior to visit.   Allergies  Allergen Reactions   Omnipen [Ampicillin] Hives and Swelling    OK with penicillin   Social History   Socioeconomic History    Marital status: Married    Spouse name: Not on file   Number of children: Not on file   Years of education: Not on file   Highest education level: Not on file  Occupational History   Not on file  Tobacco Use   Smoking status: Former    Current packs/day: 0.00    Types:  Cigarettes    Quit date: 60    Years since quitting: 39.7   Smokeless tobacco: Never  Vaping Use   Vaping status: Never Used  Substance and Sexual Activity   Alcohol use: Yes    Alcohol/week: 5.0 standard drinks of alcohol    Types: 5 Standard drinks or equivalent per week   Drug use: Never   Sexual activity: Yes    Birth control/protection: Surgical    Comment: 1st intercourse 63 yo-More than 5 partners  Other Topics Concern   Not on file  Social History Narrative   Caffeine : 2 cups tea some days   Right handed   Lives at home with husband and their two Chihuahuas    Social Drivers of Health   Financial Resource Strain: Not on file  Food Insecurity: Food Insecurity Present (08/21/2023)   Hunger Vital Sign    Worried About Running Out of Food in the Last Year: Sometimes true    Ran Out of Food in the Last Year: Sometimes true  Transportation Needs: Unmet Transportation Needs (08/21/2023)   PRAPARE - Administrator, Civil Service (Medical): Yes    Lack of Transportation (Non-Medical): Yes  Physical Activity: Not on file  Stress: Not on file  Social Connections: Not on file  Intimate Partner Violence: Not At Risk (08/21/2023)   Humiliation, Afraid, Rape, and Kick questionnaire    Fear of Current or Ex-Partner: No    Emotionally Abused: No    Physically Abused: No    Sexually Abused: No      Review of Systems  Musculoskeletal:  Positive for back pain, gait problem and neck pain.  Neurological:  Positive for weakness.       Objective:   Physical Exam        Assessment & Plan:   DDD (degenerative disc disease), cervical  I was unable to perform a physical exam because the  patient was unable to come to clinic.  I reiterated our clinic policy.  She will continue to be discharged from the clinic in 30 days.  Will provide 30 days of emergency care.  However, I do not feel that I can provide good health care to the patient given her inability to come to clinic or keep appointments.  I will start the patient on meloxicam  15 mg daily for her cervical degenerative disc disease and lumbar degenerative disc disease.  She can also use Flexeril  10 mg every 8 hours as needed for muscle pain in her lower back.  I will not prescribe narcotics nor will continue benzodiazepines as I am concerned about possible  misuse.  She has an appointment to see neurosurgery on Friday.  I strongly encouraged her to keep that appointment.

## 2023-10-21 ENCOUNTER — Other Ambulatory Visit: Payer: Self-pay | Admitting: Family Medicine

## 2023-10-21 ENCOUNTER — Other Ambulatory Visit: Payer: Self-pay | Admitting: Neurology

## 2023-10-21 DIAGNOSIS — L989 Disorder of the skin and subcutaneous tissue, unspecified: Secondary | ICD-10-CM

## 2023-10-21 DIAGNOSIS — I10 Essential (primary) hypertension: Secondary | ICD-10-CM

## 2023-10-22 NOTE — Telephone Encounter (Signed)
 Requested medications are due for refill today.  unsure  Requested medications are on the active medications list.  Valacyclovir  is historical and valsartan  in not on med list  Last refill. unsure  Future visit scheduled.   no  Notes to clinic.  Please review for refill.    Requested Prescriptions  Pending Prescriptions Disp Refills   valACYclovir  (VALTREX ) 500 MG tablet [Pharmacy Med Name: valACYclovir  HCl 500 MG Oral Tablet] 90 tablet 0    Sig: Take 1 tablet by mouth once daily     Antimicrobials:  Antiviral Agents - Anti-Herpetic Passed - 10/22/2023  1:26 PM      Passed - Valid encounter within last 12 months    Recent Outpatient Visits           3 days ago DDD (degenerative disc disease), cervical   [place] [facility] Duanne Butler DASEN, MD   4 months ago Benign essential HTN   [place] Duanne Butler DASEN, MD   1 year ago Chronic migraine without aura without status migrainosus, not intractable   [facility] Duanne Butler DASEN, MD   1 year ago New-onset angina [facility])   [place] [facility] Duanne Butler DASEN, MD   1 year ago Chronic migraine without aura without status migrainosus, not intractable   [facility] Pickard, Butler DASEN, MD               valsartan  (DIOVAN ) 160 MG tablet [Pharmacy Med Name: Valsartan  160 MG Oral Tablet] 30 tablet 0    Sig: Take 1 tablet by mouth once daily     Cardiovascular:  Angiotensin Receptor Blockers Passed - 10/22/2023  1:26 PM      Passed - Cr in normal range and within 180 days    Creat  Date Value Ref Range Status  06/01/2023 0.93 0.50 - 1.05 mg/dL Final   Creatinine, Ser  Date Value Ref Range Status  08/25/2023 0.85 0.44 - 1.00 mg/dL Final         Passed - K in normal range and within 180 days    Potassium  Date Value Ref Range Status  08/25/2023 4.0 3.5 - 5.1 mmol/L Final    Comment:     HEMOLYSIS AT THIS LEVEL MAY AFFECT RESULT         Passed - Patient is not pregnant      Passed - Last BP in normal range    BP Readings from Last 1 Encounters:  08/26/23 132/89         Passed - Valid encounter within last 6 months    Recent Outpatient Visits           3 days ago DDD (degenerative disc disease), cervical   [facility] Duanne Butler DASEN, MD   4 months ago Benign essential HTN   [facility] Duanne Butler DASEN, MD   1 year ago Chronic migraine without aura without status migrainosus, not intractable   [facility] Duanne, Butler DASEN, MD   1 year ago New-onset angina [facility])    [facility] Duanne Butler DASEN, MD   1 year ago Chronic migraine without aura without status migrainosus, not intractable    [facility] Pickard, Butler DASEN, MD

## 2023-10-25 ENCOUNTER — Other Ambulatory Visit: Payer: Self-pay | Admitting: Family Medicine

## 2023-11-12 ENCOUNTER — Other Ambulatory Visit: Payer: Self-pay | Admitting: Family Medicine

## 2023-11-22 ENCOUNTER — Other Ambulatory Visit: Payer: Self-pay | Admitting: Family Medicine

## 2023-11-22 DIAGNOSIS — E7800 Pure hypercholesterolemia, unspecified: Secondary | ICD-10-CM

## 2023-12-14 ENCOUNTER — Other Ambulatory Visit: Payer: Self-pay | Admitting: Family Medicine
